# Patient Record
Sex: Male | Born: 1940 | Race: White | Hispanic: No | Smoking: Former smoker
Health system: Southern US, Community
[De-identification: ages and names within clinical notes are randomized; demographics above are authoritative.]

## PROBLEM LIST (undated history)

## (undated) DIAGNOSIS — Q613 Polycystic kidney, unspecified: Secondary | ICD-10-CM

## (undated) DIAGNOSIS — E559 Vitamin D deficiency, unspecified: Secondary | ICD-10-CM

## (undated) DIAGNOSIS — R0602 Shortness of breath: Secondary | ICD-10-CM

## (undated) DIAGNOSIS — J449 Chronic obstructive pulmonary disease, unspecified: Secondary | ICD-10-CM

## (undated) DIAGNOSIS — I2510 Atherosclerotic heart disease of native coronary artery without angina pectoris: Secondary | ICD-10-CM

## (undated) DIAGNOSIS — F32A Depression, unspecified: Secondary | ICD-10-CM

## (undated) DIAGNOSIS — C3491 Malignant neoplasm of unspecified part of right bronchus or lung: Secondary | ICD-10-CM

## (undated) DIAGNOSIS — M1990 Unspecified osteoarthritis, unspecified site: Secondary | ICD-10-CM

## (undated) DIAGNOSIS — G4730 Sleep apnea, unspecified: Secondary | ICD-10-CM

## (undated) DIAGNOSIS — I209 Angina pectoris, unspecified: Secondary | ICD-10-CM

## (undated) DIAGNOSIS — I10 Essential (primary) hypertension: Secondary | ICD-10-CM

## (undated) DIAGNOSIS — F329 Major depressive disorder, single episode, unspecified: Secondary | ICD-10-CM

## (undated) DIAGNOSIS — E785 Hyperlipidemia, unspecified: Secondary | ICD-10-CM

## (undated) HISTORY — PX: ANGIOPLASTY: SHX39

## (undated) HISTORY — DX: Malignant neoplasm of unspecified part of right bronchus or lung: C34.91

## (undated) HISTORY — DX: Chronic obstructive pulmonary disease, unspecified: J44.9

## (undated) HISTORY — PX: VAGOTOMY: SUR1431

## (undated) HISTORY — DX: Depression, unspecified: F32.A

## (undated) HISTORY — PX: HERNIA REPAIR: SHX51

## (undated) HISTORY — PX: SPINAL CORD STIMULATOR IMPLANT: SHX2422

## (undated) HISTORY — DX: Hyperlipidemia, unspecified: E78.5

## (undated) HISTORY — PX: GASTRECTOMY: SHX58

## (undated) HISTORY — PX: CHOLECYSTECTOMY: SHX55

## (undated) HISTORY — DX: Atherosclerotic heart disease of native coronary artery without angina pectoris: I25.10

## (undated) HISTORY — DX: Sleep apnea, unspecified: G47.30

## (undated) HISTORY — DX: Essential (primary) hypertension: I10

## (undated) HISTORY — PX: BACK SURGERY: SHX140

## (undated) HISTORY — DX: Major depressive disorder, single episode, unspecified: F32.9

## (undated) HISTORY — DX: Vitamin D deficiency, unspecified: E55.9

## (undated) HISTORY — PX: NISSEN FUNDOPLICATION: SHX2091

## (undated) HISTORY — PX: CARDIAC CATHETERIZATION: SHX172

## (undated) HISTORY — PX: CARPAL TUNNEL RELEASE: SHX101

## (undated) HISTORY — PX: APPENDECTOMY: SHX54

---

## 2006-08-19 ENCOUNTER — Encounter: Admission: RE | Admit: 2006-08-19 | Discharge: 2006-08-19 | Payer: Self-pay | Admitting: Specialist

## 2006-09-17 ENCOUNTER — Encounter: Admission: RE | Admit: 2006-09-17 | Discharge: 2006-09-17 | Payer: Self-pay | Admitting: Specialist

## 2006-10-07 ENCOUNTER — Encounter: Admission: RE | Admit: 2006-10-07 | Discharge: 2006-10-07 | Payer: Self-pay | Admitting: Specialist

## 2006-11-25 ENCOUNTER — Ambulatory Visit: Admission: RE | Admit: 2006-11-25 | Discharge: 2006-11-25 | Payer: Self-pay | Admitting: Specialist

## 2006-11-26 ENCOUNTER — Ambulatory Visit: Payer: Self-pay | Admitting: Cardiology

## 2006-11-30 ENCOUNTER — Ambulatory Visit: Payer: Self-pay

## 2006-12-02 ENCOUNTER — Ambulatory Visit: Payer: Self-pay | Admitting: Cardiology

## 2006-12-02 LAB — CONVERTED CEMR LAB
BUN: 15 mg/dL (ref 6–23)
Basophils Absolute: 0 10*3/uL (ref 0.0–0.1)
Basophils Relative: 0.4 % (ref 0.0–1.0)
CO2: 28 meq/L (ref 19–32)
Calcium: 9.6 mg/dL (ref 8.4–10.5)
Chloride: 110 meq/L (ref 96–112)
Creatinine, Ser: 0.9 mg/dL (ref 0.4–1.5)
Eosinophils Absolute: 0.5 10*3/uL (ref 0.0–0.6)
Eosinophils Relative: 5.3 % — ABNORMAL HIGH (ref 0.0–5.0)
GFR calc Af Amer: 109 mL/min
GFR calc non Af Amer: 90 mL/min
Glucose, Bld: 103 mg/dL — ABNORMAL HIGH (ref 70–99)
HCT: 36.2 % — ABNORMAL LOW (ref 39.0–52.0)
Hemoglobin: 12.1 g/dL — ABNORMAL LOW (ref 13.0–17.0)
INR: 0.9 (ref 0.8–1.0)
Lymphocytes Relative: 37.2 % (ref 12.0–46.0)
MCHC: 33.3 g/dL (ref 30.0–36.0)
MCV: 101 fL — ABNORMAL HIGH (ref 78.0–100.0)
Monocytes Absolute: 0.7 10*3/uL (ref 0.2–0.7)
Monocytes Relative: 8.6 % (ref 3.0–11.0)
Neutro Abs: 4.1 10*3/uL (ref 1.4–7.7)
Neutrophils Relative %: 48.5 % (ref 43.0–77.0)
Platelets: 364 10*3/uL (ref 150–400)
Potassium: 4.6 meq/L (ref 3.5–5.1)
Prothrombin Time: 11.1 s (ref 10.9–13.3)
RBC: 3.59 M/uL — ABNORMAL LOW (ref 4.22–5.81)
RDW: 13.8 % (ref 11.5–14.6)
Sodium: 143 meq/L (ref 135–145)
WBC: 8.5 10*3/uL (ref 4.5–10.5)
aPTT: 31.1 s — ABNORMAL HIGH (ref 21.7–29.8)

## 2006-12-06 ENCOUNTER — Ambulatory Visit: Payer: Self-pay | Admitting: Cardiovascular Disease

## 2006-12-06 ENCOUNTER — Inpatient Hospital Stay (HOSPITAL_BASED_OUTPATIENT_CLINIC_OR_DEPARTMENT_OTHER): Admission: RE | Admit: 2006-12-06 | Discharge: 2006-12-06 | Payer: Self-pay | Admitting: Cardiovascular Disease

## 2006-12-14 ENCOUNTER — Inpatient Hospital Stay (HOSPITAL_COMMUNITY): Admission: RE | Admit: 2006-12-14 | Discharge: 2006-12-20 | Payer: Self-pay | Admitting: Specialist

## 2010-08-05 NOTE — Assessment & Plan Note (Signed)
[name]                            CARDIOLOGY OFFICE NOTE   NAME:Troy [name]                           MRN:          [id number]  DATE:11/26/2006                            DOB:          [birth year]    CARDIOLOGY CONSULTATION   I was asked by Dr. Vira Browns to consult on Troy [name], a 70-  year-old gentleman with coronary artery disease who needs to be cleared  for spinal stenosis surgery.   Preoperative anesthesia gained a history that Troy [name] had had previous  coronary intervention and referred him here, along with Dr. Otelia Sergeant.   HISTORY OF PRESENT ILLNESS:  His history commenced in 1989.  He  presented with unstable angina, from his description, and subsequently  had an angioplasty in [place], [place].  Details are unknown.  He has  had several catheterizations since then, and his last catheterization  was by Dr. Ladona Ridgel in [facility], September 14, 1995.  His  catheterization then showed a 70% stenosis, but no intervention was  necessary.  He has done well since that time, now 11 years since.   He is having no exertional angina, though he admits to not getting  around very well with his spinal stenosis.  He has only taken  nitroglycerin a couple of times, and that was when his wife died 2 years  ago and he was emotionally upset.   PAST MEDICAL HISTORY:  In addition to the above, he is allergic to  DOXEPIN and ISORDIL.   CURRENT MEDICATIONS:  1. Aspirin 325 a day.  2. Diazepam 10 mg p.o. t.i.d.  3. Remeron 30 mg nightly.  4. Lovastatin 20 mg a day.   He does not smoke.  He quit about 10 years ago.  He does not drink.   He has had multiple surgeries, which are listed on an enclosed sheet,  typed out by him.   His family history is really noncontributory.   His social history, he is retired on disability since 1998.  He is  widowed.  He does not have any children.   REVIEW OF SYSTEMS:  He has a history of stomach and bowel  disorder,  history of an ulcer, history of hiatal hernia, history of arthritis,  history of reflux, history of depression.   EXAM:  His blood pressure is 140/90, pulse 84 and regular.  He is normal  sinus rhythm.  His EKG is normal.  He is 6 feet tall, weighs 194 pounds.  HEENT:  Normocephalic, atraumatic.  PERRLA.  Extraocular muscles are  intact.  Sclerae clear.  SKIN:  Pale.  NECK:  Supple.  Carotids are full without bruits.  There is no JVD.  Thyroid is not enlarged.  Trachea is midline.  LUNGS:  Clear to auscultation.  HEART:  Reveals a regular rate and rhythm without gallop.  ABDOMEN:  Shows multiple incisions.  There is no midline bruit.  There  is no organomegaly or tenderness.  EXTREMITIES:  No cyanosis, clubbing, or edema.  Pulses are present.  NEURO:  Grossly intact.  SKIN:  Unremarkable.  ASSESSMENT:  1. Coronary artery disease with history of intervention in 1989.  Per      the patient, he has a 70% residual lesion by catheterization in      1997.  We have no records.  He is currently having no angina, but      is very limited in his mobility with his spinal stenosis.  He has      not had an objective assessment of his coronary artery disease in      11 years.  2. Hyperlipidemia on lovastatin.   PLAN:  Adenosine rest-stress Myoview to clear for surgery.     Thomas C. Daleen Squibb, MD, [facility]  Electronically Signed    TCW/MedQ  DD: 11/26/2006  DT: 11/26/2006  Job #: [id number]   cc:   Kerrin Champagne, M.D.

## 2010-08-05 NOTE — Op Note (Signed)
NAME:  Troy [name], Troy [name] NO.:  [id number]   MEDICAL RECORD NO.:  [id number]          PATIENT TYPE:  INP   LOCATION:  5008                         FACILITY:  [facility]   PHYSICIAN:  Kerrin Champagne, M.D.   DATE OF BIRTH:  [birth year]   DATE OF PROCEDURE:  12/14/2006  DATE OF DISCHARGE:                               OPERATIVE REPORT   PREOPERATIVE DIAGNOSIS:  Severe lumbar spinal stenosis L3-4, L4-5, mild  L2-3 with degenerative grade 1 spondylolisthesis at L3-4.   POSTOPERATIVE DIAGNOSES:  1. Severe lumbar spinal stenosis L4-5 with bilateral nerve root      entrapment.  2. Bilateral L4 nerve root entrapment within the foramen at L4-5 and      bilateral L3 nerve root entrapment within the foramen of L3-4.  3. Lateral recess stenosis effecting bilateral L4 nerve roots.  4. Degenerative grade 1 spondylolisthesis, L3-4.   PROCEDURES:  1. Central decompressive laminectomy L3-4, L4-5 carried up to the L2-3      level with bilateral lateral recess decompression at L3-4, L2-3 and      L4-5.  2. Bilateral nerve root decompression at L5.  3. Bilateral nerve root decompression at L4.  4. Bilateral nerve root decompression at L3.  5. Posterolateral fusion L3-4 for spondylolisthesis utilizing local      bone graft internal fixation L3-4 using Monarch pedicle screws and      rods, each 7 mm x 45 mm.   SURGEON:  Kerrin Champagne, M.D.   ASSISTANT:  [name], P.A.   ANESTHESIA:  General via oral tracheal intubation, Dr. Arta Bruce,  supplemented with local infiltration of Marcaine 0.5% with 1:200,000  epinephrine 20 mL.   DRAINS:  Foley to straight drain, Hemovac x1.   The patient had intraoperative neural monitoring performed with all soft  tissue resistance through the screws measured at greater than 30.  The  patient had Cell Saver used during the case, however, estimated blood  loss was 300 mL.  No Cell Saver blood was returned to the patient.   HISTORY OF  PRESENT ILLNESS:  A 70 year old male who has a history of  progressive increasing neurogenic claudication, pain into the anterior  thighs to the level of the knees and anterior groin discomfort.  The  patient has undergone extensive evaluation.  He has had ESIs with only  temporization of his discomfort.  His studies have indicated severe  lumbar spinal stenosis at L3-4, L4-5, mild at L2-3, with degenerative  slip at the L3-4 level.  He is brought to the operating room to undergo  the aforementioned procedure.  He has neurogenic claudication to the  point where he is incapable of walking any large distances beyond to the  mail box.   INTRAOPERATIVE FINDINGS:  Severe lumbar spinal stenosis centrally at L4-  5 effecting the L5 nerve roots bilaterally as well as the L4 nerve roots  within the neuroforamen.  Moderately severe lumbar spinal stenosis at L3-  5 effecting the bilateral L4 nerve roots within the later recess.  Mild  lumbar spinal stenosis at L2-3.  DESCRIPTION OF PROCEDURE:  After adequate general anesthesia, the  patient had a Foley catheter placed, standard preoperative antibiotics.  He was rolled to a prone position using Jackson spine table.  All  pressure points well padded.  He had a standard prep with DuraPrep  solution from the mid dorsal spine to the sacral segments, was draped in  the usual manner.  Iodine body drape was used.  The expected level of  the L4 marked at the superior edge of the iliac crest.  The iliac crest  was marked.  A midline incision made through the skin and subcutaneous  layers extended from the mid portion of L2 to the L5 level in the  midline.  Electrocautery used to control bleeders.  The incision carried  sharply down to the lumbodorsal fascia.  Incision carried over both  sides of the spinous process at L5, L4, L3 and L2 . clamps placed on the  spinous process of L5 and at L4.  These were marked for continued  identification.   Cautery was  used to incise the supraspinous ligament for continued  identification of these two segments.  The paralumbar muscles were then  divided of the patient's spinous process of lamina using sharp  dissection, electrocautery as well as Cobb elevators.  Facet capsule was  preserved at the L4-5 level, L2-3 level bilaterally.  The facet capsules  were incised.  Dissection carried laterally at the L3-4 level in order  to identify the transverse process of L4 laterally.  These were exposed  bilaterally and preserving the facet capsules in the L2-3 level,  dissection was carried laterally exposing the transverse process of L3  bilaterally.  Incision was carried superiorly an additional 1 level up  to the L1 segment posteriorly in order to allow for lateral  visualization of the transverse processes.  The posterolateral gutters  were packed with sponges.  The sponges were then removed and the Viper  retractor placed deep in order to expose the area for posterolateral  fusion at L3-4.  These areas again then packed with sponges.  Leksell  rongeur then used to removed the spinous process of L4 and of L3  centrally inferior 50% of the L2 spinous processes as well as 30-40% of  the L5.  These were taken down to the lamina posteriorly.  The lamina  were then thinned centrally using Leksell rongeur.  Soft tissue  attachments over the lamina were carefully debrided and electrocautery  used to control bleeders.  Osteotome was then used to perform medial  facetectomies at the L4-5 and L3-4.  Removing bone down to the superior  aspect from lamina of L5 to the L4-5 and at L4 at the L3-4 level.  The  posterior aspect of the lamina were then carefully thinned using  osteotomes as well.  Central portions of lamina at the L4 level were  then resected using 2 and 3-mm Kerrisons.  Complete central laminectomy  was carried out, removing bone out to about 2-3 mm centrally and then  this was carried then to the L3-4  level, removing the central portions  of the lamina at the L3 level to its superior neural arch and this was  then resected using 2 and 3-mm Kerrisons.  A cottonoid placed beneath  the neural arch during this removal, the very superior edge of L4 and of  L3.  Osteotome was then used to further open the central laminotomy,  removing medial portions of the L4 inferior articular process  bilaterally up to about 30-40% that was resected, both sides, in order  to allow for visualization of the superior articular process of the L5  bilaterally.   Next, the ligamentum flavum insertion in the superior aspect of the  lamina at L5 was then resected using 2 and 3-mm Kerrisons.  This was  then carried out laterally and foraminotomy performed over both L5 nerve  roots.  The medial aspect of the facet over the L5 pedicle was then  identified.  The medial portion of the pedicle identified.  Osteotome  then used to perform osteotomy of the medial portion of the superior  articular process of L5 bilaterally, then this was resected  decompressing the lateral recesses and the attachment of the ligamentum  flavum reflected portion here bilaterally.  This decompressed nicely at  the L5 nerve roots both sides.  The most superior portions of the  superior articular process of L5 were resected bilaterally,  decompressing the neural foramen over the L4 nerve roots, both sides.  Loupe magnification and head lamp were used during this portion of the  procedure.  The pars areas preserved over 8 mm both sides, L4 and L3.  Recess area for the L4 nerve root was decompressed along with portions  of the pars undercut to allow for the nerve root to exit out the L4  neural foramen both side without compression due to ligamentum flavum  reflected here and the superiormost portions of the superior articular  process of L5.  This was then carried up superiorly to the L3-4 level  where again the medial portion of the pedicle  of L4 was identified.  An  osteotomy performed in the superior articular process of L4 both sides.  This was resected, decompressing the lateral recesses at the L3-4 level  and the L4 nerve roots bilaterally.  The neural foramen of the L3 nerve  roots was carefully decompressed, Loupe magnification and headlight,  using a hockey stick nerve probe, passed out the neural foramen  protecting the nerve root and then resecting the overlying reflected  portions of ligamentum flavum at the medial aspect of the L3-4 facet and  the superior articular process at L4 was resected as well.  Hockey stick  neural probe could then easily be passed out the neural foramen at L3  and L4 and L5 nerve roots.  The medial aspect of the pars area was also  osteotomized allowing at least 8 mm of remaining pars at the L3-4 level  just above the L3 neural foramen.  Continuing superiorly, the neural  arch of the L3 lamina was resected superiorly, protecting the thecal sac  with a single cottonoid and then decompressing at the L2-3 level by  excising the ligamentum flavum that hypertrophic over the medial aspect  of the facets at the L2-3 level.  Minimal amount of facetectomy was  performed at this segment less than 5% each side in order to decompress  the lateral recess and to excise the ligamentum flavum here that was  hypertrophic.  The hockey stick neural probe again able to be passed out  bilateral L3, bilateral L4, bilateral L5 neural foramen without evidence  of further compression.  Attention was then turned to performing the  posterior-lateral fusion at the L3-4 level.   Intraoperative C-arm fluoroscopy was used to carefully determine correct  position alignment.  The awl inserted into the lateral portion of the  pedicle at the L3 level intersection of the transverse process of L3  with the lateral aspect of pedicle at L3.  After determining this area,  the awl would be used to make an initial entry point.   A pedicle probe  was then passed down to the pedicle at L3 on the left side bluntly at  least 45 mm.  Ball tip probe used to probe the channel within the  pedicle ensuring its patency and no sign of broaching of the cortex  here.  Tapping performed using a 6.5 tap and a 7.0 screw by 45 mm was  placed on the left side at L3.  Note that decortication was carried out  over the transverse process of L3 and local bone graft that had been  harvested was placed out posterolateral prior to the placement of the  screw.  Similarly, then the awl was used to make an entry point into the  intersection of the transverse process of L4 with the lateral aspect of  the pedicle at L4.  Intraoperative C-arm fluoroscopy used to ascertain  the correct position alignment of this all and then after the initial  opening into the lateral aspect of the pedicle, the pedicle probe was  then used to probe the channel within the central portions of the  pedicle with the correct degree of convergence and alignment.  Tapping  was performed using a 6.5 tap.  Ball tip probe used to probe the channel  to ensure patency of the pedicle opening and no sign of broaching of  cortex here.  A 45 mm by 7.0 screw was chosen.  Decortication carried  out over the transverse process.  Local bone graft applied here.  The  screw was then placed without difficulty.   Attention then turned to the right side, where similarly the awl used to  make an entry point over the lateral aspect of the pedicle of L3 at the  intersection of the transverse process of L3 with the lateral aspect of  the pedicle of L3.  C-arm used to ascertain the correct position  alignment and then hand held pedicle finger used to probe the pedicle  channel.  Depth at 45 mm.  Ball tip probe used to ensure patency of this  channel, no sign of broach.  Tapping performed using a 6.5 tap.  There  was a 7.0 x 45-mm screw placed on the right side at the L3 level  following  decortication with the high speed bur at the transverse  process of L3.  Bone graft was then applied using local bone graft over  the transverse process of L3 and after probing the pedicle channel to  ensure its patency and no sign of broach, the pedicle screw was then  placed down at L3.  Similarly, then the awl was used to make an initial  entry point with the lateral aspect of the pedicle of L4 intersection of  this transverse process with the lateral aspect of the pedicle of L4.  Once the hole was then removed and the pedicle probed, then used a probe  pedicle channel to 45 mm.  A ball tip probe used to ensure patency of  the opening within the pedicle ensuring no sign of broaching cortex.  Tapping performed using a 6.5 tap.  Then a 7.0 x 45 mm screw was placed  on the right side at L4.  Correct degree of convergence and alignment.  With this then, each of the fasteners were then loosened carefully using  the head breaker provided.  Each of  the pedicle screws were then tested  for the soft tissue resistance, each measured greater than 30.  With  this completed, then 40-mm length rods were then placed within the  fasteners to the screws both sides and caps were placed.  Maintaining  the position alignment of the spine as is and the patient positioned in  extension of the back, the pedicle screw fasteners were then tightened  to the rods by tightening the caps to 80 foot pounds.  The antirotation  device was used to prevent rotation of the rod fastener interface with  torquing.  This was done over each of the 4 screws.  Permanent C-arm  images standard AP and lateral planes documenting position alignment of  the pedicle screws.  Irrigation was then carried out.  Carefully the  open laminotomy site was again probed to ensure patency of the neural  foramen for the L3, L4, L5 nerve roots bilaterally.  Each were well  decompressed.  Following irrigation, bone wax was applied to the medial   borders of the facetectomized sites at each level.  Any remaining bone  graft was then placed posterolaterally, both left and right sides, to re-  enforce current bone graft at the areas.  A single medium Hemovac drain  was placed in the depth of the incision after placing Gelfoam centrally  overlying the posterior laminotomy region.  The paralumbar muscles then loosely approximated in the midline with interrupted #1 Vicryl sutures.  The lumbodorsal fascia approximated in the midline with interrupted #1  Vicryl sutures reattaching to the supraspinous ligament and interspinous  ligament were possible.  Deep subcutaneous layers approximated with  interrupted #1 and 0 Vicryl sutures, more superficial layers with  interrupted 0 and 2-0 Vicryl sutures and the skin closed with a running  subcutaneous stitch of 4.0 Vicryl.  Dermabond used to approximate the  skin edges, 4 x 4's, ABD pads fixed to the skin with Hypafix tape.  The  patient was then returned to a supine position, reactivated, extubated,  returned to the recovery room in satisfactory condition.  All instrument  and sponge counts were correct.      Kerrin Champagne, M.D.  Electronically Signed     JEN/MEDQ  D:  12/14/2006  T:  12/14/2006  Job:  [id number]

## 2010-08-05 NOTE — Cardiovascular Report (Signed)
NAME:  Troy [name], Troy [name] NO.:  [id number]   MEDICAL RECORD NO.:  [id number]          PATIENT TYPE:  OIB   LOCATION:  1963                         FACILITY:  [facility]   PHYSICIAN:  Peter C. Eden Emms, MD, FACCDATE OF BIRTH:  May 13, 1940   DATE OF PROCEDURE:  DATE OF DISCHARGE:                            CARDIAC CATHETERIZATION   PROCEDURE:  Coronary arteriography.   INDICATIONS:  A 65-year patient with previous coronary artery disease,  documentation poor as it was done at outside hospitals, and preoperative  clearance for spinal stenosis surgery with Dr. Otelia Sergeant.   Adenosine Myoview suggesting small area of inferior wall ischemia.   Cine catheterization done with 4-French catheters from right femoral  artery.   The patient tolerated the procedure well.  Standard catheters were used  for engagement of the coronaries.   The left main coronary artery was normal.  The left anterior descending  artery was somewhat heavily calcified.  There was 20-30% tubular disease  proximally.  Mid vessel had 20% multiple discrete lesions.  First and  second diagonal branches were normal.  There were no critical stenoses  in the LAD territory.  The circumflex coronary artery was nondominant.  Proximal and mid vessel were normal.  First obtuse marginal branch was  normal.  There was a 30-40% lesion in the AV groove branch after the  takeoff of the first obtuse marginal branch.   The right coronary artery was heavily calcified throughout its course  but there were no discrete significant stenoses.  There are 20-30%  multiple discrete lesions in the proximal mid and distal vessel.  PDA  and PLA were normal.   RAO ventriculography:  Ventriculography was normal.  EF was 60%.  There  is no gradient across the aortic valve and no MR.   LV pressure was 125/9, aortic pressure was 125/65.   IMPRESSION:  The patient is cleared for spinal stenosis surgery with Dr.  Otelia Sergeant.  He has no critical  coronary stenoses.  Continued risk factor  modification is in order.  He will continue his aspirin and statin  therapy.   He tolerated the procedure well.      Noralyn Pick. Eden Emms, MD, [facility]  Electronically Signed     PCN/MEDQ  D:  12/06/2006  T:  12/06/2006  Job:  [id number]

## 2010-08-08 NOTE — Discharge Summary (Signed)
Troy [name], SIZELOVE NO.:  [id number]   MEDICAL RECORD NO.:  [id number]          PATIENT TYPE:  INP   LOCATION:  5008                         FACILITY:  [facility]   PHYSICIAN:  Troy [name], M.D.   DATE OF BIRTH:  04/17/1940   DATE OF ADMISSION:  12/14/2006  DATE OF DISCHARGE:  12/20/2006                               DISCHARGE SUMMARY   ADMISSION DIAGNOSIS:  1. Severe lumbar spinal stenosis L3-4, L4-5, mild L2-3 with      degenerative grade 1 spondylolisthesis at L3-4.  2. Gastroesophageal reflux disease.  3. Chronic obstructive pulmonary disease.  4. Depression  5. Coronary artery disease.  6. Sleep apnea diagnosed and C Pap prescribed however the patient does      not use.  7. Migraine headaches.   DISCHARGE DIAGNOSIS:  1. Severe lumbar spinal stenosis L4-5 with bilateral nerve root      entrapment.  2. Bilateral L4 nerve root entrapment within the foramen at L4-5 and      bilateral L3 nerve root entrapment within the foramen of L3-4.  3. Lateral recess stenosis affecting bilateral L4 nerve roots.  4. 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  5. Gastroesophageal reflux disease.  6. Chronic obstructive pulmonary disease.  7. Depression  8. Coronary artery disease.  9. Sleep apnea diagnosed and C Pap prescribed however the patient does      not use.  10.Migraine headaches.  11.Posthemorrhagic anemia.   PROCEDURE:  On December 14, 2006 the patient underwent:  1. Central decompressive laminectomy L3-4, L4-5 carried up to the L2-3      level with bilateral lateral recess decompression at the L3-4, L2-3      and L4-5 levels.  2. Bilateral nerve root decompression at L5-L4 and L3.  3. Posterolateral fusion L3-4 for spondylolisthesis utilizing local      bone graft and Monarch pedicle screws and rods.  This was performed      by Dr. Otelia Sergeant assisted by Maud Deed [facility] under general      anesthesia.   CONSULTATIONS:  None.   BRIEF HISTORY:  Patient  is a 70 year old male with progressive  increasing neurogenic claudication.  Extensive evaluation has  demonstrated severe lumbar spinal stenosis at L3-4, L4-5 and mild at L2-  3 with degenerative spondylolisthesis at the L3-4 level.  ESI in  September iced his discomfort, but no longer giving relief.  He is  having difficulty with ambulation.  It was felt he would require  surgical intervention and was admitted for the procedure as stated  above.   BRIEF HOSPITAL COURSE:  The patient tolerated procedure under general  anesthesia without complications.  Postoperatively neurovascular motor  function of the lower extremities was noted to be intact.  The patient  was fitted with Aspen LSO by [employer] and was started on physical therapy  for and ambulation and gait training, wearing the brace anytime out of  bed.  He was able to use a rolling walker.  He initially was slow to  progress and it was felt he may require skilled nursing facility for  further inpatient rehabilitation.  However, he did not wish to go this  route and worked very diligently at increasing his activity level.  At  discharge he was able to ambulate as much as 200 feet.  He was able to  also show ability to don and doff the brace independently.  The patient  with known history of constipation was watched very closely for ileus.  Fortunately he was able to have flatus and bowel movement and his diet  was advanced at that time.  The patient was able to void after Foley  catheter discontinued.  He was weaned from IV analgesics to p.o.  analgesics.  The patient complained of right knee pain which was felt to  be related to osteoarthritis.  Fortunately this was relieved with pain  medication.  On December 20, 2006 he was felt stable for discharge.   PERTINENT LABORATORY VALUES:  On admission CBC with hemoglobin and  hematocrit 12.7, 37.2.  At discharge hemoglobin and hematocrit 9.3 and  27.5.  Chemistry studies on admission,  glucose 101.  Repeat on 09/24  with potassium 3.3.  After supplementation, potassium return to 3.7.  Uric acid on 09/25 was noted to be 3.8.   EKG on admission showed normal sinus rhythm, no previous EKGs for  comparison.   PLAN:  The patient was discharged to home and will be assisted by  family.  He will be allowed to shower.  No lifting, bending, twisting.  Increase his activity slowly utilizing a walker and his brace for  ambulation.  The patient will follow up with Dr. Otelia Sergeant 2 weeks from  surgery.   MEDICATIONS AT DISCHARGE:  1. OxyContin 20 mg every 12 hours.  2. Tylox, one to two every 4 to 6 hours as needed for pain,  3. Celebrex 200 mg daily.  4. Robaxin 500 mg one every 8 hours as needed for spasm.   CONDITION ON DISCHARGE:  Stable.      Troy [name], P.A.      Troy [name], M.D.  Electronically Signed    SMV/MEDQ  D:  03/30/2007  T:  03/30/2007  Job:  [id number]

## 2011-01-01 LAB — HEMOGLOBIN AND HEMATOCRIT, BLOOD
HCT: 27.1 — ABNORMAL LOW
HCT: 27.5 — ABNORMAL LOW
Hemoglobin: 11.4 — ABNORMAL LOW
Hemoglobin: 9.2 — ABNORMAL LOW
Hemoglobin: 9.3 — ABNORMAL LOW

## 2011-01-01 LAB — POTASSIUM: Potassium: 3.7

## 2011-01-01 LAB — CBC
Hemoglobin: 12.7 — ABNORMAL LOW
MCHC: 33.8
MCV: 99.3
RBC: 3.78 — ABNORMAL LOW
WBC: 10.2

## 2011-01-01 LAB — BASIC METABOLIC PANEL
BUN: 5 — ABNORMAL LOW
CO2: 25
CO2: 28
Calcium: 9.7
Chloride: 104
GFR calc Af Amer: >60
GFR calc non Af Amer: >60
Glucose, Bld: 116 — ABNORMAL HIGH
Potassium: 3.3 — ABNORMAL LOW
Sodium: 137
Sodium: 139

## 2011-01-01 LAB — CROSSMATCH: Antibody Screen: NEGATIVE

## 2011-01-01 LAB — URIC ACID: Uric Acid, Serum: 3.8

## 2011-01-02 LAB — TYPE AND SCREEN: ABO/RH(D): O POS

## 2011-01-02 LAB — CBC
HCT: 38.4 — ABNORMAL LOW
Platelets: 346
RDW: 14.8 — ABNORMAL HIGH
WBC: 12.1 — ABNORMAL HIGH

## 2011-01-02 LAB — BASIC METABOLIC PANEL
BUN: 14
Calcium: 9.6
Creatinine, Ser: 0.7
GFR calc non Af Amer: >60
Glucose, Bld: 94
Potassium: 4

## 2013-02-24 ENCOUNTER — Encounter: Payer: Self-pay | Admitting: Cardiovascular Disease

## 2013-02-24 ENCOUNTER — Encounter (HOSPITAL_COMMUNITY): Payer: Self-pay | Admitting: Pharmacist

## 2013-02-24 ENCOUNTER — Encounter: Payer: Self-pay | Admitting: *Deleted

## 2013-02-24 ENCOUNTER — Ambulatory Visit (INDEPENDENT_AMBULATORY_CARE_PROVIDER_SITE_OTHER): Payer: Medicare Other | Admitting: Cardiovascular Disease

## 2013-02-24 ENCOUNTER — Ambulatory Visit (INDEPENDENT_AMBULATORY_CARE_PROVIDER_SITE_OTHER)
Admission: RE | Admit: 2013-02-24 | Discharge: 2013-02-24 | Disposition: A | Discharge status: Home / Self Care | Payer: Medicare Other | Source: Ambulatory Visit | Attending: Cardiovascular Disease | Admitting: Cardiovascular Disease

## 2013-02-24 ENCOUNTER — Other Ambulatory Visit: Payer: Self-pay | Admitting: Cardiovascular Disease

## 2013-02-24 VITALS — BP 140/82 | HR 80 | Wt 173.0 lb

## 2013-02-24 DIAGNOSIS — F329 Major depressive disorder, single episode, unspecified: Secondary | ICD-10-CM | POA: Insufficient documentation

## 2013-02-24 DIAGNOSIS — R079 Chest pain, unspecified: Secondary | ICD-10-CM | POA: Insufficient documentation

## 2013-02-24 DIAGNOSIS — E785 Hyperlipidemia, unspecified: Secondary | ICD-10-CM

## 2013-02-24 DIAGNOSIS — I10 Essential (primary) hypertension: Secondary | ICD-10-CM | POA: Insufficient documentation

## 2013-02-24 DIAGNOSIS — G4730 Sleep apnea, unspecified: Secondary | ICD-10-CM | POA: Insufficient documentation

## 2013-02-24 DIAGNOSIS — E559 Vitamin D deficiency, unspecified: Secondary | ICD-10-CM | POA: Insufficient documentation

## 2013-02-24 DIAGNOSIS — Z01810 Encounter for preprocedural cardiovascular examination: Secondary | ICD-10-CM

## 2013-02-24 LAB — BASIC METABOLIC PANEL
BUN: 15 mg/dL (ref 6–23)
Chloride: 105 mEq/L (ref 96–112)
Creatinine, Ser: 0.9 mg/dL (ref 0.4–1.5)
Glucose, Bld: 89 mg/dL (ref 70–99)
Potassium: 4 mEq/L (ref 3.5–5.1)

## 2013-02-24 LAB — CBC WITH DIFFERENTIAL/PLATELET
Basophils Absolute: 0 10*3/uL (ref 0.0–0.1)
Basophils Relative: 0.3 % (ref 0.0–3.0)
Eosinophils Relative: 1.6 % (ref 0.0–5.0)
Hemoglobin: 11.8 g/dL — ABNORMAL LOW (ref 13.0–17.0)
Lymphocytes Relative: 23.3 % (ref 12.0–46.0)
MCHC: 32.5 g/dL (ref 30.0–36.0)
MCV: 85.1 fl (ref 78.0–100.0)
Neutro Abs: 6.1 10*3/uL (ref 1.4–7.7)
Neutrophils Relative %: 65.9 % (ref 43.0–77.0)
RBC: 4.28 Mil/uL (ref 4.22–5.81)
WBC: 9.3 10*3/uL (ref 4.5–10.5)

## 2013-02-24 LAB — PROTIME-INR: Prothrombin Time: 11.2 s (ref 10.2–12.4)

## 2013-02-24 MED ORDER — ISOSORBIDE MONONITRATE ER 30 MG PO TB24: 15.0000 mg | ORAL_TABLET | Freq: Every day | ORAL | Status: DC

## 2013-02-24 MED ORDER — CARVEDILOL 3.125 MG PO TABS: 3.1250 mg | ORAL_TABLET | Freq: Two times a day (BID) | ORAL | Status: DC

## 2013-02-24 NOTE — Patient Instructions (Signed)
Your physician has requested that you have a cardiac catheterization. Cardiac catheterization is used to diagnose and/or treat various heart conditions. Doctors may recommend this procedure for a number of different reasons. The most common reason is to evaluate chest pain. Chest pain can be a symptom of coronary artery disease (CAD), and cardiac catheterization can show whether plaque is narrowing or blocking your heart's arteries. This procedure is also used to evaluate the valves, as well as measure the blood flow and oxygen levels in different parts of your heart. For further information please visit [email or web address] Please follow instruction sheet, as given.  Your physician has recommended you make the following change in your medication:  START   IMDUR  15 MG  1  EVERY DAY   AND  CARVEDILOL  3.125 MG  TWICE  DAILY  Your physician recommends that you return for lab work in: TODAY  BMET   CBC WITH DIFF INR  A chest x-ray takes a picture of the organs and structures inside the chest, including the heart, lungs, and blood vessels. This test can show several things, including, whether the heart is enlarges; whether fluid is building up in the lungs; and whether pacemaker / defibrillator leads are still in place. TODAY

## 2013-02-24 NOTE — Assessment & Plan Note (Signed)
Worrisome for unstable angina Start imdur and coreg  Arrange cath for next week.  ASA  Labs and CXR today Risks discussed patient willing to proceed Last cath from RFA but may do radially this time

## 2013-02-24 NOTE — Progress Notes (Signed)
Patient ID: Troy [name], male   DOB: [birth year], 72 y.o.   MRN: [id number] Referred by Cletis Athens NP in [place] for chest pains His history commenced in 1989. He presented with unstable angina, from his description, and subsequently had an angioplasty in [place], [place]. Details are unknown. He has had several catheterizations since then, and his last catheterization was by Dr. Ladona Ridgel in [facility], September 14, 1995. His catheterization then showed a 70% stenosis, but no intervention was necessary Seen for preiop clearence in 2008  Cath 12/06/2006 with no critical CAD  The left main coronary artery was normal. The left anterior descending  artery was somewhat heavily calcified. There was 20-30% tubular disease  proximally. Mid vessel had 20% multiple discrete lesions. First and  second diagonal branches were normal. There were no critical stenoses  in the LAD territory. The circumflex coronary artery was nondominant.  Proximal and mid vessel were normal. First obtuse marginal branch was  normal. There was a 30-40% lesion in the AV groove branch after the  takeoff of the first obtuse marginal branch.  The right coronary artery was heavily calcified throughout its course  but there were no discrete significant stenoses. There are 20-30%  multiple discrete lesions in the proximal mid and distal vessel. PDA  and PLA were normal.  RAO ventriculography: Ventriculography was normal. EF was 60%. There  is no gradient across the aortic valve and no MR.  Referred by [facility] center for increasing SSCP worrisome for unstable angina. Getting reproducible SSCP with exertion Taken 3-4 nitro past month when pain has not Gone away with relief No rest pain Has had vagotomy for ulcers and multiple stomach wraps for GERD but no GI overtones to his pains.  Frequency of pains have increased the last 6 weeks  ROS: Denies fever, malais, weight loss, blurry vision, decreased visual acuity, cough,  sputum, SOB, hemoptysis, pleuritic pain, palpitaitons, heartburn, abdominal pain, melena, lower extremity edema, claudication, or rash.  All other systems reviewed and negative   General: Affect appropriate Healthy:  appears stated age HEENT: normal Neck supple with no adenopathy JVP normal no bruits no thyromegaly Lungs clear with no wheezing and good diaphragmatic motion Heart:  S1/S2 no murmur,rub, gallop or click PMI normal Abdomen: benighn, BS positve, multiple scars form renal cyst removal, GB and vagotomy with esophageal wraps  no bruit.  No HSM or HJR Distal pulses intact with no bruits No edema Neuro non-focal Skin warm and dry No muscular weakness  Medications Current Outpatient Prescriptions  Medication Sig Dispense Refill  . HYDROcodone-acetaminophen (NORCO) 10-325 MG per tablet prn      . montelukast (SINGULAIR) 10 MG tablet Take 1 tablet by mouth daily.      . traMADol (ULTRAM) 50 MG tablet prn      . zolpidem (AMBIEN) 10 MG tablet Take 1 tablet by mouth daily.       No current facility-administered medications for this visit.    Allergies Augmentin and Flagyl  Family History: Family History  Problem Relation Age of Onset  . Cancer Mother     stomach    Social History: History   Social History  . Marital Status: Widowed    Spouse Name: N/A    Number of Children: N/A  . Years of Education: N/A   Occupational History  . Not on file.   Social History Main Topics  . Smoking status: Never Smoker   . Smokeless tobacco: Not on file  . Alcohol  Use: No  . Drug Use: No  . Sexual Activity: Not on file   Other Topics Concern  . Not on file   Social History Narrative  . No narrative on file    Electrocardiogram:  NSR normal ECG rate 80  Assessment and Plan

## 2013-02-24 NOTE — Assessment & Plan Note (Signed)
Cholesterol is at goal.  Continue current dose of statin and diet Rx.  No myalgias or side effects.  F/U  LFT's in 6 months. No results found for this basename: LDLCALC   On statin labs done in [place]

## 2013-02-24 NOTE — Assessment & Plan Note (Signed)
Well controlled.  Continue current medications and low sodium Dash type diet.    

## 2013-02-27 ENCOUNTER — Other Ambulatory Visit: Payer: Self-pay | Admitting: *Deleted

## 2013-02-27 DIAGNOSIS — R911 Solitary pulmonary nodule: Secondary | ICD-10-CM

## 2013-02-28 ENCOUNTER — Ambulatory Visit (INDEPENDENT_AMBULATORY_CARE_PROVIDER_SITE_OTHER)
Admission: RE | Admit: 2013-02-28 | Discharge: 2013-02-28 | Disposition: A | Discharge status: Home / Self Care | Payer: Medicare Other | Source: Ambulatory Visit | Attending: Cardiovascular Disease | Admitting: Cardiovascular Disease

## 2013-02-28 DIAGNOSIS — R911 Solitary pulmonary nodule: Secondary | ICD-10-CM

## 2013-03-01 ENCOUNTER — Ambulatory Visit (HOSPITAL_COMMUNITY)
Admission: RE | Admit: 2013-03-01 | Discharge: 2013-03-01 | Disposition: A | Discharge status: Home / Self Care | Payer: Medicare Other | Source: Ambulatory Visit | Attending: Cardiovascular Disease | Admitting: Cardiovascular Disease

## 2013-03-01 ENCOUNTER — Encounter (HOSPITAL_COMMUNITY)
Admission: RE | Disposition: A | Discharge status: Home / Self Care | Payer: Self-pay | Source: Ambulatory Visit | Attending: Cardiovascular Disease

## 2013-03-01 DIAGNOSIS — I2584 Coronary atherosclerosis due to calcified coronary lesion: Secondary | ICD-10-CM | POA: Insufficient documentation

## 2013-03-01 DIAGNOSIS — I2510 Atherosclerotic heart disease of native coronary artery without angina pectoris: Secondary | ICD-10-CM

## 2013-03-01 DIAGNOSIS — R079 Chest pain, unspecified: Secondary | ICD-10-CM

## 2013-03-01 DIAGNOSIS — K219 Gastro-esophageal reflux disease without esophagitis: Secondary | ICD-10-CM | POA: Insufficient documentation

## 2013-03-01 DIAGNOSIS — Z79899 Other long term (current) drug therapy: Secondary | ICD-10-CM | POA: Insufficient documentation

## 2013-03-01 HISTORY — PX: LEFT HEART CATHETERIZATION WITH CORONARY ANGIOGRAM: SHX5451

## 2013-03-01 SURGERY — LEFT HEART CATHETERIZATION WITH CORONARY ANGIOGRAM
Anesthesia: LOCAL

## 2013-03-01 MED ORDER — SODIUM CHLORIDE 0.9 % IJ SOLN: 3.0000 mL | Freq: Two times a day (BID) | INTRAMUSCULAR | Status: DC

## 2013-03-01 MED ORDER — OXYCODONE-ACETAMINOPHEN 5-325 MG PO TABS
1.0000 | ORAL_TABLET | ORAL | Status: DC | PRN
  Administered 2013-03-01: 2 via ORAL

## 2013-03-01 MED ORDER — MIDAZOLAM HCL 2 MG/2ML IJ SOLN
INTRAMUSCULAR | Status: AC
  Filled 2013-03-01: qty 2

## 2013-03-01 MED ORDER — SODIUM CHLORIDE 0.45 % IV SOLN: INTRAVENOUS | Status: AC

## 2013-03-01 MED ORDER — FENTANYL CITRATE 0.05 MG/ML IJ SOLN
INTRAMUSCULAR | Status: AC
  Filled 2013-03-01: qty 2

## 2013-03-01 MED ORDER — DIAZEPAM 5 MG PO TABS: 5.0000 mg | ORAL_TABLET | ORAL | Status: DC | PRN

## 2013-03-01 MED ORDER — ASPIRIN 81 MG PO CHEW: 81.0000 mg | CHEWABLE_TABLET | Freq: Every day | ORAL | Status: DC

## 2013-03-01 MED ORDER — HEPARIN (PORCINE) IN NACL 2-0.9 UNIT/ML-% IJ SOLN
INTRAMUSCULAR | Status: AC
  Filled 2013-03-01: qty 1500

## 2013-03-01 MED ORDER — SODIUM CHLORIDE 0.9 % IV SOLN: 250.0000 mL | INTRAVENOUS | Status: DC | PRN

## 2013-03-01 MED ORDER — NITROGLYCERIN 0.2 MG/ML ON CALL CATH LAB
INTRAVENOUS | Status: AC
  Filled 2013-03-01: qty 1

## 2013-03-01 MED ORDER — ACETAMINOPHEN 325 MG PO TABS: 650.0000 mg | ORAL_TABLET | ORAL | Status: DC | PRN

## 2013-03-01 MED ORDER — OXYCODONE-ACETAMINOPHEN 5-325 MG PO TABS
ORAL_TABLET | ORAL | Status: AC
  Filled 2013-03-01: qty 2

## 2013-03-01 MED ORDER — ASPIRIN 81 MG PO CHEW
81.0000 mg | CHEWABLE_TABLET | ORAL | Status: AC
  Administered 2013-03-01: 81 mg via ORAL
  Filled 2013-03-01: qty 1

## 2013-03-01 MED ORDER — VERAPAMIL HCL 2.5 MG/ML IV SOLN
INTRAVENOUS | Status: AC
  Filled 2013-03-01: qty 2

## 2013-03-01 MED ORDER — SODIUM CHLORIDE 0.9 % IJ SOLN: 3.0000 mL | INTRAMUSCULAR | Status: DC | PRN

## 2013-03-01 MED ORDER — LIDOCAINE HCL (PF) 1 % IJ SOLN
INTRAMUSCULAR | Status: AC
  Filled 2013-03-01: qty 30

## 2013-03-01 MED ORDER — SODIUM CHLORIDE 0.9 % IV SOLN
INTRAVENOUS | Status: DC
  Administered 2013-03-01: 08:00:00 via INTRAVENOUS

## 2013-03-01 MED ORDER — ONDANSETRON HCL 4 MG/2ML IJ SOLN: 4.0000 mg | Freq: Four times a day (QID) | INTRAMUSCULAR | Status: DC | PRN

## 2013-03-01 NOTE — H&P (View-Only) (Signed)
Patient ID: Troy [name], male   DOB: 12/09/1940, 71 y.o.   MRN: [id number] Referred by Bonnie C Harrington NP in [place] for chest pains His history commenced in 1989. He presented with unstable angina, from his description, and subsequently had an angioplasty in [place], [place]. Details are unknown. He has had several catheterizations since then, and his last catheterization was by Dr. Gaddy in [facility], September 14, 1995. His catheterization then showed a 70% stenosis, but no intervention was necessary Seen for preiop clearence in 2008  Cath 12/06/2006 with no critical CAD  The left main coronary artery was normal. The left anterior descending  artery was somewhat heavily calcified. There was 20-30% tubular disease  proximally. Mid vessel had 20% multiple discrete lesions. First and  second diagonal branches were normal. There were no critical stenoses  in the LAD territory. The circumflex coronary artery was nondominant.  Proximal and mid vessel were normal. First obtuse marginal branch was  normal. There was a 30-40% lesion in the AV groove branch after the  takeoff of the first obtuse marginal branch.  The right coronary artery was heavily calcified throughout its course  but there were no discrete significant stenoses. There are 20-30%  multiple discrete lesions in the proximal mid and distal vessel. PDA  and PLA were normal.  RAO ventriculography: Ventriculography was normal. EF was 60%. There  is no gradient across the aortic valve and no MR.  Referred by [facility] center for increasing SSCP worrisome for unstable angina. Getting reproducible SSCP with exertion Taken 3-4 nitro past month when pain has not Gone away with relief No rest pain Has had vagotomy for ulcers and multiple stomach wraps for GERD but no GI overtones to his pains.  Frequency of pains have increased the last 6 weeks  ROS: Denies fever, malais, weight loss, blurry vision, decreased visual acuity, cough,  sputum, SOB, hemoptysis, pleuritic pain, palpitaitons, heartburn, abdominal pain, melena, lower extremity edema, claudication, or rash.  All other systems reviewed and negative   General: Affect appropriate Healthy:  appears stated age HEENT: normal Neck supple with no adenopathy JVP normal no bruits no thyromegaly Lungs clear with no wheezing and good diaphragmatic motion Heart:  S1/S2 no murmur,rub, gallop or click PMI normal Abdomen: benighn, BS positve, multiple scars form renal cyst removal, GB and vagotomy with esophageal wraps  no bruit.  No HSM or HJR Distal pulses intact with no bruits No edema Neuro non-focal Skin warm and dry No muscular weakness  Medications Current Outpatient Prescriptions  Medication Sig Dispense Refill  . HYDROcodone-acetaminophen (NORCO) 10-325 MG per tablet prn      . montelukast (SINGULAIR) 10 MG tablet Take 1 tablet by mouth daily.      . traMADol (ULTRAM) 50 MG tablet prn      . zolpidem (AMBIEN) 10 MG tablet Take 1 tablet by mouth daily.       No current facility-administered medications for this visit.    Allergies Augmentin and Flagyl  Family History: Family History  Problem Relation Age of Onset  . Cancer Mother     stomach    Social History: History   Social History  . Marital Status: Widowed    Spouse Name: N/A    Number of Children: N/A  . Years of Education: N/A   Occupational History  . Not on file.   Social History Main Topics  . Smoking status: Never Smoker   . Smokeless tobacco: Not on file  . Alcohol   Use: No  . Drug Use: No  . Sexual Activity: Not on file   Other Topics Concern  . Not on file   Social History Narrative  . No narrative on file    Electrocardiogram:  NSR normal ECG rate 80  Assessment and Plan    

## 2013-03-01 NOTE — CV Procedure (Signed)
.    Cardiac Catheterization Procedure Note  Name: Troy [name] MRN: [id number] DOB: 1940/07/18  Procedure: Left Heart Cath, Selective Coronary Angiography, LV angiography  Indication:  Chest Pain   Procedural details: The right groin was prepped, draped, and anesthetized with 1% lidocaine. Using modified Seldinger technique, a 5 French sheath was introduced into the right femoral artery. Standard JR 4 used to engage RCA  LM was high in left cusp and aorta elongated.  Difficult to engage  Diagnostic images done with MPA 2  Catheter exchanges were performed over a guidewire. There were no immediate procedural complications. The patient was transferred to the post catheterization recovery area for further monitoring.  Procedural Findings: Hemodynamics:  AO  137 69 LV  151 7 post EDP 17   Coronary angiography: Coronary dominance: right  Left mainstem:  Short segment normal  Left anterior descending (LAD):  Heavily calcified.  50% diffuse disease proximally  80% ostial first septal perforator Large vessel wraps apex  D1: normal  D2: small and normal  D3: normal  Left circumflex (LCx):  Normal proximally AV groove ostium 60-70%  OM1: small and normal  OM2: large with 30% ostial stenosis at AV groove take off  Right coronary artery (RCA): Heavily calcified 100% occluded in proximal portion.  Good collaterals from left  Left ventriculography: Left ventricular systolic function is normal, LVEF is estimated at 55-65%, there is no significant mitral regurgitation      inferobasal akinesis   Final Conclusions:   Essentially single vessel disease RCA occluded with good collaterals  Will have Dr [place] review but given question  Of lung cancer feel medical Rx indicated with further w/u by pulmonary for CA  Needs f/u PET/CT  Recommendations: Medical Rx Can consider CTO of RCA if he has lots of chest pain  [name] 03/01/2013, 11:04 AM

## 2013-03-01 NOTE — Interval H&P Note (Signed)
History and Physical Interval Note:  03/01/2013 10:04 AM  Troy [name]  has presented today for surgery, with the diagnosis of Chest pain  The various methods of treatment have been discussed with the patient and family. After consideration of risks, benefits and other options for treatment, the patient has consented to  Procedure(s): LEFT HEART CATHETERIZATION WITH CORONARY ANGIOGRAM (N/A) as a surgical intervention .  The patient's history has been reviewed, patient examined, no change in status, stable for surgery.  I have reviewed the patient's chart and labs.  Questions were answered to the patient's satisfaction.     [name]

## 2013-03-01 NOTE — Interval H&P Note (Signed)
History and Physical Interval Note:  03/01/2013 9:18 AM  Troy [name]  has presented today for surgery, with the diagnosis of Chest pain  The various methods of treatment have been discussed with the patient and family. After consideration of risks, benefits and other options for treatment, the patient has consented to  Procedure(s): LEFT HEART CATHETERIZATION WITH CORONARY ANGIOGRAM (N/A) as a surgical intervention .  The patient's history has been reviewed, patient examined, no change in status, stable for surgery.  I have reviewed the patient's chart and labs.  Questions were answered to the patient's satisfaction.     [name]

## 2013-03-03 ENCOUNTER — Ambulatory Visit (INDEPENDENT_AMBULATORY_CARE_PROVIDER_SITE_OTHER): Payer: Medicare Other | Admitting: Internal Medicine

## 2013-03-03 ENCOUNTER — Encounter: Payer: Self-pay | Admitting: Internal Medicine

## 2013-03-03 VITALS — BP 150/86 | HR 62 | Temp 98.1°F | Ht 72.0 in | Wt 172.0 lb

## 2013-03-03 DIAGNOSIS — J449 Chronic obstructive pulmonary disease, unspecified: Secondary | ICD-10-CM

## 2013-03-03 DIAGNOSIS — I10 Essential (primary) hypertension: Secondary | ICD-10-CM

## 2013-03-03 DIAGNOSIS — J432 Centrilobular emphysema: Secondary | ICD-10-CM | POA: Insufficient documentation

## 2013-03-03 DIAGNOSIS — J438 Other emphysema: Secondary | ICD-10-CM

## 2013-03-03 DIAGNOSIS — R911 Solitary pulmonary nodule: Secondary | ICD-10-CM

## 2013-03-03 DIAGNOSIS — R079 Chest pain, unspecified: Secondary | ICD-10-CM

## 2013-03-03 NOTE — Assessment & Plan Note (Addendum)
-   centrilobular pattern on ct chest 02/28/13  - spirometry 03/03/2013 > wnl   Not symptomatic and certainly not a contraindication to cabg or RULobectomy if needed   His sob is always assoc with cp that is c/w angina, not dynamic hyperinflation or any other pulmonary source.

## 2013-03-03 NOTE — Assessment & Plan Note (Addendum)
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   To do the wedge first before addressing the cardiac issue would be a cardiac risk that would need to be addressed separately by cards.   Discussed in detail all the  indications, usual  risks and alternatives  relative to the benefits with patient who agrees to proceed with PET scan first

## 2013-03-03 NOTE — Patient Instructions (Addendum)
Please see patient coordinator before you leave today  to schedule PET scan   I will let Dr Eden Emms know that if the nodule is your only pulmonary problem it won't interfere with plans for your bypass Late add :  Increase coreg to 2 bid due to hbp and AP

## 2013-03-03 NOTE — Progress Notes (Signed)
   Subjective:    Patient ID: Troy [name], male    DOB: [birth year]  MRN: [id number]  HPI  59 yowm quit smoking in 2004 lives in [place] but referred by Roberto Scales to [facility] for sob and cp x 11/2012 with abn cxr > CT chest > referred to pulmonary 03/03/2013 for spn    03/03/2013 1st [facility] office visit/ Wert cc new onset sob with gen ant chest discomfort x 2 months, camb on fairly acutely but present daily since if walks more than sev hundred feet, esp if gets in a hurry, assoc with  min cough slt discolored in am's but no subjective wheeze.  Has not tried saba.  Cp better with nitro.  No assoc nausea or radiation, pain never worse with breathing or coughing.  Says he needs heart surgery right away "half my heart is blocked"  LHC note reviewed: isolated RCA occlusion, good LV  No obvious day to day or daytime variabilty or assoc   subjective wheeze overt sinus or hb symptoms. No unusual exp hx or h/o childhood pna/ asthma or knowledge of premature birth.  Sleeping ok without nocturnal  or early am exacerbation  of respiratory  c/o's or need for noct saba. Also denies any obvious fluctuation of symptoms with weather or environmental changes or other aggravating or alleviating factors except as outlined above   Current Medications, Allergies, Complete Past Medical History, Past Surgical History, Family History, and Social History were reviewed in [employer] record.        Review of Systems  Constitutional: Positive for appetite change and unexpected weight change. Negative for fever, chills and activity change.  HENT: Positive for congestion. Negative for dental problem, postnasal drip, rhinorrhea, sneezing, sore throat, trouble swallowing and voice change.   Eyes: Negative for visual disturbance.  Respiratory: Positive for cough and shortness of breath. Negative for choking.   Cardiovascular: Positive for chest pain. Negative for leg swelling.   Gastrointestinal: Negative for nausea, vomiting and abdominal pain.  Genitourinary: Negative for difficulty urinating.  Musculoskeletal: Positive for arthralgias.  Skin: Negative for rash.  Psychiatric/Behavioral: Negative for behavioral problems and confusion.       Objective:   Physical Exam   amb wm nad  Wt Readings from Last 3 Encounters:  03/03/13 172 lb (78.019 kg)  03/01/13 173 lb (78.472 kg)  03/01/13 173 lb (78.472 kg)       amb thin wm nad    HEENT: edentulous with dentures in place, nl turbinates, and orophanx. Nl external ear canals without cough reflex   NECK :  without JVD/Nodes/TM/ nl carotid upstrokes bilaterally   LUNGS: no acc muscle use, clear to A and P bilaterally without cough on insp or exp maneuvers   CV:  RRR  no s3 or murmur or increase in P2, no edema   ABD:  soft and nontender with nl excursion in the supine position. No bruits or organomegaly, bowel sounds nl  MS:  warm without deformities, calf tenderness, cyanosis or clubbing  SKIN: warm and dry without lesions    NEURO:  alert, approp, no deficits    CT chest 02/28/13 >  1. Right upper lobe pulmonary nodule is suspicious. Recommend  further evaluation with PET-CT and possible tissue sampling.  2. Emphysema  Spirometry 03/03/2013 wnl       Assessment & Plan:

## 2013-03-03 NOTE — Progress Notes (Signed)
Quick Note:  Pt is scheduled to see Dr. Sherene Sires this evening. Spoke with Dr. Sherene Sires regarding this appt. He is ok with seeing pt if pt is having symptoms. If not, we can see if he would like to change appt to next week to see RB. I spoke with pt. Pt states he does have SOB with little activity. He would like to keep appt today with Dr. Sherene Sires. He is aware to bring all meds with him. He verbalized understanding and voiced no further questions or concerns at this time. ______

## 2013-03-04 NOTE — Assessment & Plan Note (Signed)
rec increase coreg to 2 bid pending repeat cards eval

## 2013-03-04 NOTE — Assessment & Plan Note (Signed)
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

## 2013-03-06 NOTE — Progress Notes (Signed)
APPT MADE SEE SCHEDULE./CY

## 2013-03-14 ENCOUNTER — Encounter: Payer: Self-pay | Admitting: Internal Medicine

## 2013-03-14 ENCOUNTER — Telehealth: Payer: Self-pay | Admitting: *Deleted

## 2013-03-14 ENCOUNTER — Encounter (HOSPITAL_COMMUNITY)
Admission: RE | Admit: 2013-03-14 | Discharge: 2013-03-14 | Disposition: A | Discharge status: Home / Self Care | Payer: Medicare Other | Source: Ambulatory Visit | Attending: Internal Medicine | Admitting: Internal Medicine

## 2013-03-14 ENCOUNTER — Encounter (HOSPITAL_COMMUNITY): Payer: Self-pay

## 2013-03-14 DIAGNOSIS — R911 Solitary pulmonary nodule: Secondary | ICD-10-CM

## 2013-03-14 LAB — GLUCOSE, CAPILLARY: Glucose-Capillary: 108 mg/dL — ABNORMAL HIGH (ref 70–99)

## 2013-03-14 MED ORDER — FLUDEOXYGLUCOSE F - 18 (FDG) INJECTION
18.8000 | Freq: Once | INTRAVENOUS | Status: AC | PRN
  Administered 2013-03-14: 18.8 via INTRAVENOUS

## 2013-03-14 NOTE — Telephone Encounter (Signed)
Pt returned call.  Set appt w/ MW 03/20/13 @ 11:00 AM (double book ok per Verlon Au).  Pt verbalized understanding & states nothing further needed at this time.  Antionette Fairy

## 2013-03-14 NOTE — Telephone Encounter (Signed)
LMTCB

## 2013-03-14 NOTE — Telephone Encounter (Signed)
Troy [name] spoke with pt and appt scheduled. Nothing further needed

## 2013-03-14 NOTE — Telephone Encounter (Signed)
Appt was scheduled.

## 2013-03-14 NOTE — Telephone Encounter (Signed)
Message copied by Christen Butter on Tue Mar 14, 2013  5:15 PM ------      Message from: Nyoka Cowden      Created: Tue Mar 14, 2013  4:33 PM       Be sure has appt with me / nishan 1st of year, same day as he's driving from [place]             ------

## 2013-03-20 ENCOUNTER — Ambulatory Visit (INDEPENDENT_AMBULATORY_CARE_PROVIDER_SITE_OTHER): Payer: Medicare Other | Admitting: Internal Medicine

## 2013-03-20 ENCOUNTER — Encounter: Payer: Self-pay | Admitting: Cardiovascular Disease

## 2013-03-20 ENCOUNTER — Encounter: Payer: Self-pay | Admitting: Internal Medicine

## 2013-03-20 ENCOUNTER — Ambulatory Visit (INDEPENDENT_AMBULATORY_CARE_PROVIDER_SITE_OTHER): Payer: Medicare Other | Admitting: Cardiovascular Disease

## 2013-03-20 VITALS — BP 138/82 | HR 68 | Ht 72.0 in | Wt 167.0 lb

## 2013-03-20 VITALS — BP 142/84 | HR 62 | Temp 97.7°F | Ht 72.0 in | Wt 169.0 lb

## 2013-03-20 DIAGNOSIS — R911 Solitary pulmonary nodule: Secondary | ICD-10-CM

## 2013-03-20 DIAGNOSIS — J438 Other emphysema: Secondary | ICD-10-CM

## 2013-03-20 DIAGNOSIS — R079 Chest pain, unspecified: Secondary | ICD-10-CM

## 2013-03-20 DIAGNOSIS — J432 Centrilobular emphysema: Secondary | ICD-10-CM

## 2013-03-20 MED ORDER — CARVEDILOL 6.25 MG PO TABS: 6.2500 mg | ORAL_TABLET | Freq: Two times a day (BID) | ORAL | Status: DC

## 2013-03-20 MED ORDER — RANOLAZINE ER 500 MG PO TB12: 500.0000 mg | ORAL_TABLET | Freq: Two times a day (BID) | ORAL | Status: DC

## 2013-03-20 NOTE — Progress Notes (Signed)
Patient ID: Troy [name], male   DOB: September 09, 1940, 72 y.o.   MRN: [id number] 72 yo with CAD Recent cath with 100% RCA with good collaterals.  Moderate LAD and AV circ disease Reviewed films again today and patients report of chest pain with slightest activity does not make sense given anatomy.  EF is normal Says he gets pain with showering , making the bed or stress.  Dr Sherene Sires increased coreg to 6.35 bid.  Needs thoracic surgery For CA.  Compliant with meds.  Has been staying with brother in [place] since cath.        ROS: Denies fever, malais, weight loss, blurry vision, decreased visual acuity, cough, sputum, SOB, hemoptysis, pleuritic pain, palpitaitons, heartburn, abdominal pain, melena, lower extremity edema, claudication, or rash.  All other systems reviewed and negative  General: Affect appropriate Healthy:  appears stated age HEENT: normal Neck supple with no adenopathy JVP normal no bruits no thyromegaly Lungs clear with no wheezing and good diaphragmatic motion Heart:  S1/S2 no murmur, no rub, gallop or click PMI normal Abdomen: benighn, BS positve, no tenderness, no AAA no bruit.  No HSM or HJR Distal pulses intact with no bruits No edema Neuro non-focal Skin warm and dry No muscular weakness   Current Outpatient Prescriptions  Medication Sig Dispense Refill  . aspirin 325 MG tablet Take 325 mg by mouth daily as needed (for chest pain).      . carvedilol (COREG) 3.125 MG tablet 2 tab am and 2 tab pm      . diazepam (VALIUM) 10 MG tablet Take 10 mg by mouth 2 (two) times daily as needed for anxiety (for back spasms).      Marland Kitchen HYDROcodone-acetaminophen (NORCO) 10-325 MG per tablet Take 1 tablet by mouth every 6 (six) hours as needed (for pain). prn      . isosorbide mononitrate (IMDUR) 30 MG 24 hr tablet Take 15 mg by mouth daily.      . montelukast (SINGULAIR) 10 MG tablet Take 1 tablet by mouth daily.      . traMADol (ULTRAM) 50 MG tablet Take 50-100 mg by mouth every 6 (six)  hours as needed (for pain).       . vitamin B-12 (CYANOCOBALAMIN) 1000 MCG tablet Take 1,000 mcg by mouth daily.      . vitamin E 400 UNIT capsule Take 400 Units by mouth daily.      Marland Kitchen zolpidem (AMBIEN) 10 MG tablet Take 1 tablet by mouth at bedtime as needed for sleep.        No current facility-administered medications for this visit.    Allergies  Augmentin; Flagyl; and Tape  Electrocardiogram:  SR rate 80 normal   Assessment and Plan

## 2013-03-20 NOTE — Assessment & Plan Note (Signed)
Well controlled.  Continue current medications and low sodium Dash type diet.    

## 2013-03-20 NOTE — Assessment & Plan Note (Signed)
Seeing Dr Sherene Sires today Will need to pospone thoracotomy until CAD stabilized.  Would not want to wait more than 6 months

## 2013-03-20 NOTE — Assessment & Plan Note (Signed)
Cholesterol is at goal.  Continue current dose of statin and diet Rx.  No myalgias or side effects.  F/U  LFT's in 6 months. No results found for this basename: LDLCALC             

## 2013-03-20 NOTE — Assessment & Plan Note (Signed)
Reviewed cath and there are no flow limiting lesions on Left with collateralized right  Will have Dr Excell Seltzer review films as well.  Add ranexa and continue higher dose beta blocker Lexiscan myovue to document ischemia extent.  Should only be positive in inferior wall with collaterals.

## 2013-03-20 NOTE — Patient Instructions (Addendum)
Your physician recommends that you schedule a follow-up appointment in: NEXT AVAILABLE WITH  DR [facility]  Your physician has recommended you make the following change in your medication: START RANEXA  500 MG  1 TAB  TWICE DAILY  Your physician has requested that you have a lexiscan myoview. For further information please visit [email or web address] Please follow instruction sheet, as given.

## 2013-03-20 NOTE — Patient Instructions (Addendum)
Try prilosec 20mg   Take 30-60 min before first meal of the day and Pepcid 20 mg one bedtime until return for your cardiology test   GERD (REFLUX)  is an extremely common cause of respiratory symptoms, many times with no significant heartburn at all.    It can be treated with medication, but also with lifestyle changes including avoidance of late meals, excessive alcohol, smoking cessation, and avoid fatty foods, chocolate, peppermint, colas, red wine, and acidic juices such as orange juice.  NO MINT OR MENTHOL PRODUCTS SO NO COUGH DROPS  USE SUGARLESS CANDY INSTEAD (jolley ranchers or Stover's)  NO OIL BASED VITAMINS - use powdered substitutes.   Once Dr Eden Emms clears you for surgery he or I can refer you to a Thoracic surgeon

## 2013-03-20 NOTE — Progress Notes (Signed)
Subjective:    Patient ID: Troy [name], male    DOB: 03/22/1941  MRN: [id number]    Brief patient profile:  63 yowm quit smoking in 2004 lives in [place] but referred by Roberto Scales to [place] for sob and cp x 11/2012 with abn cxr > CT chest > referred to pulmonary 03/03/2013 for spn with nl pft's.   History of Present Illness  03/03/2013 1st  Pulmonary office visit/ Wert cc new onset sob with gen ant chest discomfort x 2 months, came on fairly acutely but present daily since if walks more than sev hundred feet, esp if gets in a hurry, assoc with  min cough slt discolored in am's but no subjective wheeze.  Has not tried saba.  Cp better with nitro.  No assoc nausea or radiation, pain never worse with breathing or coughing.  Says he needs heart surgery right away "half my heart is blocked"  LHC note reviewed: isolated RCA occlusion, good LV   03/20/2013 f/u ov/Wert re:  Chief Complaint  Patient presents with  . Follow-up    Pt states CP is unchanged since the last visit. Breathing is doing welll. No new co's today.  cp  making a bed, shower and shampoo, vacuuming , knees hold  him back more than breathing, ntg not helping, only resting helps relieve the cp which is upper sub sternal to slt left, not pleuritic at all.  No obvious day to day or daytime variabilty or assoc chronic cough or cp or chest tightness, subjective wheeze overt sinus or hb symptoms. No unusual exp hx or h/o childhood pna/ asthma or knowledge of premature birth.  Sleeping ok without nocturnal  or early am exacerbation  of respiratory  c/o's or need for noct saba. Also denies any obvious fluctuation of symptoms with weather or environmental changes or other aggravating or alleviating factors except as outlined above   Current Medications, Allergies, Complete Past Medical History, Past Surgical History, Family History, and Social History were reviewed in [employer] record.  ROS  The  following are not active complaints unless bolded sore throat, dysphagia, dental problems, itching, sneezing,  nasal congestion or excess/ purulent secretions, ear ache,   fever, chills, sweats, unintended wt loss, pleuritic or exertional cp, hemoptysis,  orthopnea pnd or leg swelling, presyncope, palpitations, heartburn, abdominal pain, anorexia, nausea, vomiting, diarrhea  or change in bowel or urinary habits, change in stools or urine, dysuria,hematuria,  rash, arthralgias, visual complaints, headache, numbness weakness or ataxia or problems with walking or coordination,  change in mood/affect or memory.                Objective:   Physical Exam   amb wm nad  03/20/2013     169  Wt Readings from Last 3 Encounters:  03/03/13 172 lb (78.019 kg)  03/01/13 173 lb (78.472 kg)  03/01/13 173 lb (78.472 kg)          HEENT: edentulous with dentures in place, nl turbinates, and orophanx. Nl external ear canals without cough reflex   NECK :  without JVD/Nodes/TM/ nl carotid upstrokes bilaterally   LUNGS: no acc muscle use, clear to A and P bilaterally without cough on insp or exp maneuvers   CV:  RRR  no s3 or murmur or increase in P2, no edema   ABD:  soft and nontender with nl excursion in the supine position. No bruits or organomegaly, bowel sounds nl  MS:  warm without deformities, calf  tenderness, cyanosis or clubbing  SKIN: warm and dry without lesions            Spirometry 03/03/2013 wnl       Assessment & Plan:

## 2013-03-22 NOTE — Assessment & Plan Note (Signed)
-   centrilobular pattern on ct chest 02/28/13  - spirometry 03/03/2013 > wnl   Not a contraindication to T surgery

## 2013-03-22 NOTE — Assessment & Plan Note (Signed)
-   PET  03/13/13 > c/w Stage I Lung ca > rec T surgery eval (ok'd by [facility])  Discussed in detail all the  indications, usual  risks and alternatives  relative to the benefits with patient who agrees to proceed with referral to T surgery .

## 2013-03-22 NOTE — Assessment & Plan Note (Signed)
Dr Eden Emms not convinced this is AP but clearly not pulmonary so only other likely sources = [facility] and GERD > try max gerd rx and complete cards eval as outlined by Eden Emms

## 2013-03-23 DIAGNOSIS — C3491 Malignant neoplasm of unspecified part of right bronchus or lung: Secondary | ICD-10-CM

## 2013-03-23 HISTORY — DX: Malignant neoplasm of unspecified part of right bronchus or lung: C34.91

## 2013-03-28 ENCOUNTER — Ambulatory Visit (HOSPITAL_COMMUNITY): Payer: Medicare Other | Attending: Cardiology | Admitting: Radiology

## 2013-03-28 ENCOUNTER — Encounter: Payer: Self-pay | Admitting: Cardiology

## 2013-03-28 ENCOUNTER — Ambulatory Visit: Payer: Medicare Other | Admitting: Thoracic Surgery (Cardiothoracic Vascular Surgery)

## 2013-03-28 VITALS — BP 150/89 | Ht 72.0 in | Wt 166.0 lb

## 2013-03-28 DIAGNOSIS — R002 Palpitations: Secondary | ICD-10-CM | POA: Insufficient documentation

## 2013-03-28 DIAGNOSIS — R0609 Other forms of dyspnea: Secondary | ICD-10-CM | POA: Insufficient documentation

## 2013-03-28 DIAGNOSIS — R079 Chest pain, unspecified: Secondary | ICD-10-CM

## 2013-03-28 DIAGNOSIS — R0989 Other specified symptoms and signs involving the circulatory and respiratory systems: Secondary | ICD-10-CM | POA: Insufficient documentation

## 2013-03-28 DIAGNOSIS — R0602 Shortness of breath: Secondary | ICD-10-CM

## 2013-03-28 DIAGNOSIS — I2510 Atherosclerotic heart disease of native coronary artery without angina pectoris: Secondary | ICD-10-CM | POA: Insufficient documentation

## 2013-03-28 DIAGNOSIS — F17200 Nicotine dependence, unspecified, uncomplicated: Secondary | ICD-10-CM | POA: Insufficient documentation

## 2013-03-28 DIAGNOSIS — I10 Essential (primary) hypertension: Secondary | ICD-10-CM | POA: Insufficient documentation

## 2013-03-28 DIAGNOSIS — J449 Chronic obstructive pulmonary disease, unspecified: Secondary | ICD-10-CM | POA: Insufficient documentation

## 2013-03-28 DIAGNOSIS — J4489 Other specified chronic obstructive pulmonary disease: Secondary | ICD-10-CM | POA: Insufficient documentation

## 2013-03-28 MED ORDER — TECHNETIUM TC 99M SESTAMIBI GENERIC - CARDIOLITE
10.0000 | Freq: Once | INTRAVENOUS | Status: AC | PRN
  Administered 2013-03-28: 10 via INTRAVENOUS

## 2013-03-28 MED ORDER — TECHNETIUM TC 99M SESTAMIBI GENERIC - CARDIOLITE
30.0000 | Freq: Once | INTRAVENOUS | Status: AC | PRN
  Administered 2013-03-28: 30 via INTRAVENOUS

## 2013-03-28 MED ORDER — REGADENOSON 0.4 MG/5ML IV SOLN
0.4000 mg | Freq: Once | INTRAVENOUS | Status: AC
  Administered 2013-03-28: 0.4 mg via INTRAVENOUS

## 2013-03-28 MED ORDER — AMINOPHYLLINE 25 MG/ML IV SOLN
75.0000 mg | Freq: Once | INTRAVENOUS | Status: AC
  Administered 2013-03-28: 75 mg via INTRAVENOUS

## 2013-03-28 NOTE — Progress Notes (Signed)
[facility] [address] [place], [place] [address] [phone]    Cardiology Nuclear Med Study  Troy [name] is a 73 y.o. male     MRN : [id number]     DOB: [birth year]  Procedure Date: 03/28/2013  Nuclear Med Background Indication for Stress Test:  Evaluation for Ischemia and 03-01-13 Cath assess LAD,and CFX moderated disease,and Pending Surgical Clearance for Thoracic surgery (has not seen surgeon yet) History:  Asthma, COPD and 2014 100% RCA mod LAD and AV Circ collaterals Cardiac Risk Factors: Family History - CAD, History of Smoking and Hypertension  Symptoms:  Chest Pain, DOE, Palpitations and SOB   Nuclear Pre-Procedure Caffeine/Decaff Intake:  None > 12 hrs NPO After: 6:30pm   Lungs:  clear O2 Sat: 97% on room air. IV 0.9% NS with Angio Cath:  24g  IV Site: L Hand, tolerated well IV Started by:  Irven Baltimore, RN  Chest Size (in):  40 Cup Size: n/a  Height: 6' (1.829 m)  Weight:  166 lb (75.297 kg)  BMI:  Body mass index is 22.51 kg/(m^2). Tech Comments:  Took Coreg this am. This patient was given Aminophylline 75 mg for symptoms. All were resolved.    Nuclear Med Study 1 or 2 day study: 1 day  Stress Test Type:  Carlton Adam  Reading MD: N/A  Order Authorizing Provider:  Jenkins Rouge, MD  Resting Radionuclide: Technetium 49m Sestamibi  Resting Radionuclide Dose: 11.0 mCi   Stress Radionuclide:  Technetium 33m Sestamibi  Stress Radionuclide Dose: 33.0 mCi           Stress Protocol Rest HR: 64 Stress HR: 87  Rest BP: 150/89 Stress BP: 138/73  Exercise Time (min): n/a METS: n/a   Predicted Max HR: 149 bpm % Max HR: 58.39 bpm Rate Pressure Product: 13050   Dose of Adenosine (mg):  n/a Dose of Lexiscan: 0.4 mg  Dose of Atropine (mg): n/a Dose of Dobutamine: n/a mcg/kg/min (at max HR)  Stress Test Technologist: Perrin Maltese, EMT-P  Nuclear Technologist:  Charlton Amor, CNMT     Rest Procedure:  Myocardial perfusion imaging was  performed at rest 45 minutes following the intravenous administration of Technetium 68m Sestamibi. Rest ECG: Normal EKG  Stress Procedure:  The patient received IV Lexiscan 0.4 mg over 15-seconds.  Technetium 50m Sestamibi injected at 30-seconds. This patient had sob and chest pressure with the Lexiscan injection. Quantitative spect images were obtained after a 45 minute delay. Stress ECG: No significant change from baseline ECG  QPS Raw Data Images:  Normal; no motion artifact; normal heart/lung ratio. Stress Images:  There is a medium-sized area of moderate decreased uptake affecting the base/mid/apical inferior wall, base inferoseptal wall, and mid inferolateral wall. Rest Images:  Small area with mild decreased activity at the base of the inferior wall. Subtraction (SDS):  There is ischemia in the inferior wall. Transient Ischemic Dilatation (Normal <1.22):  0.93 Lung/Heart Ratio (Normal <0.45):  0.20  Quantitative Gated Spect Images QGS EDV:  93 ml QGS ESV:  44 ml  Impression Exercise Capacity:  Lexiscan with no exercise. BP Response:  Normal blood pressure response. Clinical Symptoms:  Shortness of breath. The patient also had some chest pressure ECG Impression:  No significant ST segment change suggestive of ischemia. Comparison with Prior Nuclear Study: I have compared the images with the report of the study from September, 2008  Overall Impression:  This is a medium risk scan. Wall motion remains good. There is  mild/moderate ischemia in the inferior wall. The report from 2008 noted an area of ischemia in the distal inferior wall.  LV Ejection Fraction: 53%.  LV Wall Motion:  Mild hypokinesis of the inferior wall.  Dola Argyle, MD

## 2013-04-05 ENCOUNTER — Encounter: Payer: Self-pay | Admitting: Thoracic Surgery (Cardiothoracic Vascular Surgery)

## 2013-04-05 ENCOUNTER — Other Ambulatory Visit: Payer: Self-pay | Admitting: *Deleted

## 2013-04-05 ENCOUNTER — Ambulatory Visit (INDEPENDENT_AMBULATORY_CARE_PROVIDER_SITE_OTHER): Payer: Medicare Other | Admitting: Thoracic Surgery (Cardiothoracic Vascular Surgery)

## 2013-04-05 VITALS — BP 151/94 | HR 79 | Resp 16 | Ht 72.0 in | Wt 167.0 lb

## 2013-04-05 DIAGNOSIS — I2510 Atherosclerotic heart disease of native coronary artery without angina pectoris: Secondary | ICD-10-CM

## 2013-04-05 DIAGNOSIS — I209 Angina pectoris, unspecified: Secondary | ICD-10-CM

## 2013-04-05 DIAGNOSIS — J438 Other emphysema: Secondary | ICD-10-CM

## 2013-04-05 DIAGNOSIS — I208 Other forms of angina pectoris: Secondary | ICD-10-CM

## 2013-04-05 DIAGNOSIS — R918 Other nonspecific abnormal finding of lung field: Secondary | ICD-10-CM

## 2013-04-05 DIAGNOSIS — J439 Emphysema, unspecified: Secondary | ICD-10-CM

## 2013-04-05 DIAGNOSIS — R222 Localized swelling, mass and lump, trunk: Secondary | ICD-10-CM

## 2013-04-05 NOTE — Progress Notes (Signed)
PCP is Pcp Not In System Referring Provider is Tanda Rockers, MD  Chief Complaint  Patient presents with  . Lung Lesion    eval and treat..CT CHEST/PET...SPIROMETY ONLY    HPI: 73 yo WM with a history of tobacco abuse and CAD presents with a cc/o chest pain with exertion  Troy [name] is a 73 year old gentleman with a long history of coronary disease including angioplasty in the 1990s. He also has a history of tobacco abuse with one pack a day from age [age] (45 pack years), he quit in 2004. He was in his usual state of health until last fall when he began having chest pain with exertion. He first noted this when he was trying to clean his gutters and rake leaves. This has now progressed to the point where he gets chest pain and associated shortness of breath with even minimal activity such as showering or walking around his house. He saw Dr. Johnsie Cancel and as part of his workup had a chest x-ray which revealed a right upper lobe mass.  A CT of the chest was done which showed a 2.1 cm spiculated mass in the right upper lobe. There was some central lobular emphysema primarily in the upper lobes. There was no mediastinal or hilar adenopathy. There also were extensive coronary calcifications.  He ultimately had a PET/CT which showed the lesion to be hypermetabolic. There was no evidence of regional or distant metastases.  As part of his workup for chest pain he had cardiac catheterization which revealed a totally occluded right coronary. The distal vessel was well collateralized and there was no significant disease in the LAD or circumflex. However he failed medical therapy with continued angina which has now progressed to the point of occurring with even minimal activity. He has not had any rest or nocturnal angina.  Even before his chest pain became the limiting factor his activities were somewhat limited by arthritis in his knees and chronic back pain. He is followed in a pain clinic for back pain. He  takes hydrocodone and Ultram for that. He has lost about 20 pounds over the past 3 months. He also has had decreased energy for several months. He sleeps on 2 pillows. 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 He has a productive cough with clear phlegm. He has not had any hemoptysis. He does not have a wheezing.  ECOG/ZUBROD= 1   Past Medical History  Diagnosis Date  . CAD (coronary artery disease)   . HTN (hypertension)   . COPD (chronic obstructive pulmonary disease)   . Hyperlipidemia   . Depression   . Vitamin D deficiency   . Sleep apnea     Past Surgical History  Procedure Laterality Date  . Cholecystectomy    . Hernia repair      x 11  . Angioplasty    . Cardiac catheterization      multiple  . Carpal tunnel release Right   . Spinal cord stimulator implant    . Back surgery    . Vagotomy    . Appendectomy    . Gastrectomy    . Nissen fundoplication      Family History  Problem Relation Age of Onset  . Lung cancer Mother     worked at a [employer]  . Heart disease Mother   . Stomach cancer Maternal Grandmother   . Brain cancer Father     Social History History  Substance Use Topics  .  Smoking status: Former Smoker -- 1.00 packs/day for 25 years    Types: Cigarettes    Quit date: 03/23/2002  . Smokeless tobacco: Never Used  . Alcohol Use: No    Current Outpatient Prescriptions  Medication Sig Dispense Refill  . aspirin 325 MG tablet Take 325 mg by mouth daily as needed (for chest pain).      . carvedilol (COREG) 6.25 MG tablet 2 (two) times daily with a meal.       . diazepam (VALIUM) 10 MG tablet Take 10 mg by mouth 2 (two) times daily as needed for anxiety (for back spasms).      Marland Kitchen HYDROcodone-acetaminophen (NORCO) 10-325 MG per tablet Take 1 tablet by mouth every 6 (six) hours as needed (for pain). prn      . isosorbide mononitrate (IMDUR) 30 MG 24 hr tablet Take 15 mg by mouth daily.      . montelukast (SINGULAIR) 10 MG  tablet Take 1 tablet by mouth daily.      . ranolazine (RANEXA) 500 MG 12 hr tablet Take 1 tablet (500 mg total) by mouth 2 (two) times daily.  60 tablet  6  . traMADol (ULTRAM) 50 MG tablet Take 50-100 mg by mouth every 6 (six) hours as needed (for pain).       . vitamin B-12 (CYANOCOBALAMIN) 1000 MCG tablet Take 1,000 mcg by mouth daily.      . vitamin E 400 UNIT capsule Take 400 Units by mouth daily.      Marland Kitchen zolpidem (AMBIEN) 10 MG tablet Take 1 tablet by mouth at bedtime as needed for sleep.        No current facility-administered medications for this visit.    Allergies  Allergen Reactions  . Augmentin [Amoxicillin-Pot Clavulanate] Other (See Comments)    "passed out"  . Flagyl [Metronidazole] Other (See Comments)    "passed out"  . Tape Other (See Comments)    blisters    Review of Systems  Constitutional: Positive for unexpected weight change (20 pounds in 3 months).       Chronic pain due to back - followed in pain clinic  HENT: Positive for dental problem (dentures).   Respiratory: Positive for cough (no hemoptysis) and shortness of breath (with chest pain). Negative for wheezing.   Cardiovascular: Positive for chest pain (with minimal exertion).       2 pillow orthopnea  Gastrointestinal:       Subtotal gastrectomy and 3 fundoplications, last via left chest  Genitourinary:       Polycystic kidney disease  Musculoskeletal: Positive for arthralgias (knees) and back pain.       Leg cramps  Hematological: Bruises/bleeds easily.  All other systems reviewed and are negative.    BP 151/94  Pulse 79  Resp 16  Ht 6' (1.829 m)  Wt 167 lb (75.751 kg)  BMI 22.64 kg/m2  SpO2 96% Physical Exam  Vitals reviewed. Constitutional: He is oriented to person, place, and time. No distress.  thin  HENT:  Head: Normocephalic and atraumatic.  Eyes: EOM are normal. Pupils are equal, round, and reactive to light.  Neck: Neck supple. No thyromegaly present.  No carotid bruits   Cardiovascular: Normal rate, regular rhythm, normal heart sounds and intact distal pulses.   Varicose veins bilaterally  Pulmonary/Chest: Effort normal and breath sounds normal. He has no wheezes. He has no rales.  Abdominal: Soft. There is no tenderness.  Mesh palpable under skin upper midline  Musculoskeletal: He exhibits  edema (trace). He exhibits no tenderness.  Lymphadenopathy:    He has no cervical adenopathy.  Neurological: He is alert and oriented to person, place, and time. No cranial nerve deficit.  Skin: Skin is warm and dry.     Diagnostic Tests: CARDIAC CATHETERIZATION Hemodynamics:  AO 137 69  LV 151 7 post EDP 17  Coronary angiography:  Coronary dominance: right  Left mainstem: Short segment normal  Left anterior descending (LAD): Heavily calcified. 50% diffuse disease proximally 80% ostial first septal perforator Large vessel wraps apex  D1: normal  D2: small and normal  D3: normal  Left circumflex (LCx): Normal proximally AV groove ostium 60-70%  OM1: small and normal  OM2: large with 30% ostial stenosis at AV groove take off  Right coronary artery (RCA): Heavily calcified 100% occluded in proximal portion. Good collaterals from left  Left ventriculography: Left ventricular systolic function is normal, LVEF is estimated at 55-65%, there is no significant mitral regurgitation  inferobasal akinesis  Final Conclusions: Essentially single vessel disease RCA occluded with good collaterals Will have Dr Martinique review but given question  Of lung cancer feel medical Rx indicated with further w/u by pulmonary for CA Needs f/u PET/CT  Recommendations: Medical Rx Can consider CTO of RCA if he has lots of chest pain  CT CHEST CT CHEST WITHOUT CONTRAST  TECHNIQUE:  Multidetector CT imaging of the chest was performed following the  standard protocol without IV contrast.  COMPARISON: Chest radiograph from 02/24/2013  FINDINGS:  No pleural effusion identified. Moderate to  advanced changes of  centrilobular and paraseptal emphysema identified. Pulmonary nodule  within the right upper lobe measures 2.1 cm, image 28/ series 3.  This corresponds with the chest radiograph abnormality.  There is a normal heart size. Calcifications involving the thoracic  aorta noted. There also calcifications involving the RCA, LAD and  left circumflex coronary arteries.  No mediastinal or hilar adenopathy identified. There is no axillary  or supraclavicular adenopathy.  Incidental imaging through the upper abdomen shows changes from  prior coli cystectomy. There is increase caliber of the common bile  duct which measures up to 1.2 cm. There are multiple cysts within  both kidneys which are of varying complexity. Cyst arising from the  upper pole of the right kidney contains an internal area of  septation.  IMPRESSION:  1. Right upper lobe pulmonary nodule is suspicious. Recommend  further evaluation with PET-CT and possible tissue sampling.  2. Emphysema  3. Calcified atherosclerotic disease including multi vessel coronary  artery calcifications.  4. Increase caliber of the common bile duct status post  cholecystectomy.  5. Bilateral renal cysts of varying complexity. Consider further  evaluation with pre and post-contrast cross-sectional imaging  through the kidneys. And MRI would be the study of choice.  Electronically Signed  By: Kerby Moors M.D.  On: 02/28/2013 17:17   PET/ CT NUCLEAR MEDICINE PET SKULL BASE TO THIGH  FASTING BLOOD GLUCOSE: Value: 108mg /dl  TECHNIQUE:  18.8 mCi F-18 FDG was injected intravenously. CT data was obtained  and used for attenuation correction and anatomic localization only.  (This was not acquired as a diagnostic CT examination.) Additional  exam technical data entered on technologist worksheet.  COMPARISON: 02/28/2013  FINDINGS:  NECK  No hypermetabolic lymph nodes in the neck.  CHEST  There are moderate changes of paraseptal  and centrilobular emphysema  identified. In the right upper lobe there is a pulmonary nodule  which measures 2.1 cm, image 98/series 2. The  SUV max associated  with this nodule is equal to 6.0. Adjacent subpleural nodule  measures 6 mm, image 102/series 2. This is too small to reliably  characterize.  The heart size appears normal. There is no pericardial effusion.  Calcified atherosclerotic disease affects the thoracic aorta as well  as the RCA, LAD and left circumflex coronary arteries. There is no  mediastinal or hilar adenopathy identified. No hypermetabolic  axillary or supraclavicular lymph nodes noted.  ABDOMEN/PELVIS  No abnormal hypermetabolic activity within the liver, pancreas,  adrenal glands, or spleen. No hypermetabolic lymph nodes in the  abdomen or pelvis. The patient is status post cholecystectomy. There  is mild increase caliber of the common bile duct.  There are multiple bilateral renal cysts of varying densities. These  are incompletely characterized without IV contrast material.  Calcified atherosclerotic disease affects the abdominal aorta. There  is no aneurysm. No enlarged or hypermetabolic lymph nodes identified  within the upper abdomen or the pelvis.  SKELETON  The bones appear osteopenic. There is multi level spondylosis  involving the thoracic and lumbar spine. Previous lumbar laminectomy  and posterior fusion surgery noted.  IMPRESSION:  1. 2.1 cm hypermetabolic nodule within the right upper lobe is  worrisome for primary lung neoplasm. Correlation with tissue  sampling advised.  2. No evidence for hypermetabolic adenopathy or distant metastatic  disease.  3. Emphysema.  4. Atherosclerotic disease including multi vessel coronary artery  calcifications.  5. Multiple, bilateral renal cysts. These are of varying densities  and incompletely characterized without IV contrast material.  Electronically Signed  By: Kerby Moors M.D.  On: 03/14/2013  12:32    PFTs FVC= 3.82(79%) FEV1 = 2.97(81%) FEV1/FVC= 76% MVV 136  Impression: 73 year old gentleman with severe single-vessel coronary disease and a newly discovered 2.1 cm right upper lobe mass. The right upper lobe mass is highly suspicious for, and almost certainly represents, a new primary bronchogenic carcinoma. The question is how to best deal with both of these problems and wants sequence to do so.  He only has single-vessel disease and has good collaterals to the distal branches of the right coronary. However he has failed maximal medical therapy for that and needs revascularization. Percutaneous intervention is not possible, therefore coronary bypass grafting is the best option. His heart needs to be addressed either prior to or at the same time as his lung mass. He would be a high risk for perioperative MI we were to try to do the lung resection first.  The lung mass is resectable. The only complicating factor with the lung nodule is that it sits right on the minor fissure. It peripheral enough lesion that I think we can resect it with a upper lobectomy although we may have to take a portion of the middle lobe. There's an outside chance he would require a bilobectomy (upper and middle). His lung function is more than adequate to tolerate the loss of parenchyma with either of those resections.  I discussed the options with Troy [name] and his brother. We discussed proceeding with coronary bypass grafting first and then coming back at a later time to do the lung resection versus a combined procedure. Both of those approaches have advantages and disadvantages. He very strongly wants to have both done at the same time if possible. I informed him that we could plan to do so, but if there are any issues encountered at surgery, we may have to defer the lung resection to a later date.  I discussed  the proposed operation which is coronary artery bypass grafting using a right mammary artery to the  right coronary artery and right upper lobectomy (possible bilobectomy) with Troy [name] and his brother. We discussed the possibility of off-pump grafting if technically possible. I think it's a 50-50 chance that OPCAB with the RIMA would be feasible in his case. We will plan to do the lung resection as well as technical issues determined that we should return at a later date. We discussed the general nature of the procedure, the need for general anesthesia, the expected hospital stay, and overall recovery.  We discussed the indications, risks, benefits, and alternatives. He understands that the risks of surgery include but are not limited to death, stroke, MI, DVT, PE, bleeding, possible need for transfusion, cardiac arrhythmias, infections, air leaks, other organ system dysfunction including respiratory or renal failure or gastrointestinal complications. He accepts these risks and wishes to proceed.  He wishes to wait at least 2 weeks to have the surgery done. I advised him not to put this off too long, but for now we'll plan to proceed on Thursday, January 29.  Plan: Coronary artery bypass grafting x1, possible OPCAB, possible right upper lobectomy on Thursday, January 29.

## 2013-04-06 ENCOUNTER — Ambulatory Visit: Payer: Medicare Other | Admitting: Cardiovascular Disease

## 2013-04-11 ENCOUNTER — Encounter: Payer: Medicare Other | Admitting: Thoracic Surgery (Cardiothoracic Vascular Surgery)

## 2013-04-13 ENCOUNTER — Encounter (HOSPITAL_COMMUNITY): Payer: Self-pay | Admitting: Pharmacy Technician

## 2013-04-18 ENCOUNTER — Encounter (HOSPITAL_COMMUNITY)
Admission: RE | Admit: 2013-04-18 | Discharge: 2013-04-18 | Disposition: A | Discharge status: Home / Self Care | Payer: Medicare Other | Source: Ambulatory Visit | Attending: Thoracic Surgery (Cardiothoracic Vascular Surgery) | Admitting: Thoracic Surgery (Cardiothoracic Vascular Surgery)

## 2013-04-18 ENCOUNTER — Encounter (HOSPITAL_COMMUNITY): Payer: Self-pay

## 2013-04-18 ENCOUNTER — Ambulatory Visit (HOSPITAL_COMMUNITY)
Admission: RE | Admit: 2013-04-18 | Discharge: 2013-04-18 | Disposition: A | Discharge status: Home / Self Care | Payer: Medicare Other | Source: Ambulatory Visit | Attending: Thoracic Surgery (Cardiothoracic Vascular Surgery) | Admitting: Thoracic Surgery (Cardiothoracic Vascular Surgery)

## 2013-04-18 VITALS — Wt 169.0 lb

## 2013-04-18 VITALS — BP 132/82 | HR 66 | Temp 98.3°F | Resp 18 | Ht 72.0 in | Wt 169.5 lb

## 2013-04-18 DIAGNOSIS — Z01810 Encounter for preprocedural cardiovascular examination: Secondary | ICD-10-CM | POA: Insufficient documentation

## 2013-04-18 DIAGNOSIS — Z01811 Encounter for preprocedural respiratory examination: Secondary | ICD-10-CM | POA: Insufficient documentation

## 2013-04-18 DIAGNOSIS — I2510 Atherosclerotic heart disease of native coronary artery without angina pectoris: Secondary | ICD-10-CM

## 2013-04-18 DIAGNOSIS — Z01812 Encounter for preprocedural laboratory examination: Secondary | ICD-10-CM | POA: Insufficient documentation

## 2013-04-18 DIAGNOSIS — Z01818 Encounter for other preprocedural examination: Secondary | ICD-10-CM | POA: Insufficient documentation

## 2013-04-18 HISTORY — DX: Shortness of breath: R06.02

## 2013-04-18 HISTORY — DX: Unspecified osteoarthritis, unspecified site: M19.90

## 2013-04-18 HISTORY — DX: Polycystic kidney, unspecified: Q61.3

## 2013-04-18 HISTORY — DX: Angina pectoris, unspecified: I20.9

## 2013-04-18 LAB — APTT: aPTT: 33 seconds (ref 24–37)

## 2013-04-18 LAB — HEMOGLOBIN A1C
Hgb A1c MFr Bld: 6.2 % — ABNORMAL HIGH (ref <5.7)
MEAN PLASMA GLUCOSE: 131 mg/dL — AB (ref <117)

## 2013-04-18 LAB — CBC
HCT: 36.3 % — ABNORMAL LOW (ref 39.0–52.0)
Hemoglobin: 12.1 g/dL — ABNORMAL LOW (ref 13.0–17.0)
MCH: 28.2 pg (ref 26.0–34.0)
MCHC: 33.3 g/dL (ref 30.0–36.0)
MCV: 84.6 fL (ref 78.0–100.0)
PLATELETS: 289 10*3/uL (ref 150–400)
RBC: 4.29 MIL/uL (ref 4.22–5.81)
RDW: 16 % — ABNORMAL HIGH (ref 11.5–15.5)
WBC: 9 10*3/uL (ref 4.0–10.5)

## 2013-04-18 LAB — URINALYSIS, ROUTINE W REFLEX MICROSCOPIC
BILIRUBIN URINE: NEGATIVE
GLUCOSE, UA: NEGATIVE mg/dL
HGB URINE DIPSTICK: NEGATIVE
Ketones, ur: NEGATIVE mg/dL
Leukocytes, UA: NEGATIVE
Nitrite: NEGATIVE
PH: 6 (ref 5.0–8.0)
Protein, ur: NEGATIVE mg/dL
SPECIFIC GRAVITY, URINE: 1.016 (ref 1.005–1.030)
Urobilinogen, UA: 1 mg/dL (ref 0.0–1.0)

## 2013-04-18 LAB — TYPE AND SCREEN
ABO/RH(D): O POS
Antibody Screen: NEGATIVE

## 2013-04-18 LAB — BLOOD GAS, ARTERIAL
ACID-BASE EXCESS: 1 mmol/L (ref 0.0–2.0)
Bicarbonate: 24.9 mEq/L — ABNORMAL HIGH (ref 20.0–24.0)
Drawn by: 206361
FIO2: 0.21 %
O2 SAT: 98.4 %
PATIENT TEMPERATURE: 98.6
TCO2: 26 mmol/L (ref 0–100)
pCO2 arterial: 38.3 mmHg (ref 35.0–45.0)
pH, Arterial: 7.428 (ref 7.350–7.450)
pO2, Arterial: 104 mmHg — ABNORMAL HIGH (ref 80.0–100.0)

## 2013-04-18 LAB — COMPREHENSIVE METABOLIC PANEL
ALT: 12 U/L (ref 0–53)
AST: 17 U/L (ref 0–37)
Albumin: 3.7 g/dL (ref 3.5–5.2)
Alkaline Phosphatase: 90 U/L (ref 39–117)
BUN: 17 mg/dL (ref 6–23)
CALCIUM: 9.2 mg/dL (ref 8.4–10.5)
CO2: 21 mEq/L (ref 19–32)
Chloride: 100 mEq/L (ref 96–112)
Creatinine, Ser: 0.81 mg/dL (ref 0.50–1.35)
GFR, EST NON AFRICAN AMERICAN: 87 mL/min — AB (ref >90)
GLUCOSE: 109 mg/dL — AB (ref 70–99)
Potassium: 4.5 mEq/L (ref 3.7–5.3)
SODIUM: 137 meq/L (ref 137–147)
TOTAL PROTEIN: 7.4 g/dL (ref 6.0–8.3)
Total Bilirubin: 0.3 mg/dL (ref 0.3–1.2)

## 2013-04-18 LAB — PROTIME-INR
INR: 0.93 (ref 0.00–1.49)
PROTHROMBIN TIME: 12.3 s (ref 11.6–15.2)

## 2013-04-18 LAB — SURGICAL PCR SCREEN
MRSA, PCR: NEGATIVE
STAPHYLOCOCCUS AUREUS: NEGATIVE

## 2013-04-18 NOTE — Progress Notes (Signed)
PCP: Trevor Mace, [place] [phone]  Cardiologist: Dr.Nishan Pulmologist: Dr. Melvyn Novas  Sleep study done more than 20 yrs. Ago.  Called Ryan at Dr. Gillermina Phy office, stated pt. Had PFT done 03/03/13 .

## 2013-04-18 NOTE — Progress Notes (Signed)
VASCULAR LAB PRELIMINARY  PRELIMINARY  PRELIMINARY  PRELIMINARY  Pre-op Cardiac Surgery  Carotid Findings:  Bilateral:  1-39% ICA stenosis.  Vertebral artery flow is antegrade.      Upper Extremity Right Left  Brachial Pressures 126  Triphasic  129  Triphasic   Radial Waveforms Triphasic  Triphasic   Ulnar Waveforms Triphasic  Triphasic   Palmar Arch (Allen's Test) Within normal limits  Doppler normal with radial compression, obliterates with ulnar compression.      Troy [name], RVT 04/18/2013, 11:11 AM

## 2013-04-18 NOTE — Pre-Procedure Instructions (Signed)
Troy [name]  04/18/2013   Your procedure is scheduled on:  Thursday, Jan. 29  Report to [facility] Main Entrance "A" at 5:30 AM.  Call this number if you have problems the morning of surgery: [phone]   Remember:   Do not eat food or drink liquids after midnight.   Take these medicines the morning of surgery with A SIP OF WATER: bring inhaler, aspirin, carvedilol, hydrocodone, isosorbide, singular if needed, tramadol   Do not wear jewelry, make-up or nail polish.  Do not wear lotions, powders, or perfumes. You may wear deodorant.  Do not shave 48 hours prior to surgery. Men may shave face and neck.  Do not bring valuables to the hospital.  [facility] is not responsible      for any belongings or valuables.               Contacts, dentures or bridgework may not be worn into surgery.  Leave suitcase in the car. After surgery it may be brought to your room.  For patients admitted to the hospital, discharge time is determined by your                treatment team.               Patients discharged the day of surgery will not be allowed to drive  home.  Name and phone number of your driver:  Special Instructions: Shower using CHG 2 nights before surgery and the night before surgery.  If you shower the day of surgery use CHG.  Use special wash - you have one bottle of CHG for all showers.  You should use approximately 1/3 of the bottle for each shower.   Please read over the following fact sheets that you were given: Open Heart Packet, MRSA Information and Surgical Site Infection Prevention

## 2013-04-19 ENCOUNTER — Encounter (HOSPITAL_COMMUNITY): Payer: Self-pay | Admitting: Certified Registered Nurse Anesthetist

## 2013-04-19 MED ORDER — PAPAVERINE HCL 30 MG/ML IJ SOLN
INTRAMUSCULAR | Status: AC
  Filled 2013-04-19: qty 2.5

## 2013-04-19 MED ORDER — DEXMEDETOMIDINE HCL IN NACL 400 MCG/100ML IV SOLN
0.1000 ug/kg/h | INTRAVENOUS | Status: AC
  Filled 2013-04-19: qty 100

## 2013-04-19 MED ORDER — METOPROLOL TARTRATE 12.5 MG HALF TABLET: 12.5000 mg | ORAL_TABLET | Freq: Once | ORAL | Status: DC

## 2013-04-19 MED ORDER — POTASSIUM CHLORIDE 2 MEQ/ML IV SOLN
80.0000 meq | INTRAVENOUS | Status: AC
  Filled 2013-04-19: qty 40

## 2013-04-19 MED ORDER — EPINEPHRINE HCL 1 MG/ML IJ SOLN
0.5000 ug/min | INTRAVENOUS | Status: AC
  Filled 2013-04-19: qty 4

## 2013-04-19 MED ORDER — CHLORHEXIDINE GLUCONATE 4 % EX LIQD: 30.0000 mL | CUTANEOUS | Status: AC

## 2013-04-19 MED ORDER — SODIUM CHLORIDE 0.9 % IV SOLN
INTRAVENOUS | Status: AC
  Filled 2013-04-19: qty 30

## 2013-04-19 MED ORDER — CEFUROXIME SODIUM 750 MG IJ SOLR
750.0000 mg | INTRAMUSCULAR | Status: AC
  Filled 2013-04-19: qty 750

## 2013-04-19 MED ORDER — DOPAMINE-DEXTROSE 3.2-5 MG/ML-% IV SOLN
2.0000 ug/kg/min | INTRAVENOUS | Status: AC
  Filled 2013-04-19: qty 250

## 2013-04-19 MED ORDER — CEFUROXIME SODIUM 1.5 G IJ SOLR
1.5000 g | INTRAMUSCULAR | Status: AC
  Administered 2013-04-20: 1.5 g via INTRAVENOUS
  Administered 2013-04-20: .75 g via INTRAVENOUS
  Filled 2013-04-19 (×2): qty 1.5

## 2013-04-19 MED ORDER — NITROGLYCERIN IN D5W 200-5 MCG/ML-% IV SOLN
2.0000 ug/min | INTRAVENOUS | Status: AC
  Filled 2013-04-19: qty 250

## 2013-04-19 MED ORDER — PHENYLEPHRINE HCL 10 MG/ML IJ SOLN
30.0000 ug/min | INTRAMUSCULAR | Status: AC
  Filled 2013-04-19: qty 2

## 2013-04-19 MED ORDER — SODIUM CHLORIDE 0.9 % IV SOLN
INTRAVENOUS | Status: AC
  Filled 2013-04-19: qty 1

## 2013-04-19 MED ORDER — MAGNESIUM SULFATE 50 % IJ SOLN
40.0000 meq | INTRAMUSCULAR | Status: AC
  Filled 2013-04-19: qty 10

## 2013-04-19 MED ORDER — SODIUM CHLORIDE 0.9 % IV SOLN
INTRAVENOUS | Status: AC
  Filled 2013-04-19: qty 40

## 2013-04-19 MED ORDER — VANCOMYCIN HCL 10 G IV SOLR
1250.0000 mg | INTRAVENOUS | Status: AC
  Administered 2013-04-20: 1250 mg via INTRAVENOUS
  Filled 2013-04-19 (×3): qty 1250

## 2013-04-20 ENCOUNTER — Encounter (HOSPITAL_COMMUNITY): Payer: Self-pay | Admitting: *Deleted

## 2013-04-20 ENCOUNTER — Inpatient Hospital Stay (HOSPITAL_COMMUNITY): Payer: Medicare Other

## 2013-04-20 ENCOUNTER — Inpatient Hospital Stay (HOSPITAL_COMMUNITY): Payer: Medicare Other | Admitting: Certified Registered Nurse Anesthetist

## 2013-04-20 ENCOUNTER — Encounter (HOSPITAL_COMMUNITY): Payer: Medicare Other | Admitting: Certified Registered Nurse Anesthetist

## 2013-04-20 ENCOUNTER — Inpatient Hospital Stay (HOSPITAL_COMMUNITY)
Admission: RE | Admit: 2013-04-20 | Discharge: 2013-05-05 | DRG: 235 | Disposition: A | Discharge status: Home Health | Payer: Medicare Other | Source: Ambulatory Visit | Attending: Thoracic Surgery (Cardiothoracic Vascular Surgery) | Admitting: Thoracic Surgery (Cardiothoracic Vascular Surgery)

## 2013-04-20 ENCOUNTER — Encounter: Payer: Self-pay | Admitting: Internal Medicine

## 2013-04-20 ENCOUNTER — Encounter (HOSPITAL_COMMUNITY)
Admission: RE | Disposition: A | Discharge status: Home Health | Payer: Medicare Other | Source: Ambulatory Visit | Attending: Thoracic Surgery (Cardiothoracic Vascular Surgery)

## 2013-04-20 DIAGNOSIS — J9382 Other air leak: Secondary | ICD-10-CM | POA: Diagnosis not present

## 2013-04-20 DIAGNOSIS — R230 Cyanosis: Secondary | ICD-10-CM

## 2013-04-20 DIAGNOSIS — E43 Unspecified severe protein-calorie malnutrition: Secondary | ICD-10-CM | POA: Diagnosis present

## 2013-04-20 DIAGNOSIS — Z9861 Coronary angioplasty status: Secondary | ICD-10-CM

## 2013-04-20 DIAGNOSIS — I10 Essential (primary) hypertension: Secondary | ICD-10-CM

## 2013-04-20 DIAGNOSIS — I2510 Atherosclerotic heart disease of native coronary artery without angina pectoris: Secondary | ICD-10-CM

## 2013-04-20 DIAGNOSIS — E559 Vitamin D deficiency, unspecified: Secondary | ICD-10-CM | POA: Diagnosis present

## 2013-04-20 DIAGNOSIS — G8918 Other acute postprocedural pain: Secondary | ICD-10-CM | POA: Diagnosis not present

## 2013-04-20 DIAGNOSIS — R079 Chest pain, unspecified: Secondary | ICD-10-CM

## 2013-04-20 DIAGNOSIS — Z8249 Family history of ischemic heart disease and other diseases of the circulatory system: Secondary | ICD-10-CM

## 2013-04-20 DIAGNOSIS — F3289 Other specified depressive episodes: Secondary | ICD-10-CM | POA: Diagnosis present

## 2013-04-20 DIAGNOSIS — I2582 Chronic total occlusion of coronary artery: Secondary | ICD-10-CM | POA: Diagnosis present

## 2013-04-20 DIAGNOSIS — G4733 Obstructive sleep apnea (adult) (pediatric): Secondary | ICD-10-CM | POA: Diagnosis present

## 2013-04-20 DIAGNOSIS — E8779 Other fluid overload: Secondary | ICD-10-CM | POA: Diagnosis not present

## 2013-04-20 DIAGNOSIS — F329 Major depressive disorder, single episode, unspecified: Secondary | ICD-10-CM | POA: Diagnosis present

## 2013-04-20 DIAGNOSIS — Z888 Allergy status to other drugs, medicaments and biological substances status: Secondary | ICD-10-CM

## 2013-04-20 DIAGNOSIS — R000 Tachycardia, unspecified: Secondary | ICD-10-CM

## 2013-04-20 DIAGNOSIS — D381 Neoplasm of uncertain behavior of trachea, bronchus and lung: Secondary | ICD-10-CM

## 2013-04-20 DIAGNOSIS — Z800 Family history of malignant neoplasm of digestive organs: Secondary | ICD-10-CM

## 2013-04-20 DIAGNOSIS — Z801 Family history of malignant neoplasm of trachea, bronchus and lung: Secondary | ICD-10-CM

## 2013-04-20 DIAGNOSIS — IMO0002 Reserved for concepts with insufficient information to code with codable children: Secondary | ICD-10-CM

## 2013-04-20 DIAGNOSIS — Z951 Presence of aortocoronary bypass graft: Secondary | ICD-10-CM

## 2013-04-20 DIAGNOSIS — I959 Hypotension, unspecified: Secondary | ICD-10-CM | POA: Diagnosis not present

## 2013-04-20 DIAGNOSIS — Z881 Allergy status to other antibiotic agents status: Secondary | ICD-10-CM

## 2013-04-20 DIAGNOSIS — G8929 Other chronic pain: Secondary | ICD-10-CM | POA: Diagnosis present

## 2013-04-20 DIAGNOSIS — K5900 Constipation, unspecified: Secondary | ICD-10-CM | POA: Diagnosis not present

## 2013-04-20 DIAGNOSIS — R110 Nausea: Secondary | ICD-10-CM | POA: Diagnosis not present

## 2013-04-20 DIAGNOSIS — C3410 Malignant neoplasm of upper lobe, unspecified bronchus or lung: Secondary | ICD-10-CM | POA: Diagnosis present

## 2013-04-20 DIAGNOSIS — D62 Acute posthemorrhagic anemia: Secondary | ICD-10-CM | POA: Diagnosis not present

## 2013-04-20 DIAGNOSIS — R9431 Abnormal electrocardiogram [ECG] [EKG]: Secondary | ICD-10-CM | POA: Diagnosis not present

## 2013-04-20 DIAGNOSIS — I498 Other specified cardiac arrhythmias: Secondary | ICD-10-CM | POA: Diagnosis present

## 2013-04-20 DIAGNOSIS — Z87891 Personal history of nicotine dependence: Secondary | ICD-10-CM

## 2013-04-20 DIAGNOSIS — J939 Pneumothorax, unspecified: Secondary | ICD-10-CM

## 2013-04-20 DIAGNOSIS — R911 Solitary pulmonary nodule: Secondary | ICD-10-CM

## 2013-04-20 DIAGNOSIS — M1710 Unilateral primary osteoarthritis, unspecified knee: Secondary | ICD-10-CM | POA: Diagnosis present

## 2013-04-20 DIAGNOSIS — E785 Hyperlipidemia, unspecified: Secondary | ICD-10-CM

## 2013-04-20 DIAGNOSIS — N390 Urinary tract infection, site not specified: Secondary | ICD-10-CM | POA: Diagnosis not present

## 2013-04-20 DIAGNOSIS — Z7982 Long term (current) use of aspirin: Secondary | ICD-10-CM

## 2013-04-20 DIAGNOSIS — J438 Other emphysema: Secondary | ICD-10-CM | POA: Diagnosis present

## 2013-04-20 DIAGNOSIS — K560 Paralytic ileus: Secondary | ICD-10-CM | POA: Diagnosis not present

## 2013-04-20 DIAGNOSIS — M549 Dorsalgia, unspecified: Secondary | ICD-10-CM | POA: Diagnosis present

## 2013-04-20 DIAGNOSIS — J432 Centrilobular emphysema: Secondary | ICD-10-CM | POA: Diagnosis present

## 2013-04-20 DIAGNOSIS — Q613 Polycystic kidney, unspecified: Secondary | ICD-10-CM

## 2013-04-20 DIAGNOSIS — I4891 Unspecified atrial fibrillation: Secondary | ICD-10-CM | POA: Diagnosis not present

## 2013-04-20 HISTORY — PX: CORONARY ARTERY BYPASS GRAFT: SHX141

## 2013-04-20 HISTORY — PX: INTRAOPERATIVE TRANSESOPHAGEAL ECHOCARDIOGRAM: SHX5062

## 2013-04-20 HISTORY — PX: LOBECTOMY: SHX5089

## 2013-04-20 LAB — POCT I-STAT 3, ART BLOOD GAS (G3+)
ACID-BASE DEFICIT: 1 mmol/L (ref 0.0–2.0)
ACID-BASE EXCESS: 1 mmol/L (ref 0.0–2.0)
Acid-base deficit: 1 mmol/L (ref 0.0–2.0)
BICARBONATE: 26.7 meq/L — AB (ref 20.0–24.0)
Bicarbonate: 25.5 mEq/L — ABNORMAL HIGH (ref 20.0–24.0)
Bicarbonate: 21.1 mEq/L (ref 20.0–24.0)
Bicarbonate: 23.6 mEq/L (ref 20.0–24.0)
O2 SAT: 100 %
O2 SAT: 100 %
O2 SAT: 99 %
O2 Saturation: 100 %
PCO2 ART: 39.5 mmHg (ref 35.0–45.0)
PH ART: 7.329 — AB (ref 7.350–7.450)
PO2 ART: 202 mmHg — AB (ref 80.0–100.0)
Patient temperature: 37
Patient temperature: 37.1
TCO2: 28 mmol/L (ref 0–100)
TCO2: 27 mmol/L (ref 0–100)
TCO2: 22 mmol/L (ref 0–100)
TCO2: 25 mmol/L (ref 0–100)
pCO2 arterial: 50.7 mmHg — ABNORMAL HIGH (ref 35.0–45.0)
pCO2 arterial: 32 mmHg — ABNORMAL LOW (ref 35.0–45.0)
pCO2 arterial: 26.9 mmHg — ABNORMAL LOW (ref 35.0–45.0)
pH, Arterial: 7.495 — ABNORMAL HIGH (ref 7.350–7.450)
pH, Arterial: 7.503 — ABNORMAL HIGH (ref 7.350–7.450)
pH, Arterial: 7.384 (ref 7.350–7.450)
pO2, Arterial: 502 mmHg — ABNORMAL HIGH (ref 80.0–100.0)
pO2, Arterial: 187 mmHg — ABNORMAL HIGH (ref 80.0–100.0)
pO2, Arterial: 148 mmHg — ABNORMAL HIGH (ref 80.0–100.0)

## 2013-04-20 LAB — POCT I-STAT, CHEM 8
BUN: 12 mg/dL (ref 6–23)
Calcium, Ion: 1.21 mmol/L (ref 1.13–1.30)
Chloride: 105 mEq/L (ref 96–112)
Creatinine, Ser: 0.6 mg/dL (ref 0.50–1.35)
Glucose, Bld: 133 mg/dL — ABNORMAL HIGH (ref 70–99)
HCT: 33 % — ABNORMAL LOW (ref 39.0–52.0)
HEMOGLOBIN: 11.2 g/dL — AB (ref 13.0–17.0)
Potassium: 4.1 mEq/L (ref 3.7–5.3)
SODIUM: 136 meq/L — AB (ref 137–147)
TCO2: 22 mmol/L (ref 0–100)

## 2013-04-20 LAB — CREATININE, SERUM
CREATININE: 0.62 mg/dL (ref 0.50–1.35)
GFR calc Af Amer: >90 mL/min (ref >90)

## 2013-04-20 LAB — GLUCOSE, CAPILLARY
GLUCOSE-CAPILLARY: 92 mg/dL (ref 70–99)
GLUCOSE-CAPILLARY: 93 mg/dL (ref 70–99)
Glucose-Capillary: 113 mg/dL — ABNORMAL HIGH (ref 70–99)
Glucose-Capillary: 120 mg/dL — ABNORMAL HIGH (ref 70–99)

## 2013-04-20 LAB — CBC
HCT: 30.9 % — ABNORMAL LOW (ref 39.0–52.0)
HCT: 29.1 % — ABNORMAL LOW (ref 39.0–52.0)
HEMOGLOBIN: 9.9 g/dL — AB (ref 13.0–17.0)
Hemoglobin: 10.4 g/dL — ABNORMAL LOW (ref 13.0–17.0)
MCH: 28.3 pg (ref 26.0–34.0)
MCH: 28.4 pg (ref 26.0–34.0)
MCHC: 33.7 g/dL (ref 30.0–36.0)
MCHC: 34 g/dL (ref 30.0–36.0)
MCV: 84 fL (ref 78.0–100.0)
MCV: 83.6 fL (ref 78.0–100.0)
PLATELETS: 218 10*3/uL (ref 150–400)
Platelets: 179 10*3/uL (ref 150–400)
RBC: 3.68 MIL/uL — ABNORMAL LOW (ref 4.22–5.81)
RBC: 3.48 MIL/uL — ABNORMAL LOW (ref 4.22–5.81)
RDW: 15.8 % — AB (ref 11.5–15.5)
RDW: 15.8 % — AB (ref 11.5–15.5)
WBC: 12.2 10*3/uL — AB (ref 4.0–10.5)
WBC: 11.9 10*3/uL — ABNORMAL HIGH (ref 4.0–10.5)

## 2013-04-20 LAB — POCT I-STAT 4, (NA,K, GLUC, HGB,HCT)
GLUCOSE: 120 mg/dL — AB (ref 70–99)
GLUCOSE: 94 mg/dL (ref 70–99)
Glucose, Bld: 113 mg/dL — ABNORMAL HIGH (ref 70–99)
Glucose, Bld: 138 mg/dL — ABNORMAL HIGH (ref 70–99)
HCT: 30 % — ABNORMAL LOW (ref 39.0–52.0)
HCT: 33 % — ABNORMAL LOW (ref 39.0–52.0)
HEMATOCRIT: 34 % — AB (ref 39.0–52.0)
HEMATOCRIT: 31 % — AB (ref 39.0–52.0)
HEMOGLOBIN: 11.6 g/dL — AB (ref 13.0–17.0)
HEMOGLOBIN: 11.2 g/dL — AB (ref 13.0–17.0)
Hemoglobin: 10.2 g/dL — ABNORMAL LOW (ref 13.0–17.0)
Hemoglobin: 10.5 g/dL — ABNORMAL LOW (ref 13.0–17.0)
POTASSIUM: 4.2 meq/L (ref 3.7–5.3)
Potassium: 4.1 mEq/L (ref 3.7–5.3)
Potassium: 3.6 mEq/L — ABNORMAL LOW (ref 3.7–5.3)
Potassium: 3.5 mEq/L — ABNORMAL LOW (ref 3.7–5.3)
SODIUM: 140 meq/L (ref 137–147)
Sodium: 142 mEq/L (ref 137–147)
Sodium: 141 mEq/L (ref 137–147)
Sodium: 142 mEq/L (ref 137–147)

## 2013-04-20 LAB — PROTIME-INR
INR: 1.16 (ref 0.00–1.49)
Prothrombin Time: 14.6 seconds (ref 11.6–15.2)

## 2013-04-20 LAB — MAGNESIUM: Magnesium: 2.3 mg/dL (ref 1.5–2.5)

## 2013-04-20 LAB — APTT: APTT: 34 s (ref 24–37)

## 2013-04-20 SURGERY — OFF PUMP CORONARY ARTERY BYPASS GRAFTING (CABG)
Anesthesia: General | Site: Chest | Laterality: Right

## 2013-04-20 MED ORDER — FENTANYL CITRATE 0.05 MG/ML IJ SOLN
INTRAMUSCULAR | Status: DC | PRN
  Administered 2013-04-20: 150 ug via INTRAVENOUS
  Administered 2013-04-20: 50 ug via INTRAVENOUS
  Administered 2013-04-20: 400 ug via INTRAVENOUS
  Administered 2013-04-20: 50 ug via INTRAVENOUS
  Administered 2013-04-20: 150 ug via INTRAVENOUS
  Administered 2013-04-20: 50 ug via INTRAVENOUS
  Administered 2013-04-20: 100 ug via INTRAVENOUS
  Administered 2013-04-20 (×4): 50 ug via INTRAVENOUS
  Administered 2013-04-20: 150 ug via INTRAVENOUS
  Administered 2013-04-20 (×2): 50 ug via INTRAVENOUS
  Administered 2013-04-20: 150 ug via INTRAVENOUS
  Administered 2013-04-20 (×2): 100 ug via INTRAVENOUS

## 2013-04-20 MED ORDER — INSULIN ASPART 100 UNIT/ML ~~LOC~~ SOLN
0.0000 [IU] | SUBCUTANEOUS | Status: DC
  Administered 2013-04-20: 2 [IU] via SUBCUTANEOUS

## 2013-04-20 MED ORDER — METOPROLOL TARTRATE 12.5 MG HALF TABLET
12.5000 mg | ORAL_TABLET | Freq: Two times a day (BID) | ORAL | Status: DC
  Filled 2013-04-20 (×3): qty 1

## 2013-04-20 MED ORDER — MAGNESIUM SULFATE 40 MG/ML IJ SOLN
4.0000 g | Freq: Once | INTRAMUSCULAR | Status: AC
  Administered 2013-04-20: 4 g via INTRAVENOUS
  Filled 2013-04-20: qty 100

## 2013-04-20 MED ORDER — SODIUM CHLORIDE 0.9 % IR SOLN
Status: DC | PRN
  Administered 2013-04-20: 7000 mL

## 2013-04-20 MED ORDER — PROTAMINE SULFATE 10 MG/ML IV SOLN
INTRAVENOUS | Status: DC | PRN
  Administered 2013-04-20: 150 mg via INTRAVENOUS

## 2013-04-20 MED ORDER — HEPARIN SODIUM (PORCINE) 1000 UNIT/ML IJ SOLN
INTRAMUSCULAR | Status: DC | PRN
  Administered 2013-04-20: 13 mL via INTRAVENOUS
  Administered 2013-04-20: 2 mL via INTRAVENOUS

## 2013-04-20 MED ORDER — METOPROLOL TARTRATE 1 MG/ML IV SOLN: 2.5000 mg | INTRAVENOUS | Status: DC | PRN

## 2013-04-20 MED ORDER — PROPOFOL 10 MG/ML IV BOLUS
INTRAVENOUS | Status: AC
  Filled 2013-04-20: qty 20

## 2013-04-20 MED ORDER — FENTANYL CITRATE 0.05 MG/ML IJ SOLN
INTRAMUSCULAR | Status: AC
  Filled 2013-04-20: qty 5

## 2013-04-20 MED ORDER — ASPIRIN 81 MG PO CHEW
324.0000 mg | CHEWABLE_TABLET | Freq: Every day | ORAL | Status: DC
  Administered 2013-04-29: 324 mg
  Filled 2013-04-20: qty 4

## 2013-04-20 MED ORDER — INSULIN REGULAR BOLUS VIA INFUSION
0.0000 [IU] | Freq: Three times a day (TID) | INTRAVENOUS | Status: DC
  Filled 2013-04-20: qty 10

## 2013-04-20 MED ORDER — METOCLOPRAMIDE HCL 5 MG/ML IJ SOLN
10.0000 mg | Freq: Four times a day (QID) | INTRAMUSCULAR | Status: AC
  Administered 2013-04-20 – 2013-04-22 (×8): 10 mg via INTRAVENOUS
  Filled 2013-04-20 (×10): qty 2

## 2013-04-20 MED ORDER — LEVOFLOXACIN IN D5W 750 MG/150ML IV SOLN
750.0000 mg | INTRAVENOUS | Status: AC
  Administered 2013-04-21: 750 mg via INTRAVENOUS
  Filled 2013-04-20: qty 150

## 2013-04-20 MED ORDER — HYDROMORPHONE HCL PF 1 MG/ML IJ SOLN: 0.2500 mg | INTRAMUSCULAR | Status: DC | PRN

## 2013-04-20 MED ORDER — SODIUM CHLORIDE 0.9 % IV SOLN
INTRAVENOUS | Status: DC
  Filled 2013-04-20: qty 1

## 2013-04-20 MED ORDER — VECURONIUM BROMIDE 10 MG IV SOLR
INTRAVENOUS | Status: AC
  Filled 2013-04-20: qty 10

## 2013-04-20 MED ORDER — SODIUM CHLORIDE 0.9 % IJ SOLN
OROMUCOSAL | Status: DC | PRN
  Administered 2013-04-20 (×3): via TOPICAL

## 2013-04-20 MED ORDER — SODIUM CHLORIDE 0.9 % IV SOLN: 250.0000 mL | INTRAVENOUS | Status: DC

## 2013-04-20 MED ORDER — METOPROLOL TARTRATE 25 MG/10 ML ORAL SUSPENSION
12.5000 mg | Freq: Two times a day (BID) | ORAL | Status: DC
  Filled 2013-04-20 (×3): qty 5

## 2013-04-20 MED ORDER — LIDOCAINE HCL (CARDIAC) 20 MG/ML IV SOLN
INTRAVENOUS | Status: AC
  Filled 2013-04-20: qty 5

## 2013-04-20 MED ORDER — VANCOMYCIN HCL IN DEXTROSE 1-5 GM/200ML-% IV SOLN
1000.0000 mg | Freq: Once | INTRAVENOUS | Status: AC
  Administered 2013-04-20: 1000 mg via INTRAVENOUS
  Filled 2013-04-20: qty 200

## 2013-04-20 MED ORDER — ARTIFICIAL TEARS OP OINT
TOPICAL_OINTMENT | OPHTHALMIC | Status: AC
  Filled 2013-04-20: qty 3.5

## 2013-04-20 MED ORDER — SODIUM CHLORIDE 0.9 % IJ SOLN: 3.0000 mL | INTRAMUSCULAR | Status: DC | PRN

## 2013-04-20 MED ORDER — ACETAMINOPHEN 650 MG RE SUPP
650.0000 mg | Freq: Once | RECTAL | Status: AC
  Administered 2013-04-20: 650 mg via RECTAL

## 2013-04-20 MED ORDER — GLYCOPYRROLATE 0.2 MG/ML IJ SOLN
INTRAMUSCULAR | Status: AC
  Filled 2013-04-20: qty 2

## 2013-04-20 MED ORDER — PROMETHAZINE HCL 25 MG/ML IJ SOLN
12.5000 mg | INTRAMUSCULAR | Status: DC | PRN
  Administered 2013-04-21 – 2013-04-25 (×4): 12.5 mg via INTRAVENOUS
  Filled 2013-04-20 (×6): qty 1

## 2013-04-20 MED ORDER — SODIUM CHLORIDE 0.9 % IV SOLN
100.0000 [IU] | INTRAVENOUS | Status: DC | PRN
  Administered 2013-04-20: 1.8 [IU]/h via INTRAVENOUS

## 2013-04-20 MED ORDER — PANTOPRAZOLE SODIUM 40 MG PO TBEC
40.0000 mg | DELAYED_RELEASE_TABLET | Freq: Every day | ORAL | Status: DC
  Administered 2013-04-22 – 2013-05-05 (×14): 40 mg via ORAL
  Filled 2013-04-20 (×13): qty 1

## 2013-04-20 MED ORDER — HEPARIN SODIUM (PORCINE) 1000 UNIT/ML IJ SOLN
INTRAMUSCULAR | Status: AC
  Filled 2013-04-20: qty 1

## 2013-04-20 MED ORDER — ASPIRIN EC 325 MG PO TBEC
325.0000 mg | DELAYED_RELEASE_TABLET | Freq: Every day | ORAL | Status: DC
  Administered 2013-04-21 – 2013-05-05 (×14): 325 mg via ORAL
  Filled 2013-04-20 (×15): qty 1

## 2013-04-20 MED ORDER — ONDANSETRON HCL 4 MG/2ML IJ SOLN
4.0000 mg | Freq: Four times a day (QID) | INTRAMUSCULAR | Status: DC | PRN
  Administered 2013-04-20 – 2013-04-21 (×2): 4 mg via INTRAVENOUS
  Filled 2013-04-20: qty 2

## 2013-04-20 MED ORDER — DEXMEDETOMIDINE HCL 200 MCG/2ML IV SOLN
200.0000 ug | INTRAVENOUS | Status: DC | PRN
  Administered 2013-04-20: .3 ug/kg/h via INTRAVENOUS

## 2013-04-20 MED ORDER — SODIUM CHLORIDE 0.9 % IV SOLN
INTRAVENOUS | Status: DC
  Administered 2013-04-21: 20 mL/h via INTRAVENOUS

## 2013-04-20 MED ORDER — DEXMEDETOMIDINE HCL IN NACL 200 MCG/50ML IV SOLN
0.1000 ug/kg/h | INTRAVENOUS | Status: DC
  Administered 2013-04-20: 0.7 ug/kg/h via INTRAVENOUS
  Filled 2013-04-20: qty 50

## 2013-04-20 MED ORDER — BISACODYL 10 MG RE SUPP
10.0000 mg | Freq: Every day | RECTAL | Status: DC
  Filled 2013-04-20: qty 1

## 2013-04-20 MED ORDER — ROCURONIUM BROMIDE 100 MG/10ML IV SOLN
INTRAVENOUS | Status: DC | PRN
  Administered 2013-04-20: 100 mg via INTRAVENOUS

## 2013-04-20 MED ORDER — EPHEDRINE SULFATE 50 MG/ML IJ SOLN
INTRAMUSCULAR | Status: AC
  Filled 2013-04-20: qty 1

## 2013-04-20 MED ORDER — ROCURONIUM BROMIDE 50 MG/5ML IV SOLN
INTRAVENOUS | Status: AC
  Filled 2013-04-20: qty 2

## 2013-04-20 MED ORDER — MORPHINE SULFATE 2 MG/ML IJ SOLN
1.0000 mg | INTRAMUSCULAR | Status: AC | PRN
  Administered 2013-04-20: 4 mg via INTRAVENOUS
  Filled 2013-04-20: qty 2

## 2013-04-20 MED ORDER — OXYCODONE HCL 5 MG PO TABS
5.0000 mg | ORAL_TABLET | ORAL | Status: DC | PRN
  Administered 2013-04-21 – 2013-04-25 (×24): 10 mg via ORAL
  Administered 2013-04-25 – 2013-04-26 (×5): 5 mg via ORAL
  Administered 2013-04-26 – 2013-04-29 (×15): 10 mg via ORAL
  Administered 2013-04-29: 5 mg via ORAL
  Administered 2013-04-30 – 2013-05-02 (×17): 10 mg via ORAL
  Administered 2013-05-02: 5 mg via ORAL
  Administered 2013-05-02 – 2013-05-04 (×11): 10 mg via ORAL
  Filled 2013-04-20 (×13): qty 2
  Filled 2013-04-20: qty 1
  Filled 2013-04-20 (×10): qty 2
  Filled 2013-04-20: qty 1
  Filled 2013-04-20 (×13): qty 2
  Filled 2013-04-20: qty 1
  Filled 2013-04-20 (×4): qty 2
  Filled 2013-04-20: qty 1
  Filled 2013-04-20 (×15): qty 2
  Filled 2013-04-20: qty 1
  Filled 2013-04-20 (×11): qty 2
  Filled 2013-04-20: qty 1
  Filled 2013-04-20 (×2): qty 2

## 2013-04-20 MED ORDER — MORPHINE SULFATE 2 MG/ML IJ SOLN
2.0000 mg | INTRAMUSCULAR | Status: DC | PRN
  Administered 2013-04-20 (×2): 2 mg via INTRAVENOUS
  Administered 2013-04-20 – 2013-04-21 (×3): 4 mg via INTRAVENOUS
  Administered 2013-04-21: 2 mg via INTRAVENOUS
  Administered 2013-04-21 (×4): 4 mg via INTRAVENOUS
  Administered 2013-04-25: 2 mg via INTRAVENOUS
  Administered 2013-04-28: 4 mg via INTRAVENOUS
  Filled 2013-04-20: qty 2
  Filled 2013-04-20: qty 1
  Filled 2013-04-20: qty 2
  Filled 2013-04-20 (×3): qty 1
  Filled 2013-04-20 (×2): qty 2
  Filled 2013-04-20: qty 1
  Filled 2013-04-20 (×3): qty 2
  Filled 2013-04-20: qty 1
  Filled 2013-04-20: qty 2

## 2013-04-20 MED ORDER — SODIUM CHLORIDE 0.9 % IV SOLN
10.0000 g | INTRAVENOUS | Status: DC | PRN
  Administered 2013-04-20: 5 g/h via INTRAVENOUS

## 2013-04-20 MED ORDER — PROPOFOL 10 MG/ML IV BOLUS
INTRAVENOUS | Status: DC | PRN
  Administered 2013-04-20: 80 mg via INTRAVENOUS

## 2013-04-20 MED ORDER — LIDOCAINE HCL (CARDIAC) 20 MG/ML IV SOLN
INTRAVENOUS | Status: DC | PRN
  Administered 2013-04-20: 80 mg via INTRAVENOUS

## 2013-04-20 MED ORDER — SODIUM CHLORIDE 0.9 % IJ SOLN
3.0000 mL | Freq: Two times a day (BID) | INTRAMUSCULAR | Status: DC
  Administered 2013-04-21 – 2013-04-24 (×4): 3 mL via INTRAVENOUS

## 2013-04-20 MED ORDER — LACTATED RINGERS IV SOLN
INTRAVENOUS | Status: DC | PRN
  Administered 2013-04-20: 07:00:00 via INTRAVENOUS

## 2013-04-20 MED ORDER — FAMOTIDINE IN NACL 20-0.9 MG/50ML-% IV SOLN
20.0000 mg | Freq: Two times a day (BID) | INTRAVENOUS | Status: AC
  Administered 2013-04-20: 20 mg via INTRAVENOUS

## 2013-04-20 MED ORDER — ACETAMINOPHEN 500 MG PO TABS
1000.0000 mg | ORAL_TABLET | Freq: Four times a day (QID) | ORAL | Status: AC
  Administered 2013-04-21 – 2013-04-25 (×17): 1000 mg via ORAL
  Filled 2013-04-20 (×21): qty 2

## 2013-04-20 MED ORDER — MIDAZOLAM HCL 2 MG/2ML IJ SOLN
2.0000 mg | INTRAMUSCULAR | Status: DC | PRN
  Administered 2013-04-20: 2 mg via INTRAVENOUS
  Filled 2013-04-20: qty 2

## 2013-04-20 MED ORDER — ONDANSETRON HCL 4 MG/2ML IJ SOLN
4.0000 mg | Freq: Once | INTRAMUSCULAR | Status: DC | PRN
Start: 2013-04-20 — End: 2013-04-20
  Filled 2013-04-20: qty 2

## 2013-04-20 MED ORDER — DOCUSATE SODIUM 100 MG PO CAPS
200.0000 mg | ORAL_CAPSULE | Freq: Every day | ORAL | Status: DC
  Administered 2013-04-22 – 2013-05-05 (×13): 200 mg via ORAL
  Filled 2013-04-20 (×15): qty 2

## 2013-04-20 MED ORDER — MIDAZOLAM HCL 10 MG/2ML IJ SOLN
INTRAMUSCULAR | Status: AC
  Filled 2013-04-20: qty 2

## 2013-04-20 MED ORDER — VECURONIUM BROMIDE 10 MG IV SOLR
INTRAVENOUS | Status: DC | PRN
  Administered 2013-04-20 (×4): 5 mg via INTRAVENOUS

## 2013-04-20 MED ORDER — ACETAMINOPHEN 160 MG/5ML PO SOLN
1000.0000 mg | Freq: Four times a day (QID) | ORAL | Status: AC
  Filled 2013-04-20: qty 40

## 2013-04-20 MED ORDER — GLYCOPYRROLATE 0.2 MG/ML IJ SOLN
INTRAMUSCULAR | Status: DC | PRN
  Administered 2013-04-20 (×2): 0.4 mg via INTRAVENOUS

## 2013-04-20 MED ORDER — LACTATED RINGERS IV SOLN
INTRAVENOUS | Status: DC | PRN
  Administered 2013-04-20 (×2): via INTRAVENOUS

## 2013-04-20 MED ORDER — ALBUMIN HUMAN 5 % IV SOLN
250.0000 mL | INTRAVENOUS | Status: AC | PRN
  Administered 2013-04-20 (×2): 250 mL via INTRAVENOUS
  Filled 2013-04-20: qty 250

## 2013-04-20 MED ORDER — LACTATED RINGERS IV SOLN
INTRAVENOUS | Status: DC | PRN
  Administered 2013-04-20 (×2): via INTRAVENOUS

## 2013-04-20 MED ORDER — BISACODYL 5 MG PO TBEC
10.0000 mg | DELAYED_RELEASE_TABLET | Freq: Every day | ORAL | Status: DC
  Administered 2013-04-22 – 2013-05-05 (×11): 10 mg via ORAL
  Filled 2013-04-20 (×11): qty 2

## 2013-04-20 MED ORDER — POTASSIUM CHLORIDE 10 MEQ/50ML IV SOLN
10.0000 meq | INTRAVENOUS | Status: AC
  Administered 2013-04-20 (×3): 10 meq via INTRAVENOUS

## 2013-04-20 MED ORDER — PHENYLEPHRINE HCL 10 MG/ML IJ SOLN
10.0000 mg | INTRAVENOUS | Status: DC | PRN
  Administered 2013-04-20: 25 ug/min via INTRAVENOUS

## 2013-04-20 MED ORDER — SODIUM CHLORIDE 0.45 % IV SOLN
INTRAVENOUS | Status: DC
  Administered 2013-04-20: 20 mL/h via INTRAVENOUS

## 2013-04-20 MED ORDER — MIDAZOLAM HCL 5 MG/5ML IJ SOLN
INTRAMUSCULAR | Status: DC | PRN
  Administered 2013-04-20 (×2): 2 mg via INTRAVENOUS
  Administered 2013-04-20: 3 mg via INTRAVENOUS

## 2013-04-20 MED ORDER — PHENYLEPHRINE HCL 10 MG/ML IJ SOLN
0.0000 ug/min | INTRAVENOUS | Status: DC
  Filled 2013-04-20: qty 2

## 2013-04-20 MED ORDER — ACETAMINOPHEN 160 MG/5ML PO SOLN: 650.0000 mg | Freq: Once | ORAL | Status: AC

## 2013-04-20 MED ORDER — LACTATED RINGERS IV SOLN: INTRAVENOUS | Status: DC

## 2013-04-20 MED ORDER — ALBUMIN HUMAN 5 % IV SOLN
INTRAVENOUS | Status: DC | PRN
  Administered 2013-04-20: 13:00:00 via INTRAVENOUS

## 2013-04-20 MED ORDER — HEMOSTATIC AGENTS (NO CHARGE) OPTIME
TOPICAL | Status: DC | PRN
  Administered 2013-04-20: 1 via TOPICAL

## 2013-04-20 MED ORDER — NITROGLYCERIN IN D5W 200-5 MCG/ML-% IV SOLN: 0.0000 ug/min | INTRAVENOUS | Status: DC

## 2013-04-20 MED ORDER — PLASMA-LYTE 148 IV SOLN
INTRAVENOUS | Status: DC | PRN
  Administered 2013-04-20: 09:00:00 via INTRAVASCULAR

## 2013-04-20 MED ORDER — LACTATED RINGERS IV SOLN: 500.0000 mL | Freq: Once | INTRAVENOUS | Status: AC | PRN

## 2013-04-20 SURGICAL SUPPLY — 118 items
ADAPTER CARDIO PERF ANTE/RETRO (ADAPTER) IMPLANT
APPLIER CLIP 5 13 M/L LIGAMAX5 (MISCELLANEOUS) ×5
APPLIER CLIP 9.375 MED OPEN (MISCELLANEOUS)
APPLIER CLIP 9.375 SM OPEN (CLIP) ×10
ATTRACTOMAT 16X20 MAGNETIC DRP (DRAPES) ×5 IMPLANT
BAG DECANTER FOR FLEXI CONT (MISCELLANEOUS) ×5 IMPLANT
BANDAGE ELASTIC 4 VELCRO ST LF (GAUZE/BANDAGES/DRESSINGS) IMPLANT
BANDAGE ELASTIC 6 VELCRO ST LF (GAUZE/BANDAGES/DRESSINGS) IMPLANT
BANDAGE GAUZE ELAST BULKY 4 IN (GAUZE/BANDAGES/DRESSINGS) IMPLANT
BASKET HEART  (ORDER IN 25'S) (MISCELLANEOUS)
BASKET HEART (ORDER IN 25'S) (MISCELLANEOUS)
BASKET HEART (ORDER IN 25S) (MISCELLANEOUS) IMPLANT
BLADE STERNUM SYSTEM 6 (BLADE) ×5 IMPLANT
BLADE SURG 11 STRL SS (BLADE) IMPLANT
BLADE SURG ROTATE 9660 (MISCELLANEOUS) IMPLANT
CANISTER SUCTION 2500CC (MISCELLANEOUS) ×5 IMPLANT
CANNULA GUNDRY RCSP 15FR (MISCELLANEOUS) IMPLANT
CARDIAC SUCTION (MISCELLANEOUS) ×5 IMPLANT
CATH ROBINSON RED A/P 18FR (CATHETERS) ×5 IMPLANT
CATH THORACIC 36FR RT ANG (CATHETERS) IMPLANT
CLEANER TIP ELECTROSURG 2X2 (MISCELLANEOUS) ×5 IMPLANT
CLIP APPLIE 5 13 M/L LIGAMAX5 (MISCELLANEOUS) ×3 IMPLANT
CLIP APPLIE 9.375 MED OPEN (MISCELLANEOUS) IMPLANT
CLIP APPLIE 9.375 SM OPEN (CLIP) ×6 IMPLANT
CLIP FOGARTY SPRING 6M (CLIP) IMPLANT
CLIP RETRACTION 3.0MM CORONARY (MISCELLANEOUS) IMPLANT
CLIP TI MEDIUM 24 (CLIP) IMPLANT
CLIP TI WIDE RED SMALL 24 (CLIP) IMPLANT
CONN Y 3/8X3/8X3/8  BEN (MISCELLANEOUS)
CONN Y 3/8X3/8X3/8 BEN (MISCELLANEOUS) IMPLANT
CONT SPEC 4OZ CLIKSEAL STRL BL (MISCELLANEOUS) ×35 IMPLANT
CORONARY SUCKER SOFT TIP 10052 (MISCELLANEOUS) ×5 IMPLANT
COVER SURGICAL LIGHT HANDLE (MISCELLANEOUS) ×10 IMPLANT
CRADLE DONUT ADULT HEAD (MISCELLANEOUS) ×5 IMPLANT
DRAPE CARDIOVASCULAR INCISE (DRAPES) ×2
DRAPE SLUSH/WARMER DISC (DRAPES) ×5 IMPLANT
DRAPE SRG 135X102X78XABS (DRAPES) ×3 IMPLANT
DRSG AQUACEL AG ADV 3.5X14 (GAUZE/BANDAGES/DRESSINGS) ×5 IMPLANT
DRSG COVADERM 4X14 (GAUZE/BANDAGES/DRESSINGS) IMPLANT
ELECT CAUTERY BLADE 6.4 (BLADE) IMPLANT
ELECT REM PT RETURN 9FT ADLT (ELECTROSURGICAL) ×10
ELECTRODE REM PT RTRN 9FT ADLT (ELECTROSURGICAL) ×6 IMPLANT
GLOVE BIO SURGEON STRL SZ 6.5 (GLOVE) ×32 IMPLANT
GLOVE BIO SURGEON STRL SZ7.5 (GLOVE) ×10 IMPLANT
GLOVE BIO SURGEONS STRL SZ 6.5 (GLOVE) ×8
GLOVE EUDERMIC 7 POWDERFREE (GLOVE) IMPLANT
GOWN STRL NON-REIN LRG LVL3 (GOWN DISPOSABLE) ×50 IMPLANT
HANDLE STAPLE ENDO GIA SHORT (STAPLE) ×2
HANDLE UNIV ENDO GIA (ENDOMECHANICALS) ×5 IMPLANT
HEMOSTAT POWDER SURGIFOAM 1G (HEMOSTASIS) ×15 IMPLANT
HEMOSTAT SURGICEL 2X14 (HEMOSTASIS) ×5 IMPLANT
INSERT FOGARTY 61MM (MISCELLANEOUS) ×5 IMPLANT
INSERT FOGARTY XLG (MISCELLANEOUS) ×5 IMPLANT
KIT BASIN OR (CUSTOM PROCEDURE TRAY) ×5 IMPLANT
KIT ROOM TURNOVER OR (KITS) ×5 IMPLANT
KIT SUCTION CATH 14FR (SUCTIONS) ×5 IMPLANT
KIT VASOVIEW W/TROCAR VH 2000 (KITS) ×5 IMPLANT
LINE VENT (MISCELLANEOUS) IMPLANT
MARKER GRAFT CORONARY BYPASS (MISCELLANEOUS) ×5 IMPLANT
MARKER SKIN DUAL TIP RULER LAB (MISCELLANEOUS) IMPLANT
NS IRRIG 1000ML POUR BTL (IV SOLUTION) ×35 IMPLANT
PACK OPEN HEART (CUSTOM PROCEDURE TRAY) ×5 IMPLANT
PAD ARMBOARD 7.5X6 YLW CONV (MISCELLANEOUS) ×10 IMPLANT
PEDIATRIC SUCKERS (MISCELLANEOUS) IMPLANT
PENCIL BUTTON HOLSTER BLD 10FT (ELECTRODE) IMPLANT
PUNCH AORTIC ROTATE 4.0MM (MISCELLANEOUS) IMPLANT
PUNCH AORTIC ROTATE 4.5MM 8IN (MISCELLANEOUS) IMPLANT
PUNCH AORTIC ROTATE 5MM 8IN (MISCELLANEOUS) IMPLANT
RELOAD EGIA 45 MED/THCK PURPLE (STAPLE) ×20 IMPLANT
RELOAD EGIA 60 MED/THCK PURPLE (STAPLE) ×15 IMPLANT
RELOAD EGIA TRIS TAN 45 CVD (STAPLE) ×25 IMPLANT
SEALANT PROGEL (MISCELLANEOUS) ×5 IMPLANT
SET CARDIOPLEGIA MPS 5001102 (MISCELLANEOUS) IMPLANT
SOLUTION ANTI FOG 6CC (MISCELLANEOUS) IMPLANT
SPONGE GAUZE 4X4 12PLY (GAUZE/BANDAGES/DRESSINGS) ×5 IMPLANT
STABILIZER COHN CARD 89 9340 (MISCELLANEOUS) IMPLANT
STAPLER ENDO GIA 12MM SHORT (STAPLE) ×3 IMPLANT
STOPCOCK 4 WAY LG BORE MALE ST (IV SETS) IMPLANT
SUT BONE WAX W31G (SUTURE) ×5 IMPLANT
SUT MNCRL AB 4-0 PS2 18 (SUTURE) IMPLANT
SUT PROLENE 3 0 SH DA (SUTURE) IMPLANT
SUT PROLENE 4 0 RB 1 (SUTURE)
SUT PROLENE 4 0 SH DA (SUTURE) IMPLANT
SUT PROLENE 4-0 RB1 .5 CRCL 36 (SUTURE) IMPLANT
SUT PROLENE 6 0 C 1 30 (SUTURE) ×10 IMPLANT
SUT PROLENE 7 0 BV 1 (SUTURE) ×10 IMPLANT
SUT PROLENE 8 0 BV175 6 (SUTURE) ×10 IMPLANT
SUT SILK  1 MH (SUTURE) ×6
SUT SILK 1 MH (SUTURE) ×9 IMPLANT
SUT SILK 1 SH (SUTURE) IMPLANT
SUT SILK 1 TIES 10X30 (SUTURE) ×5 IMPLANT
SUT STEEL 6MS V (SUTURE) IMPLANT
SUT STEEL STERNAL CCS#1 18IN (SUTURE) IMPLANT
SUT STEEL SZ 6 DBL 3X14 BALL (SUTURE) ×15 IMPLANT
SUT VIC AB 1 CTX 36 (SUTURE) ×4
SUT VIC AB 1 CTX36XBRD ANBCTR (SUTURE) ×6 IMPLANT
SUT VIC AB 2-0 CT1 27 (SUTURE)
SUT VIC AB 2-0 CT1 TAPERPNT 27 (SUTURE) IMPLANT
SUT VIC AB 2-0 CTX 27 (SUTURE) IMPLANT
SUT VIC AB 3-0 SH 27 (SUTURE) ×12
SUT VIC AB 3-0 SH 27X BRD (SUTURE) ×18 IMPLANT
SUT VIC AB 3-0 X1 27 (SUTURE) IMPLANT
SUT VICRYL 4-0 PS2 18IN ABS (SUTURE) IMPLANT
SUTURE E-PAK OPEN HEART (SUTURE) ×5 IMPLANT
SYS GUIDANT ACHIEVE OFF PUMP (MISCELLANEOUS) ×5 IMPLANT
SYSTEM HEARTSTRING SEAL 3.8 (VASCULAR PRODUCTS) ×6 IMPLANT
SYSTEM HEARTSTRING SEAL 3.8MM (VASCULAR PRODUCTS) ×4
SYSTEM SAHARA CHEST DRAIN ATS (WOUND CARE) ×5 IMPLANT
TAPE PAPER 3X10 WHT MICROPORE (GAUZE/BANDAGES/DRESSINGS) ×5 IMPLANT
TAPES RETRACTO (MISCELLANEOUS) ×5 IMPLANT
TOWEL OR 17X24 6PK STRL BLUE (TOWEL DISPOSABLE) ×5 IMPLANT
TOWEL OR 17X26 10 PK STRL BLUE (TOWEL DISPOSABLE) ×5 IMPLANT
TRAY FOLEY IC TEMP SENS 14FR (CATHETERS) ×5 IMPLANT
TUBING INSUFFLATION 10FT LAP (TUBING) ×5 IMPLANT
TUNNELER SHEATH ON-Q 11GX8 DSP (PAIN MANAGEMENT) ×5 IMPLANT
UNDERPAD 30X30 INCONTINENT (UNDERPADS AND DIAPERS) ×5 IMPLANT
WAND VISUFLOW SURG W/TUBING (MISCELLANEOUS) IMPLANT
WATER STERILE IRR 1000ML POUR (IV SOLUTION) ×10 IMPLANT

## 2013-04-20 NOTE — Progress Notes (Signed)
Dr Roxan Hockey updated cpap ABG, MD ok to proceed with extubation. Reather Laurence

## 2013-04-20 NOTE — Brief Op Note (Addendum)
      [address]       [place] [address]             [phone]     04/20/2013  12:53 PM  PATIENT:  Troy [name]  73 y.o. male  PRE-OPERATIVE DIAGNOSIS:  CAD RUL LUNG  MASS  POST-OPERATIVE DIAGNOSIS:  CAD RUL LUNG  MASS  PROCEDURE:  Procedure(s): OFF PUMP CORONARY ARTERY BYPASS GRAFTING (CABG)X1 FREE RIMA-RCA INTRAOPERATIVE TRANSESOPHAGEAL ECHOCARDIOGRAM RIGHT UPPER LOBECTOMY (RUL)   SURGEON:  Surgeon(s): Melrose Nakayama, MD  PHYSICIAN ASSISTANT: WAYNE GOLD PA-C  ANESTHESIA:   general  PATIENT CONDITION:  ICU - intubated and hemodynamically stable.  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

## 2013-04-20 NOTE — H&P (View-Only) (Signed)
PCP is Pcp Not In System Referring Provider is Tanda Rockers, MD  Chief Complaint  Patient presents with  . Lung Lesion    eval and treat..CT CHEST/PET...SPIROMETY ONLY    HPI: 73 yo WM with a history of tobacco abuse and CAD presents with a cc/o chest pain with exertion  Mr. Grasmick is a 73 year old gentleman with a long history of coronary disease including angioplasty in the 1990s. He also has a history of tobacco abuse with one pack a day from age [age] (45 pack years), he quit in 2004. He was in his usual state of health until last fall when he began having chest pain with exertion. He first noted this when he was trying to clean his gutters and rake leaves. This has now progressed to the point where he gets chest pain and associated shortness of breath with even minimal activity such as showering or walking around his house. He saw Dr. Johnsie Cancel and as part of his workup had a chest x-ray which revealed a right upper lobe mass.  A CT of the chest was done which showed a 2.1 cm spiculated mass in the right upper lobe. There was some central lobular emphysema primarily in the upper lobes. There was no mediastinal or hilar adenopathy. There also were extensive coronary calcifications.  He ultimately had a PET/CT which showed the lesion to be hypermetabolic. There was no evidence of regional or distant metastases.  As part of his workup for chest pain he had cardiac catheterization which revealed a totally occluded right coronary. The distal vessel was well collateralized and there was no significant disease in the LAD or circumflex. However he failed medical therapy with continued angina which has now progressed to the point of occurring with even minimal activity. He has not had any rest or nocturnal angina.  Even before his chest pain became the limiting factor his activities were somewhat limited by arthritis in his knees and chronic back pain. He is followed in a pain clinic for back pain. He  takes hydrocodone and Ultram for that. He has lost about 20 pounds over the past 3 months. He also has had decreased energy for several months. He sleeps on 2 pillows. 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 He has a productive cough with clear phlegm. He has not had any hemoptysis. He does not have a wheezing.  ECOG/ZUBROD= 1   Past Medical History  Diagnosis Date  . CAD (coronary artery disease)   . HTN (hypertension)   . COPD (chronic obstructive pulmonary disease)   . Hyperlipidemia   . Depression   . Vitamin D deficiency   . Sleep apnea     Past Surgical History  Procedure Laterality Date  . Cholecystectomy    . Hernia repair      x 11  . Angioplasty    . Cardiac catheterization      multiple  . Carpal tunnel release Right   . Spinal cord stimulator implant    . Back surgery    . Vagotomy    . Appendectomy    . Gastrectomy    . Nissen fundoplication      Family History  Problem Relation Age of Onset  . Lung cancer Mother     worked at a [employer]  . Heart disease Mother   . Stomach cancer Maternal Grandmother   . Brain cancer Father     Social History History  Substance Use Topics  .  Smoking status: Former Smoker -- 1.00 packs/day for 25 years    Types: Cigarettes    Quit date: 03/23/2002  . Smokeless tobacco: Never Used  . Alcohol Use: No    Current Outpatient Prescriptions  Medication Sig Dispense Refill  . aspirin 325 MG tablet Take 325 mg by mouth daily as needed (for chest pain).      . carvedilol (COREG) 6.25 MG tablet 2 (two) times daily with a meal.       . diazepam (VALIUM) 10 MG tablet Take 10 mg by mouth 2 (two) times daily as needed for anxiety (for back spasms).      Marland Kitchen HYDROcodone-acetaminophen (NORCO) 10-325 MG per tablet Take 1 tablet by mouth every 6 (six) hours as needed (for pain). prn      . isosorbide mononitrate (IMDUR) 30 MG 24 hr tablet Take 15 mg by mouth daily.      . montelukast (SINGULAIR) 10 MG  tablet Take 1 tablet by mouth daily.      . ranolazine (RANEXA) 500 MG 12 hr tablet Take 1 tablet (500 mg total) by mouth 2 (two) times daily.  60 tablet  6  . traMADol (ULTRAM) 50 MG tablet Take 50-100 mg by mouth every 6 (six) hours as needed (for pain).       . vitamin B-12 (CYANOCOBALAMIN) 1000 MCG tablet Take 1,000 mcg by mouth daily.      . vitamin E 400 UNIT capsule Take 400 Units by mouth daily.      Marland Kitchen zolpidem (AMBIEN) 10 MG tablet Take 1 tablet by mouth at bedtime as needed for sleep.        No current facility-administered medications for this visit.    Allergies  Allergen Reactions  . Augmentin [Amoxicillin-Pot Clavulanate] Other (See Comments)    "passed out"  . Flagyl [Metronidazole] Other (See Comments)    "passed out"  . Tape Other (See Comments)    blisters    Review of Systems  Constitutional: Positive for unexpected weight change (20 pounds in 3 months).       Chronic pain due to back - followed in pain clinic  HENT: Positive for dental problem (dentures).   Respiratory: Positive for cough (no hemoptysis) and shortness of breath (with chest pain). Negative for wheezing.   Cardiovascular: Positive for chest pain (with minimal exertion).       2 pillow orthopnea  Gastrointestinal:       Subtotal gastrectomy and 3 fundoplications, last via left chest  Genitourinary:       Polycystic kidney disease  Musculoskeletal: Positive for arthralgias (knees) and back pain.       Leg cramps  Hematological: Bruises/bleeds easily.  All other systems reviewed and are negative.    BP 151/94  Pulse 79  Resp 16  Ht 6' (1.829 m)  Wt 167 lb (75.751 kg)  BMI 22.64 kg/m2  SpO2 96% Physical Exam  Vitals reviewed. Constitutional: He is oriented to person, place, and time. No distress.  thin  HENT:  Head: Normocephalic and atraumatic.  Eyes: EOM are normal. Pupils are equal, round, and reactive to light.  Neck: Neck supple. No thyromegaly present.  No carotid bruits   Cardiovascular: Normal rate, regular rhythm, normal heart sounds and intact distal pulses.   Varicose veins bilaterally  Pulmonary/Chest: Effort normal and breath sounds normal. He has no wheezes. He has no rales.  Abdominal: Soft. There is no tenderness.  Mesh palpable under skin upper midline  Musculoskeletal: He exhibits  edema (trace). He exhibits no tenderness.  Lymphadenopathy:    He has no cervical adenopathy.  Neurological: He is alert and oriented to person, place, and time. No cranial nerve deficit.  Skin: Skin is warm and dry.     Diagnostic Tests: CARDIAC CATHETERIZATION Hemodynamics:  AO 137 69  LV 151 7 post EDP 17  Coronary angiography:  Coronary dominance: right  Left mainstem: Short segment normal  Left anterior descending (LAD): Heavily calcified. 50% diffuse disease proximally 80% ostial first septal perforator Large vessel wraps apex  D1: normal  D2: small and normal  D3: normal  Left circumflex (LCx): Normal proximally AV groove ostium 60-70%  OM1: small and normal  OM2: large with 30% ostial stenosis at AV groove take off  Right coronary artery (RCA): Heavily calcified 100% occluded in proximal portion. Good collaterals from left  Left ventriculography: Left ventricular systolic function is normal, LVEF is estimated at 55-65%, there is no significant mitral regurgitation  inferobasal akinesis  Final Conclusions: Essentially single vessel disease RCA occluded with good collaterals Will have Dr Martinique review but given question  Of lung cancer feel medical Rx indicated with further w/u by pulmonary for CA Needs f/u PET/CT  Recommendations: Medical Rx Can consider CTO of RCA if he has lots of chest pain  CT CHEST CT CHEST WITHOUT CONTRAST  TECHNIQUE:  Multidetector CT imaging of the chest was performed following the  standard protocol without IV contrast.  COMPARISON: Chest radiograph from 02/24/2013  FINDINGS:  No pleural effusion identified. Moderate to  advanced changes of  centrilobular and paraseptal emphysema identified. Pulmonary nodule  within the right upper lobe measures 2.1 cm, image 28/ series 3.  This corresponds with the chest radiograph abnormality.  There is a normal heart size. Calcifications involving the thoracic  aorta noted. There also calcifications involving the RCA, LAD and  left circumflex coronary arteries.  No mediastinal or hilar adenopathy identified. There is no axillary  or supraclavicular adenopathy.  Incidental imaging through the upper abdomen shows changes from  prior coli cystectomy. There is increase caliber of the common bile  duct which measures up to 1.2 cm. There are multiple cysts within  both kidneys which are of varying complexity. Cyst arising from the  upper pole of the right kidney contains an internal area of  septation.  IMPRESSION:  1. Right upper lobe pulmonary nodule is suspicious. Recommend  further evaluation with PET-CT and possible tissue sampling.  2. Emphysema  3. Calcified atherosclerotic disease including multi vessel coronary  artery calcifications.  4. Increase caliber of the common bile duct status post  cholecystectomy.  5. Bilateral renal cysts of varying complexity. Consider further  evaluation with pre and post-contrast cross-sectional imaging  through the kidneys. And MRI would be the study of choice.  Electronically Signed  By: Kerby Moors M.D.  On: 02/28/2013 17:17   PET/ CT NUCLEAR MEDICINE PET SKULL BASE TO THIGH  FASTING BLOOD GLUCOSE: Value: 108mg /dl  TECHNIQUE:  18.8 mCi F-18 FDG was injected intravenously. CT data was obtained  and used for attenuation correction and anatomic localization only.  (This was not acquired as a diagnostic CT examination.) Additional  exam technical data entered on technologist worksheet.  COMPARISON: 02/28/2013  FINDINGS:  NECK  No hypermetabolic lymph nodes in the neck.  CHEST  There are moderate changes of paraseptal  and centrilobular emphysema  identified. In the right upper lobe there is a pulmonary nodule  which measures 2.1 cm, image 98/series 2. The  SUV max associated  with this nodule is equal to 6.0. Adjacent subpleural nodule  measures 6 mm, image 102/series 2. This is too small to reliably  characterize.  The heart size appears normal. There is no pericardial effusion.  Calcified atherosclerotic disease affects the thoracic aorta as well  as the RCA, LAD and left circumflex coronary arteries. There is no  mediastinal or hilar adenopathy identified. No hypermetabolic  axillary or supraclavicular lymph nodes noted.  ABDOMEN/PELVIS  No abnormal hypermetabolic activity within the liver, pancreas,  adrenal glands, or spleen. No hypermetabolic lymph nodes in the  abdomen or pelvis. The patient is status post cholecystectomy. There  is mild increase caliber of the common bile duct.  There are multiple bilateral renal cysts of varying densities. These  are incompletely characterized without IV contrast material.  Calcified atherosclerotic disease affects the abdominal aorta. There  is no aneurysm. No enlarged or hypermetabolic lymph nodes identified  within the upper abdomen or the pelvis.  SKELETON  The bones appear osteopenic. There is multi level spondylosis  involving the thoracic and lumbar spine. Previous lumbar laminectomy  and posterior fusion surgery noted.  IMPRESSION:  1. 2.1 cm hypermetabolic nodule within the right upper lobe is  worrisome for primary lung neoplasm. Correlation with tissue  sampling advised.  2. No evidence for hypermetabolic adenopathy or distant metastatic  disease.  3. Emphysema.  4. Atherosclerotic disease including multi vessel coronary artery  calcifications.  5. Multiple, bilateral renal cysts. These are of varying densities  and incompletely characterized without IV contrast material.  Electronically Signed  By: Kerby Moors M.D.  On: 03/14/2013  12:32    PFTs FVC= 3.82(79%) FEV1 = 2.97(81%) FEV1/FVC= 76% MVV 136  Impression: 73 year old gentleman with severe single-vessel coronary disease and a newly discovered 2.1 cm right upper lobe mass. The right upper lobe mass is highly suspicious for, and almost certainly represents, a new primary bronchogenic carcinoma. The question is how to best deal with both of these problems and wants sequence to do so.  He only has single-vessel disease and has good collaterals to the distal branches of the right coronary. However he has failed maximal medical therapy for that and needs revascularization. Percutaneous intervention is not possible, therefore coronary bypass grafting is the best option. His heart needs to be addressed either prior to or at the same time as his lung mass. He would be a high risk for perioperative MI we were to try to do the lung resection first.  The lung mass is resectable. The only complicating factor with the lung nodule is that it sits right on the minor fissure. It peripheral enough lesion that I think we can resect it with a upper lobectomy although we may have to take a portion of the middle lobe. There's an outside chance he would require a bilobectomy (upper and middle). His lung function is more than adequate to tolerate the loss of parenchyma with either of those resections.  I discussed the options with Mr. Tory and his brother. We discussed proceeding with coronary bypass grafting first and then coming back at a later time to do the lung resection versus a combined procedure. Both of those approaches have advantages and disadvantages. He very strongly wants to have both done at the same time if possible. I informed him that we could plan to do so, but if there are any issues encountered at surgery, we may have to defer the lung resection to a later date.  I discussed  the proposed operation which is coronary artery bypass grafting using a right mammary artery to the  right coronary artery and right upper lobectomy (possible bilobectomy) with Mr. Bethard and his brother. We discussed the possibility of off-pump grafting if technically possible. I think it's a 50-50 chance that OPCAB with the RIMA would be feasible in his case. We will plan to do the lung resection as well as technical issues determined that we should return at a later date. We discussed the general nature of the procedure, the need for general anesthesia, the expected hospital stay, and overall recovery.  We discussed the indications, risks, benefits, and alternatives. He understands that the risks of surgery include but are not limited to death, stroke, MI, DVT, PE, bleeding, possible need for transfusion, cardiac arrhythmias, infections, air leaks, other organ system dysfunction including respiratory or renal failure or gastrointestinal complications. He accepts these risks and wishes to proceed.  He wishes to wait at least 2 weeks to have the surgery done. I advised him not to put this off too long, but for now we'll plan to proceed on Thursday, January 29.  Plan: Coronary artery bypass grafting x1, possible OPCAB, possible right upper lobectomy on Thursday, January 29.

## 2013-04-20 NOTE — Procedures (Signed)
Extubation Procedure Note  Patient Details:   Name: Troy [name] DOB: [birth year] MRN: [id number]  Pt extubated to [facility] after successful SICU rapid wean.  Pt has good cough and able to vocalize.   Evaluation  O2 sats: stable throughout Complications: No apparent complications Patient did tolerate procedure well. Bilateral Breath Sounds: Clear;Diminished   Yes  Raman Featherston Apple 04/20/2013, 7:00 PM

## 2013-04-20 NOTE — Anesthesia Preprocedure Evaluation (Addendum)
Anesthesia Evaluation  Patient identified by MRN, date of birth, ID band Patient awake    Reviewed: Allergy & Precautions, H&P , NPO status , Patient's Chart, lab work & pertinent test results, reviewed documented beta blocker date and time   Airway Mallampati: I TM Distance: >3 FB Neck ROM: Full    Dental  (+) Edentulous Upper and Edentulous Lower   Pulmonary shortness of breath and with exertion, sleep apnea , former smoker,          Cardiovascular hypertension, + CAD Rhythm:Regular Rate:Normal  EF 55% to 65%   Neuro/Psych Depression    GI/Hepatic   Endo/Other    Renal/GU      Musculoskeletal   Abdominal   Peds  Hematology   Anesthesia Other Findings   Reproductive/Obstetrics                          Anesthesia Physical Anesthesia Plan  ASA: III  Anesthesia Plan: General   Post-op Pain Management:    Induction: Intravenous  Airway Management Planned: Oral ETT and Double Lumen EBT  Additional Equipment: Arterial line, CVP, PA Cath, TEE, 3D TEE and Ultrasound Guidance Line Placement  Intra-op Plan:   Post-operative Plan: Post-operative intubation/ventilation  Informed Consent: I have reviewed the patients History and Physical, chart, labs and discussed the procedure including the risks, benefits and alternatives for the proposed anesthesia with the patient or authorized representative who has indicated his/her understanding and acceptance.     Plan Discussed with: CRNA, Anesthesiologist and Surgeon  Anesthesia Plan Comments:         Anesthesia Quick Evaluation

## 2013-04-20 NOTE — Anesthesia Postprocedure Evaluation (Signed)
  Anesthesia Post-op Note  Patient: Troy [name]  Procedure(s) Performed: Procedure(s) with comments: OFF PUMP CORONARY ARTERY BYPASS GRAFTING (CABG) (N/A) - CABG X 1  POSSIBLE OFF PUMP INTRAOPERATIVE TRANSESOPHAGEAL ECHOCARDIOGRAM (N/A) LOBECTOMY - Right Upper Lobectomy  Patient Location: SICU  Anesthesia Type:General  Level of Consciousness: Patient remains intubated per anesthesia plan  Airway and Oxygen Therapy: Patient remains intubated per anesthesia plan and Patient placed on Ventilator (see vital sign flow sheet for setting)  Post-op Pain: none  Post-op Assessment: Post-op Vital signs reviewed, Patient's Cardiovascular Status Stable, Respiratory Function Stable, Patent Airway, No signs of Nausea or vomiting and Pain level controlled  Post-op Vital Signs: stable  Complications: No apparent anesthesia complications

## 2013-04-20 NOTE — Progress Notes (Signed)
S/p CABG x 1, RUL  C/o dry heaves- unable to throw up due to Nissen  BP 97/63  Pulse 56  Temp(Src) 98.8 F (37.1 C) (Core (Comment))  Resp 14  Wt 169 lb 1.5 oz (76.7 kg)  SpO2 98%  CI= 2.2   Intake/Output Summary (Last 24 hours) at 04/20/13 1908 Last data filed at 04/20/13 1815  Gross per 24 hour  Intake 4920.67 ml  Output   2315 ml  Net 2605.67 ml    Attempted to place NG at his request. Passed easily into esophagus but was unable to get it to pass into the stomach  Will add reglan and phenergan  Has a small air leak

## 2013-04-20 NOTE — Progress Notes (Signed)
Dr Roxan Hockey updated CXR report on arrival to unit and swan positioning per xray report. Updated fluids infusing thru swan. Will continue to monitor. Reather Laurence

## 2013-04-20 NOTE — Transfer of Care (Signed)
Immediate Anesthesia Transfer of Care Note  Patient: Troy [name]  Procedure(s) Performed: Procedure(s) with comments: OFF PUMP CORONARY ARTERY BYPASS GRAFTING (CABG) (N/A) - CABG X 1  POSSIBLE OFF PUMP INTRAOPERATIVE TRANSESOPHAGEAL ECHOCARDIOGRAM (N/A) LOBECTOMY - Right Upper Lobectomy  Patient Location: SICU  Anesthesia Type:General  Level of Consciousness: Patient remains intubated per anesthesia plan  Airway & Oxygen Therapy: Patient remains intubated per anesthesia plan  Post-op Assessment: Report given to PACU RN  Post vital signs: Reviewed and stable  Complications: No apparent anesthesia complications

## 2013-04-20 NOTE — Preoperative (Signed)
Beta Blockers   Reason not to administer Beta Blockers:Not Applicable 

## 2013-04-20 NOTE — Progress Notes (Signed)
  Echocardiogram Echocardiogram Transesophageal has been performed.  Troy [name] 04/20/2013, 12:47 PM

## 2013-04-20 NOTE — Interval H&P Note (Signed)
History and Physical Interval Note:  04/20/2013 7:54 AM  Troy [name]  has presented today for surgery, with the diagnosis of CAD RUL LUNG  MASS  The various methods of treatment have been discussed with the patient and family. After consideration of risks, benefits and other options for treatment, the patient has consented to  Procedure(s) with comments: OFF PUMP CORONARY ARTERY BYPASS GRAFTING (CABG) (N/A) - CABG X 1  POSSIBLE OFF PUMP INTRAOPERATIVE TRANSESOPHAGEAL ECHOCARDIOGRAM (N/A) LOBECTOMY (Right) - POSSIBLE RIGHT UPPER LOBECTOMY as a surgical intervention .  The patient's history has been reviewed, patient examined, no change in status, stable for surgery.  I have reviewed the patient's chart and labs.  Questions were answered to the patient's satisfaction.     Elizabelle Fite C

## 2013-04-21 ENCOUNTER — Inpatient Hospital Stay (HOSPITAL_COMMUNITY): Payer: Medicare Other

## 2013-04-21 ENCOUNTER — Encounter (HOSPITAL_COMMUNITY): Payer: Self-pay | Admitting: Thoracic Surgery (Cardiothoracic Vascular Surgery)

## 2013-04-21 LAB — GLUCOSE, CAPILLARY
GLUCOSE-CAPILLARY: 108 mg/dL — AB (ref 70–99)
GLUCOSE-CAPILLARY: 124 mg/dL — AB (ref 70–99)
GLUCOSE-CAPILLARY: 100 mg/dL — AB (ref 70–99)
Glucose-Capillary: 103 mg/dL — ABNORMAL HIGH (ref 70–99)
Glucose-Capillary: 99 mg/dL (ref 70–99)
Glucose-Capillary: 132 mg/dL — ABNORMAL HIGH (ref 70–99)
Glucose-Capillary: 94 mg/dL (ref 70–99)

## 2013-04-21 LAB — POCT I-STAT, CHEM 8
BUN: 11 mg/dL (ref 6–23)
CHLORIDE: 99 meq/L (ref 96–112)
Calcium, Ion: 1.27 mmol/L (ref 1.13–1.30)
Creatinine, Ser: 0.9 mg/dL (ref 0.50–1.35)
GLUCOSE: 141 mg/dL — AB (ref 70–99)
HEMATOCRIT: 33 % — AB (ref 39.0–52.0)
Hemoglobin: 11.2 g/dL — ABNORMAL LOW (ref 13.0–17.0)
POTASSIUM: 4 meq/L (ref 3.7–5.3)
SODIUM: 137 meq/L (ref 137–147)
TCO2: 27 mmol/L (ref 0–100)

## 2013-04-21 LAB — CBC
HCT: 30.6 % — ABNORMAL LOW (ref 39.0–52.0)
HEMATOCRIT: 29.5 % — AB (ref 39.0–52.0)
HEMOGLOBIN: 9.8 g/dL — AB (ref 13.0–17.0)
Hemoglobin: 10.4 g/dL — ABNORMAL LOW (ref 13.0–17.0)
MCH: 28.1 pg (ref 26.0–34.0)
MCH: 28.9 pg (ref 26.0–34.0)
MCHC: 33.2 g/dL (ref 30.0–36.0)
MCHC: 34 g/dL (ref 30.0–36.0)
MCV: 84.5 fL (ref 78.0–100.0)
MCV: 85 fL (ref 78.0–100.0)
PLATELETS: 223 10*3/uL (ref 150–400)
Platelets: 202 10*3/uL (ref 150–400)
RBC: 3.49 MIL/uL — AB (ref 4.22–5.81)
RBC: 3.6 MIL/uL — ABNORMAL LOW (ref 4.22–5.81)
RDW: 16.1 % — ABNORMAL HIGH (ref 11.5–15.5)
RDW: 16.2 % — AB (ref 11.5–15.5)
WBC: 15.9 10*3/uL — AB (ref 4.0–10.5)
WBC: 15.8 10*3/uL — ABNORMAL HIGH (ref 4.0–10.5)

## 2013-04-21 LAB — BASIC METABOLIC PANEL
BUN: 11 mg/dL (ref 6–23)
CHLORIDE: 103 meq/L (ref 96–112)
CO2: 23 meq/L (ref 19–32)
CREATININE: 0.72 mg/dL (ref 0.50–1.35)
Calcium: 8.3 mg/dL — ABNORMAL LOW (ref 8.4–10.5)
GFR calc Af Amer: >90 mL/min (ref >90)
GFR calc non Af Amer: >90 mL/min (ref >90)
GLUCOSE: 123 mg/dL — AB (ref 70–99)
Potassium: 4.3 mEq/L (ref 3.7–5.3)
Sodium: 138 mEq/L (ref 137–147)

## 2013-04-21 LAB — CREATININE, SERUM
Creatinine, Ser: 0.81 mg/dL (ref 0.50–1.35)
GFR calc Af Amer: >90 mL/min (ref >90)
GFR calc non Af Amer: 87 mL/min — ABNORMAL LOW (ref >90)

## 2013-04-21 LAB — MAGNESIUM
Magnesium: 1.8 mg/dL (ref 1.5–2.5)
Magnesium: 1.9 mg/dL (ref 1.5–2.5)

## 2013-04-21 MED ORDER — TRAMADOL HCL 50 MG PO TABS
50.0000 mg | ORAL_TABLET | Freq: Four times a day (QID) | ORAL | Status: DC | PRN
  Administered 2013-04-21: 100 mg via ORAL
  Administered 2013-04-25: 50 mg via ORAL
  Administered 2013-04-25 – 2013-04-29 (×3): 100 mg via ORAL
  Administered 2013-05-04 (×3): 50 mg via ORAL
  Administered 2013-05-05: 100 mg via ORAL
  Filled 2013-04-21: qty 2
  Filled 2013-04-21: qty 1
  Filled 2013-04-21 (×3): qty 2
  Filled 2013-04-21 (×2): qty 1
  Filled 2013-04-21: qty 2
  Filled 2013-04-21: qty 1
  Filled 2013-04-21: qty 2

## 2013-04-21 MED ORDER — ZOLPIDEM TARTRATE 5 MG PO TABS
5.0000 mg | ORAL_TABLET | Freq: Every evening | ORAL | Status: DC | PRN
  Administered 2013-04-21 – 2013-04-23 (×3): 5 mg via ORAL
  Filled 2013-04-21 (×4): qty 1

## 2013-04-21 MED ORDER — MONTELUKAST SODIUM 10 MG PO TABS
10.0000 mg | ORAL_TABLET | Freq: Every day | ORAL | Status: DC
  Administered 2013-04-21 – 2013-05-04 (×14): 10 mg via ORAL
  Filled 2013-04-21 (×18): qty 1

## 2013-04-21 MED ORDER — SODIUM CHLORIDE 0.9 % IV SOLN
250.0000 mL | INTRAVENOUS | Status: DC
  Administered 2013-04-21: 250 mL via INTRAVENOUS
  Administered 2013-04-28: 1000 mL via INTRAVENOUS

## 2013-04-21 MED ORDER — CARVEDILOL 6.25 MG PO TABS
6.2500 mg | ORAL_TABLET | Freq: Two times a day (BID) | ORAL | Status: DC
  Administered 2013-04-21 – 2013-05-02 (×19): 6.25 mg via ORAL
  Filled 2013-04-21 (×26): qty 1

## 2013-04-21 MED ORDER — ENOXAPARIN SODIUM 40 MG/0.4ML ~~LOC~~ SOLN
40.0000 mg | Freq: Every day | SUBCUTANEOUS | Status: DC
  Administered 2013-04-21 – 2013-05-04 (×14): 40 mg via SUBCUTANEOUS
  Filled 2013-04-21 (×18): qty 0.4

## 2013-04-21 MED ORDER — INSULIN ASPART 100 UNIT/ML ~~LOC~~ SOLN
0.0000 [IU] | SUBCUTANEOUS | Status: DC
  Administered 2013-04-21 – 2013-04-23 (×3): 2 [IU] via SUBCUTANEOUS

## 2013-04-21 MED ORDER — DIAZEPAM 5 MG PO TABS
10.0000 mg | ORAL_TABLET | Freq: Two times a day (BID) | ORAL | Status: DC | PRN
  Administered 2013-04-21 – 2013-04-29 (×4): 10 mg via ORAL
  Filled 2013-04-21 (×4): qty 2

## 2013-04-21 MED FILL — Magnesium Sulfate Inj 50%: INTRAMUSCULAR | Qty: 10 | Status: AC

## 2013-04-21 MED FILL — Heparin Sodium (Porcine) Inj 1000 Unit/ML: INTRAMUSCULAR | Qty: 30 | Status: AC

## 2013-04-21 MED FILL — Sodium Chloride IV Soln 0.9%: INTRAVENOUS | Qty: 3000 | Status: AC

## 2013-04-21 MED FILL — Dexmedetomidine HCl IV Soln 200 MCG/2ML: INTRAVENOUS | Qty: 2 | Status: AC

## 2013-04-21 MED FILL — Potassium Chloride Inj 2 mEq/ML: INTRAVENOUS | Qty: 40 | Status: AC

## 2013-04-21 NOTE — Progress Notes (Signed)
Patient ID: Troy [name], male   DOB: March 04, 1941, 73 y.o.   MRN: [id number] EVENING ROUNDS NOTE :     [address]       [place] [address]             [phone]                 1 Day Post-Op Procedure(s) (LRB): OFF PUMP CORONARY ARTERY BYPASS GRAFTING (CABG) (N/A) INTRAOPERATIVE TRANSESOPHAGEAL ECHOCARDIOGRAM (N/A) LOBECTOMY  Total Length of Stay:  LOS: 1 day  BP 145/72  Pulse 115  Temp(Src) 99.8 F (37.7 C) (Oral)  Resp 23  Wt 171 lb 1.2 oz (77.6 kg)  SpO2 96%  .Intake/Output     01/29 0701 - 01/30 0700 01/30 0701 - 01/31 0700   P.O.  60   I.V. (mL/kg) 4766.7 (61.4) 190 (2.4)   Blood 159    NG/GT 30    IV Piggyback 1150 150   Total Intake(mL/kg) 6105.7 (78.7) 400 (5.2)   Urine (mL/kg/hr) 2595 (1.4) 290 (0.3)   Emesis/NG output 50 (0)    Blood 450 (0.2)    Chest Tube 420 (0.2) 405 (0.5)   Total Output 3515 695   Net +2590.7 -295          . sodium chloride Stopped (04/21/13 0800)  . sodium chloride    . sodium chloride    . dexmedetomidine Stopped (04/20/13 1815)  . insulin (NOVOLIN-R) infusion Stopped (04/20/13 1900)  . lactated ringers 20 mL/hr at 04/21/13 1500  . nitroGLYCERIN Stopped (04/20/13 2000)  . phenylephrine (NEO-SYNEPHRINE) Adult infusion Stopped (04/20/13 1430)     Lab Results  Component Value Date   WBC 15.8* 04/21/2013   HGB 10.4* 04/21/2013   HGB 11.2* 04/21/2013   HCT 30.6* 04/21/2013   HCT 33.0* 04/21/2013   PLT 223 04/21/2013   GLUCOSE 141* 04/21/2013   ALT 12 04/18/2013   AST 17 04/18/2013   NA 137 04/21/2013   K 4.0 04/21/2013   CL 99 04/21/2013   CREATININE 0.81 04/21/2013   CREATININE 0.90 04/21/2013   BUN 11 04/21/2013   CO2 23 04/21/2013   INR 1.16 04/20/2013   HGBA1C 6.2* 04/18/2013  up in chair Walked 200 feet small air leak from ct Foley still in   Grace Isaac MD  [place] Office [phone] 04/21/2013 6:24 PM

## 2013-04-21 NOTE — Op Note (Signed)
NAMECHOZEN, LATULIPPE NO.:  [id number]  MEDICAL RECORD NO.:  [id number]  LOCATION:  2S10C                        FACILITY:  [facility]  PHYSICIAN:  Revonda Standard. Roxan Hockey, M.D.DATE OF BIRTH:  [birth year]  DATE OF PROCEDURE:  04/20/2013 DATE OF DISCHARGE:                              OPERATIVE REPORT   PREOPERATIVE DIAGNOSES:  Severe single-vessel coronary disease, and right upper lobe mass.  POSTOPERATIVE DIAGNOSES:  Severe single-vessel coronary disease, squamous cell carcinoma right upper lobe.  PROCEDURE:  Median sternotomy, off pump coronary bypass grafting x1 (free right internal mammary artery to distal right coronary artery), right upper lobectomy.  SURGEON:  Revonda Standard. Roxan Hockey, M.D..  ASSISTANT:  John Giovanni, P.A.-C.  ANESTHESIA:  General.  FINDINGS:  2 cm mass in posterior aspect of right upper lobe, squamous cell carcinoma on frozen.  Margins clear.  Severe emphysematous changes in the right upper lobe.  Distal right coronary good quality target.  Right mammary artery good quality conduit, but unable to reach the target as a pedicle graft, therefore used as a free graft.  CLINICAL NOTE:  Troy [name] is a 73 year old gentleman who has a history of coronary disease and tobacco abuse.  He has been having exertional Angina. Workup revealed total occlusion of his right coronary that was not amenable to percutaneous intervention.  He also was found to have a 2.1 cm spiculated mass in the right upper lobe along with severe emphysematous changes in the right upper lobe.  It was felt that he would need coronary revascularization of his single-vessel coronary disease to have a lobectomy.  The patient strongly desired to have both procedures performed simultaneously.  The indications, risks, benefits, and alternatives, particularly the alternative of staged surgery were discussed in detail with the patient.  He understood and accepted the risks and agreed to  proceed.  DESCRIPTION OF PROCEDURE:  Mr. Anger was brought to the operating room on April 20, 2013.  A Swan-Ganz catheter and arterial blood pressure monitoring line had been placed in the preoperative holding area. Intravenous antibiotics were administered.  He was anesthetized and intubated with a double-lumen endotracheal tube.  A Foley catheter was placed.  The chest, abdomen, and legs were prepped and draped in usual sterile fashion.  A median sternotomy was performed.  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  The right internal mammary artery was harvested using standard technique.  2000 units of heparin was administered during the vessel harvest.  After completing the dissection, the mammary was divided at its distal end.  There was excellent flow.  A bulldog clamp was placed across the distal end.  The mammary was placed in a papaverine soaked sponge.  It should be noted that 2 small lymph nodes were encountered during the takedown of the mammary artery.  These were sent for permanent pathology.  They both appeared grossly normal.  Next, the right upper lobe was inspected.  It was diffusely emphysematous.  There was a palpable 2 cm mass posteriorly.  This was highly suspicious for cancer.  The pleural reflection was divided at the hilum anteriorly.  The superior pulmonary vein was dissected out. The middle lobe vein branch was  identified and preserved.  The upper lobe branches were encircled and divided with an endoscopic vascular stapler.  This was done with 2 separate firings of the stapler. Three level 10 lymph nodes were encountered during the dissection of the pulmonary vein and pulmonary artery.  These were all sent as separate specimens.  Next, the anterior branch of the pulmonary artery to the upper lobe was dissected out.  It was difficult to access the apical branch.  Therefore, the anterior branch was divided separately with a vascular stapler.  The apical branch was  divided subsequently with the stapler.  The posterior ascending branch was identified and likewise stapled and divided.  The minor fissure was completed with sequential firings of an endoscopic GIA stapler.  Finally, the bronchus was inspected.  Nodes along the bronchus were taken with the specimen.  A stapler was applied across the upper lobe bronchus at its origin and closed.  A test inflation showed good aeration of the lower and middle lobes.  The stapler was fired, dividing the bronchus and the specimen was removed and sent for frozen section.  Frozen section returned squamous cell carcinoma.  The bronchial margins were free of disease.  There was good hemostasis with all staple lines.  It should be noted the patient tolerated single lung ventilation of the left lung well throughout this portion of the procedure.  The lower and middle lobes, then were reinflated.  The middle lobe was tacked to the lower lobe with interrupted 3-0 Vicryl figure-of-eight sutures.  ProGel was applied to these sites.  There was some air leakage around the suture lines.  The remainder of the heparin dose calculated for off pump grafting was given.  The pericardium was opened.  It was clear at this point that the right mammary would not reach to the distal right coronary  as a pedicle graft.  Therefore, it was divided proximally and the proximal stump was suture ligated with a 2-0 silk suture. The distal end was beveled in preparation for the anastomosis.  The off pump retractor was placed.  A suction device was placed on the right ventricle near the acute margin and retracted superiorly exposing the distal right coronary and the distal right coronary was grafted beyond the palpable calcific plaque.  The stabilizer was placed at this site.  Tapes were placed proximally and distally.  An arteriotomy was made and lengthened proximally and distally.  The right coronary was a 2 mm good quality target at  the site.  Traction was applied to the distal silastic tape.  No traction was necessary on the proximal.  The distal end of the right mammary then was anastomosed end-to-side to the right coronary with a running 8-0 Prolene suture.  At completion of the anastomosis, tension was released off the distal silastic tape.  There was good back bleeding.  The suture was tied.  There was good backbleeding from the right mammary proximally.  The bulldog clamp was placed across the graft.  The mammary pedicle was tacked to the epicardial surface of the heart with 6-0 Prolene sutures.  Next, the proximal anastomosis was performed.  The proximal end of the right mammary graft was cut to length and beveled.  An arteriotomy was made and a 3.8 mm heartstring 3 device was deployed within the aorta. The right mammary to the aortic proximal anastomosis was performed with a running 7-0 Prolene suture.  At the completion of anastomosis, the heartstring device was removed and the suture was tied.  There was a small leak from the toe of the graft which was repaired with a 7-0 Prolene suture.  There was then good hemostasis.  There was a good Doppler signal in the graft.  The patient tolerated the off pump grafting without any significant hemodynamic compromise or ventricular arrhythmias.  He did have atrial fibrillation after the graft was completed.  He was cardioverted with 5 joules back into sinus rhythm, but was in sinus bradycardia.  Epicardial pacing wires were placed on the right ventricle and right atrium, and atrial pacing was initiated at 80 beats per minute.  A test dose of protamine was administered and was well tolerated.  The remainder of the protamine was administered slowly without any hemodynamic compromise.  Transesophageal echocardiography showed no change in the well-preserved left ventricular function after the graft was performed.  The chest was copiously irrigated with warm saline.   Hemostasis was achieved.  A 28-French chest tube was placed through a stab incision in the lateral right chest.  A #36-French right- angle chest tube was placed through a traditional port incision below the xiphoid and directed into the right pleural space and a 36-French straight chest tube was placed in the anterior mediastinum through a third stab type incision.  All chest tubes were secured with #1 silk sutures.  The pericardium was reapproximated with interrupted 3-0 silk sutures.  It came together easily without tension.  The chest was copiously irrigated with a warm saline.  Final inspection was made for hemostasis.  The sternum was closed with interrupted heavy gauge stainless steel wires.  The pectoralis fascia, subcutaneous tissue, and skin were closed in standard fashion.  All sponge, needle, and instrument counts were correct at the end of the procedure.  The patient was taken from the operating room to the Surgical Intensive Care Unit intubated and in good condition.     Revonda Standard Roxan Hockey, M.D.     SCH/MEDQ  D:  04/20/2013  T:  04/21/2013  Job:  [id number]

## 2013-04-21 NOTE — Progress Notes (Signed)
1 Day Post-Op Procedure(s) (LRB): OFF PUMP CORONARY ARTERY BYPASS GRAFTING (CABG) (N/A) INTRAOPERATIVE TRANSESOPHAGEAL ECHOCARDIOGRAM (N/A) LOBECTOMY Subjective: Some incisional pain Nausea/ dry heaves better  Objective: Vital signs in last 24 hours: Temp:  [91.8 F (33.2 C)-100.6 F (38.1 C)] 100.6 F (38.1 C) (01/30 0700) Pulse Rate:  [38-155] 94 (01/30 0700) Cardiac Rhythm:  [-] Atrial paced (01/30 0400) Resp:  [7-23] 17 (01/30 0700) BP: (81-154)/(57-108) 154/76 mmHg (01/30 0700) SpO2:  [81 %-100 %] 99 % (01/30 0700) Arterial Line BP: (89-148)/(58-107) 89/82 mmHg (01/30 0400) FiO2 (%):  [40 %-50 %] 40 % (01/29 1815) Weight:  [169 lb 1.5 oz (76.7 kg)-171 lb 1.2 oz (77.6 kg)] 171 lb 1.2 oz (77.6 kg) (01/30 0500)  Hemodynamic parameters for last 24 hours: PAP: (16-37)/(9-23) 37/21 mmHg CO:  [3.5 L/min-7.6 L/min] 7.6 L/min CI:  [1.8 L/min/m2-3.8 L/min/m2] 3.8 L/min/m2  Intake/Output from previous day: 01/29 0701 - 01/30 0700 In: 6105.7 [I.V.:4766.7; Blood:159; NG/GT:30; IV Piggyback:1150] Out: 1308 [Urine:2595; Emesis/NG output:50; Blood:450; Chest Tube:420] Intake/Output this shift:    General appearance: alert and no distress Neurologic: intact Heart: regular rate and rhythm Lungs: clear to auscultation bilaterally Abdomen: normal findings: soft, non-tender small air leak  Lab Results:  Recent Labs  04/20/13 2030 04/20/13 2044 04/21/13 0429  WBC 11.9*  --  15.9*  HGB 9.9* 11.2* 9.8*  HCT 29.1* 33.0* 29.5*  PLT 179  --  202   BMET:  Recent Labs  04/18/13 1247  04/20/13 2044 04/21/13 0429  NA 137  < > 136* 138  K 4.5  < > 4.1 4.3  CL 100  --  105 103  CO2 21  --   --  23  GLUCOSE 109*  < > 133* 123*  BUN 17  --  12 11  CREATININE 0.81  < > 0.60 0.72  CALCIUM 9.2  --   --  8.3*  < > = values in this interval not displayed.  PT/INR:  Recent Labs  04/20/13 1400  LABPROT 14.6  INR 1.16   ABG    Component Value Date/Time   PHART 7.384 04/20/2013  2014   HCO3 23.6 04/20/2013 2014   TCO2 22 04/20/2013 2044   ACIDBASEDEF 1.0 04/20/2013 2014   O2SAT 99.0 04/20/2013 2014   CBG (last 3)   Recent Labs  04/20/13 1714 04/20/13 2332 04/21/13 0338  GLUCAP 92 93 94    Assessment/Plan: S/P Procedure(s) (LRB): OFF PUMP CORONARY ARTERY BYPASS GRAFTING (CABG) (N/A) INTRAOPERATIVE TRANSESOPHAGEAL ECHOCARDIOGRAM (N/A) LOBECTOMY POD # 1 CABG/ RUL CV- good hemodynamics  Dc swan  RESP- s/p lobectomy, good gas exchange  Small air leak- will keep right pleural tube on water seal  RENAL- mildly volume overloaded  ENDO- CBG well controlled  Anemia- secondary to ABL- follow  SCD + enoxaparin for DVT prophylaxis  GI- reglan, advance diet slowly  OOB, ambulate   LOS: 1 day    Troy [name] C 04/21/2013

## 2013-04-22 ENCOUNTER — Inpatient Hospital Stay (HOSPITAL_COMMUNITY): Payer: Medicare Other

## 2013-04-22 LAB — BASIC METABOLIC PANEL
BUN: 13 mg/dL (ref 6–23)
CHLORIDE: 105 meq/L (ref 96–112)
CO2: 27 mEq/L (ref 19–32)
CREATININE: 0.85 mg/dL (ref 0.50–1.35)
Calcium: 9 mg/dL (ref 8.4–10.5)
GFR, EST NON AFRICAN AMERICAN: 86 mL/min — AB (ref >90)
Glucose, Bld: 111 mg/dL — ABNORMAL HIGH (ref 70–99)
Potassium: 4 mEq/L (ref 3.7–5.3)
Sodium: 143 mEq/L (ref 137–147)

## 2013-04-22 LAB — GLUCOSE, CAPILLARY
GLUCOSE-CAPILLARY: 78 mg/dL (ref 70–99)
GLUCOSE-CAPILLARY: 107 mg/dL — AB (ref 70–99)
GLUCOSE-CAPILLARY: 83 mg/dL (ref 70–99)
GLUCOSE-CAPILLARY: 98 mg/dL (ref 70–99)
Glucose-Capillary: 94 mg/dL (ref 70–99)

## 2013-04-22 LAB — CBC
HEMATOCRIT: 28.8 % — AB (ref 39.0–52.0)
Hemoglobin: 9.6 g/dL — ABNORMAL LOW (ref 13.0–17.0)
MCH: 28.5 pg (ref 26.0–34.0)
MCHC: 33.3 g/dL (ref 30.0–36.0)
MCV: 85.5 fL (ref 78.0–100.0)
Platelets: 202 10*3/uL (ref 150–400)
RBC: 3.37 MIL/uL — ABNORMAL LOW (ref 4.22–5.81)
RDW: 16.6 % — AB (ref 11.5–15.5)
WBC: 13.6 10*3/uL — AB (ref 4.0–10.5)

## 2013-04-22 MED ORDER — FUROSEMIDE 10 MG/ML IJ SOLN
20.0000 mg | Freq: Once | INTRAMUSCULAR | Status: AC
  Administered 2013-04-22: 20 mg via INTRAVENOUS
  Filled 2013-04-22: qty 2

## 2013-04-22 NOTE — Progress Notes (Signed)
Patient ID: [place] [name], male   DOB: [birth year], 73 y.o.   MRN: [id number] EVENING ROUNDS NOTE :     [address]       [name] [address]             [phone]                 2 Days Post-Op Procedure(s) (LRB): OFF PUMP CORONARY ARTERY BYPASS GRAFTING (CABG) (N/A) INTRAOPERATIVE TRANSESOPHAGEAL ECHOCARDIOGRAM (N/A) LOBECTOMY  Total Length of Stay:  LOS: 2 days  BP 142/82  Pulse 100  Temp(Src) 99.2 F (37.3 C) (Oral)  Resp 17  Ht 6' (1.829 m)  Wt 164 lb 10.9 oz (74.7 kg)  BMI 22.33 kg/m2  SpO2 97%  .Intake/Output     01/30 0701 - 01/31 0700 01/31 0701 - 02/01 0700   P.O. 60 250   I.V. (mL/kg) 472.7 (6.3) 160 (2.1)   Blood     NG/GT     IV Piggyback 150    Total Intake(mL/kg) 682.7 (9.1) 410 (5.5)   Urine (mL/kg/hr) 2400 (1.3) 1250 (1.4)   Emesis/NG output     Blood     Chest Tube 100 (0.1)    Total Output 2500 1250   Net -1817.3 -840          . sodium chloride Stopped (04/21/13 0800)  . sodium chloride Stopped (04/21/13 2045)  . sodium chloride 250 mL (04/22/13 0700)  . dexmedetomidine Stopped (04/20/13 1815)  . nitroGLYCERIN Stopped (04/20/13 2000)  . phenylephrine (NEO-SYNEPHRINE) Adult infusion Stopped (04/20/13 1430)     Lab Results  Component Value Date   WBC 13.6* 04/22/2013   HGB 9.6* 04/22/2013   HCT 28.8* 04/22/2013   PLT 202 04/22/2013   GLUCOSE 111* 04/22/2013   ALT 12 04/18/2013   AST 17 04/18/2013   NA 143 04/22/2013   K 4.0 04/22/2013   CL 105 04/22/2013   CREATININE 0.85 04/22/2013   BUN 13 04/22/2013   CO2 27 04/22/2013   INR 1.16 04/20/2013   HGBA1C 6.2* 04/18/2013   Stable day, has small air leak from ct  Grace Isaac MD  Beeper [phone] Office [phone] 04/22/2013 7:00 PM

## 2013-04-22 NOTE — Progress Notes (Signed)
Patient ID: [place] [name], male   DOB: 1940/09/03, 73 y.o.   MRN: [id number] TCTS DAILY ICU PROGRESS NOTE                   [address]            ,[place] [address]          [phone]   2 Days Post-Op Procedure(s) (LRB): OFF PUMP CORONARY ARTERY BYPASS GRAFTING (CABG) (N/A) INTRAOPERATIVE TRANSESOPHAGEAL ECHOCARDIOGRAM (N/A) LOBECTOMY  Total Length of Stay:  LOS: 2 days   Subjective: Up in chair walked last night  Objective: Vital signs in last 24 hours: Temp:  [98.3 F (36.8 C)-99.8 F (37.7 C)] 99 F (37.2 C) (01/31 0721) Pulse Rate:  [73-115] 106 (01/31 0900) Cardiac Rhythm:  [-] Normal sinus rhythm (01/31 0900) Resp:  [9-23] 14 (01/31 0900) BP: (91-163)/(49-90) 129/90 mmHg (01/31 0900) SpO2:  [94 %-100 %] 100 % (01/31 0900) Weight:  [164 lb 10.9 oz (74.7 kg)] 164 lb 10.9 oz (74.7 kg) (01/31 0500)  Filed Weights   04/20/13 1300 04/21/13 0500 04/22/13 0500  Weight: 169 lb 1.5 oz (76.7 kg) 171 lb 1.2 oz (77.6 kg) 164 lb 10.9 oz (74.7 kg)    Weight change: -4 lb 6.6 oz (-2 kg)   Hemodynamic parameters for last 24 hours:    Intake/Output from previous day: 01/30 0701 - 01/31 0700 In: 682.7 [P.O.:60; I.V.:472.7; IV Piggyback:150] Out: 2500 [Urine:2400; Chest Tube:100]  Intake/Output this shift: Total I/O In: 140 [P.O.:100; I.V.:40] Out: 150 [Urine:150]  Current Meds: Scheduled Meds: . acetaminophen  1,000 mg Oral Q6H   Or  . acetaminophen (TYLENOL) oral liquid 160 mg/5 mL  1,000 mg Per Tube Q6H  . aspirin EC  325 mg Oral Daily   Or  . aspirin  324 mg Per Tube Daily  . bisacodyl  10 mg Oral Daily   Or  . bisacodyl  10 mg Rectal Daily  . carvedilol  6.25 mg Oral BID WC  . docusate sodium  200 mg Oral Daily  . enoxaparin (LOVENOX) injection  40 mg Subcutaneous QHS  . insulin aspart  0-24 Units Subcutaneous Q4H  . metoCLOPramide (REGLAN) injection  10 mg Intravenous Q6H  . montelukast  10 mg Oral QHS  . pantoprazole  40 mg Oral Daily  .  sodium chloride  3 mL Intravenous Q12H   Continuous Infusions: . sodium chloride Stopped (04/21/13 0800)  . sodium chloride Stopped (04/21/13 2045)  . sodium chloride 250 mL (04/22/13 0700)  . dexmedetomidine Stopped (04/20/13 1815)  . nitroGLYCERIN Stopped (04/20/13 2000)  . phenylephrine (NEO-SYNEPHRINE) Adult infusion Stopped (04/20/13 1430)   PRN Meds:.diazepam, metoprolol, midazolam, morphine injection, ondansetron (ZOFRAN) IV, oxyCODONE, promethazine, sodium chloride, traMADol, zolpidem  General appearance: alert, cooperative and no distress Neurologic: intact Heart: regular rate and rhythm, S1, S2 normal, no murmur, click, rub or gallop Lungs: rhonchi RML and RUL Abdomen: soft, non-tender; bowel sounds normal; no masses,  no organomegaly Extremities: extremities normal, atraumatic, no cyanosis or edema and Homans sign is negative, no sign of DVT Wound: sternum stable  Lab Results: CBC: Recent Labs  04/21/13 1700 04/22/13 0400  WBC 15.8* 13.6*  HGB 10.4*  11.2* 9.6*  HCT 30.6*  33.0* 28.8*  PLT 223 202   BMET:  Recent Labs  04/21/13 0429 04/21/13 1700 04/22/13 0400  NA 138 137 143  K 4.3 4.0 4.0  CL 103 99 105  CO2 23  --  27  GLUCOSE  123* 141* 111*  BUN 11 11 13   CREATININE 0.72 0.81  0.90 0.85  CALCIUM 8.3*  --  9.0    PT/INR:  Recent Labs  04/20/13 1400  LABPROT 14.6  INR 1.16   Radiology: Dg Chest Port 1 View  04/22/2013   CLINICAL DATA:  Postop cardiac surgery  EXAM: PORTABLE CHEST - 1 VIEW  COMPARISON:  the previous day's study  FINDINGS: Interval removal of 1 of the 2 right chest tubes. A moderate right pneumothorax is now conspicuous, lung apex projecting at the posterior margin of the right fifth rib, with a moderate lateral compartment to the pneumothorax as well . There is some associated atelectasis versus early infiltrate at the right lung base. The Swan-Ganz has been retracted leaving the right IJ venous sheath in place. Changes of CABG.  Stable cardiomegaly. Tortuous atheromatous aorta. No effusion.  IMPRESSION: 1. New moderate right pneumothorax after removal of 1 of the 2 right chest tubes. Critical Value/emergent results were called by telephone at the time of interpretation at 8:12 AM to [facility] in the unit, who verbally acknowledged these results.   Electronically Signed   By: Arne Cleveland M.D.   On: 04/22/2013 08:13   Dg Chest Portable 1 View In Am  04/21/2013   CLINICAL DATA:  Coronary artery disease.  Status post CABG.  EXAM: PORTABLE CHEST - 1 VIEW  COMPARISON:  04/20/2013 and 04/18/2013  FINDINGS: Endotracheal tube and NG tube have been removed. Chest tubes and Swan-Ganz catheter remain. No pneumothorax. Lungs are clear. 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  IMPRESSION: No pneumothorax.  Lungs are clear.   Electronically Signed   By: Rozetta Nunnery M.D.   On: 04/21/2013 07:45   Dg Chest Portable 1 View  04/20/2013   CLINICAL DATA:  Status post cardiac surgery  EXAM: PORTABLE CHEST - 1 VIEW  COMPARISON:  04/18/2013  FINDINGS: Postsurgical changes are seen. An endotracheal tube is noted approximately 5 cm above the carina. A nasogastric catheter is noted within the stomach. A mediastinal drain and 2 right-sided chest tubes are seen. No pneumothorax is noted. A right-sided Swan-Ganz catheter is noted and somewhat coiled within the pulmonary outflow tract. The left lung is clear. No acute bony abnormality is noted.  IMPRESSION: Postsurgical change with tubes and lines as described. No acute abnormality is noted. A Swan-Ganz catheter somewhat coiled upon itself within the pulmonary outflow tract.   Electronically Signed   By: Inez Catalina M.D.   On: 04/20/2013 14:52     Assessment/Plan: S/P Procedure(s) (LRB): OFF PUMP CORONARY ARTERY BYPASS GRAFTING (CABG) (N/A) INTRAOPERATIVE TRANSESOPHAGEAL ECHOCARDIOGRAM (N/A) LOBECTOMY Mobilize Diuresis chest tube on rt back to suction D/c foley    Whitaker Holderman  B 04/22/2013 9:53 AM

## 2013-04-22 NOTE — Progress Notes (Signed)
Md called to make aware of claudication on all finger tips and the sole of the left foot. Pt reports finger tips feeling numb " like he has been out in the cold", but is having no pain in the finger tips, and no pain or numbness to the sole of the foot. Pt had just finished a walk 30 mins prior to the event and was sitting in the chair. O2 sats were 100% on RA, and pt was 140s/90s, pulse 2 pulses to both radials and dorsalis pedis. Finger tips do feel cool, but no cooler than the rest of the hands or arms. Feet are also cool but no cooler than the rest of his body.  Md wanted patient to return to bed. Will cont to monitor and assess patient.

## 2013-04-23 ENCOUNTER — Inpatient Hospital Stay (HOSPITAL_COMMUNITY): Payer: Medicare Other

## 2013-04-23 LAB — GLUCOSE, CAPILLARY
GLUCOSE-CAPILLARY: 101 mg/dL — AB (ref 70–99)
GLUCOSE-CAPILLARY: 126 mg/dL — AB (ref 70–99)
Glucose-Capillary: 82 mg/dL (ref 70–99)
Glucose-Capillary: 116 mg/dL — ABNORMAL HIGH (ref 70–99)
Glucose-Capillary: 102 mg/dL — ABNORMAL HIGH (ref 70–99)
Glucose-Capillary: 133 mg/dL — ABNORMAL HIGH (ref 70–99)
Glucose-Capillary: 94 mg/dL (ref 70–99)
Glucose-Capillary: 93 mg/dL (ref 70–99)

## 2013-04-23 LAB — CBC
HCT: 29.3 % — ABNORMAL LOW (ref 39.0–52.0)
Hemoglobin: 9.7 g/dL — ABNORMAL LOW (ref 13.0–17.0)
MCH: 28.4 pg (ref 26.0–34.0)
MCHC: 33.1 g/dL (ref 30.0–36.0)
MCV: 85.7 fL (ref 78.0–100.0)
Platelets: 215 10*3/uL (ref 150–400)
RBC: 3.42 MIL/uL — ABNORMAL LOW (ref 4.22–5.81)
RDW: 16.4 % — ABNORMAL HIGH (ref 11.5–15.5)
WBC: 12.9 10*3/uL — ABNORMAL HIGH (ref 4.0–10.5)

## 2013-04-23 LAB — BASIC METABOLIC PANEL
BUN: 14 mg/dL (ref 6–23)
CO2: 27 mEq/L (ref 19–32)
Calcium: 9 mg/dL (ref 8.4–10.5)
Chloride: 103 mEq/L (ref 96–112)
Creatinine, Ser: 0.74 mg/dL (ref 0.50–1.35)
GFR calc Af Amer: >90 mL/min (ref >90)
GFR calc non Af Amer: >90 mL/min (ref >90)
Glucose, Bld: 105 mg/dL — ABNORMAL HIGH (ref 70–99)
Potassium: 3.9 mEq/L (ref 3.7–5.3)
Sodium: 143 mEq/L (ref 137–147)

## 2013-04-23 NOTE — Progress Notes (Signed)
Patient ID: Troy [name], male   DOB: December 13, 1940, 73 y.o.   MRN: [id number] TCTS DAILY ICU PROGRESS NOTE                   [address]            [place] [address]          [phone]   3 Days Post-Op Procedure(s) (LRB): OFF PUMP CORONARY ARTERY BYPASS GRAFTING (CABG) (N/A) INTRAOPERATIVE TRANSESOPHAGEAL ECHOCARDIOGRAM (N/A) LOBECTOMY  Total Length of Stay:  LOS: 3 days   Subjective: Feels well, walked around unit this am, had episode of sudden blanching of finger that resolved  Objective: Vital signs in last 24 hours: Temp:  [97.8 F (36.6 C)-99.6 F (37.6 C)] 99.6 F (37.6 C) (02/01 0756) Pulse Rate:  [80-100] 95 (02/01 0712) Cardiac Rhythm:  [-] Normal sinus rhythm (02/01 0830) Resp:  [10-23] 17 (02/01 0712) BP: (105-150)/(68-92) 130/75 mmHg (02/01 0700) SpO2:  [96 %-100 %] 98 % (02/01 0712) Weight:  [160 lb 0.9 oz (72.6 kg)] 160 lb 0.9 oz (72.6 kg) (02/01 0600)  Filed Weights   04/21/13 0500 04/22/13 0500 04/23/13 0600  Weight: 171 lb 1.2 oz (77.6 kg) 164 lb 10.9 oz (74.7 kg) 160 lb 0.9 oz (72.6 kg)    Weight change: -4 lb 10.1 oz (-2.1 kg)   Hemodynamic parameters for last 24 hours:    Intake/Output from previous day: 01/31 0701 - 02/01 0700 In: 820 [P.O.:360; I.V.:460] Out: 1875 [Urine:1875]  Intake/Output this shift:    Current Meds: Scheduled Meds: . acetaminophen  1,000 mg Oral Q6H   Or  . acetaminophen (TYLENOL) oral liquid 160 mg/5 mL  1,000 mg Per Tube Q6H  . aspirin EC  325 mg Oral Daily   Or  . aspirin  324 mg Per Tube Daily  . bisacodyl  10 mg Oral Daily   Or  . bisacodyl  10 mg Rectal Daily  . carvedilol  6.25 mg Oral BID WC  . docusate sodium  200 mg Oral Daily  . enoxaparin (LOVENOX) injection  40 mg Subcutaneous QHS  . insulin aspart  0-24 Units Subcutaneous Q4H  . montelukast  10 mg Oral QHS  . pantoprazole  40 mg Oral Daily  . sodium chloride  3 mL Intravenous Q12H   Continuous Infusions: . sodium chloride  Stopped (04/21/13 0800)  . sodium chloride 250 mL (04/22/13 0700)   PRN Meds:.diazepam, metoprolol, morphine injection, ondansetron (ZOFRAN) IV, oxyCODONE, promethazine, sodium chloride, traMADol, zolpidem  General appearance: alert, cooperative and no distress Neurologic: intact Heart: regular rate and rhythm, S1, S2 normal, no murmur, click, rub or gallop Lungs: rhonchi RML and RUL Abdomen: soft, non-tender; bowel sounds normal; no masses,  no organomegaly Extremities: extremities normal, atraumatic, no cyanosis or edema and Homans sign is negative, no sign of DVT Wound: sternum stable  2+ air  Leak, back on suction with ptx yesterday Lab Results: CBC:  Recent Labs  04/22/13 0400 04/23/13 0414  WBC 13.6* 12.9*  HGB 9.6* 9.7*  HCT 28.8* 29.3*  PLT 202 215   BMET:   Recent Labs  04/22/13 0400 04/23/13 0414  NA 143 143  K 4.0 3.9  CL 105 103  CO2 27 27  GLUCOSE 111* 105*  BUN 13 14  CREATININE 0.85 0.74  CALCIUM 9.0 9.0    PT/INR:   Recent Labs  04/20/13 1400  LABPROT 14.6  INR 1.16   Radiology: Dg Chest [facility] [address]  04/23/2013   CLINICAL DATA:  Postop.  Evaluate chest tubes.  EXAM: PORTABLE CHEST - 1 VIEW  COMPARISON:  04/22/2013  FINDINGS: Right pneumothorax has significantly decreased in size, now only a small apical pneumothorax.  Right chest tube is stable. Right internal jugular introducer sheath is stable.  No left pneumothorax. No evidence of pulmonary edema or lung infiltrates. No mediastinal widening.  IMPRESSION: 1. Improved right pneumothorax now with only a small right apical pneumothorax 2. No other change.   Electronically Signed   By: Lajean Manes M.D.   On: 04/23/2013 08:07   Dg Chest Port 1 View  04/22/2013   CLINICAL DATA:  Postop cardiac surgery  EXAM: PORTABLE CHEST - 1 VIEW  COMPARISON:  the previous day's study  FINDINGS: Interval removal of 1 of the 2 right chest tubes. A moderate right pneumothorax is now conspicuous, lung apex projecting at  the posterior margin of the right fifth rib, with a moderate lateral compartment to the pneumothorax as well . There is some associated atelectasis versus early infiltrate at the right lung base. The Swan-Ganz has been retracted leaving the right IJ venous sheath in place. Changes of CABG. Stable cardiomegaly. Tortuous atheromatous aorta. No effusion.  IMPRESSION: 1. New moderate right pneumothorax after removal of 1 of the 2 right chest tubes. Critical Value/emergent results were called by telephone at the time of interpretation at 8:12 AM to [facility] in the unit, who verbally acknowledged these results.   Electronically Signed   By: Arne Cleveland M.D.   On: 04/22/2013 08:13     Assessment/Plan: S/P Procedure(s) (LRB): OFF PUMP CORONARY ARTERY BYPASS GRAFTING (CABG) (N/A) INTRAOPERATIVE TRANSESOPHAGEAL ECHOCARDIOGRAM (N/A) LOBECTOMY Mobilize Diuresis chest tube  back to suction chest xray better D/c rt ij  Raia Amico B 04/23/2013 10:52 AM

## 2013-04-23 NOTE — Progress Notes (Signed)
Patient ID: Troy [name], male   DOB: November 11, 1940, 73 y.o.   MRN: [id number] EVENING ROUNDS NOTE :     [address]       [name] [address]             [phone]                 3 Days Post-Op Procedure(s) (LRB): OFF PUMP CORONARY ARTERY BYPASS GRAFTING (CABG) (N/A) INTRAOPERATIVE TRANSESOPHAGEAL ECHOCARDIOGRAM (N/A) LOBECTOMY  Total Length of Stay:  LOS: 3 days  BP 129/79  Pulse 86  Temp(Src) 99.1 F (37.3 C) (Oral)  Resp 19  Ht 6' (1.829 m)  Wt 160 lb 0.9 oz (72.6 kg)  BMI 21.70 kg/m2  SpO2 97%  .Intake/Output     02/01 0701 - 02/02 0700   P.O. 250   I.V. (mL/kg)    Total Intake(mL/kg) 250 (3.4)   Urine (mL/kg/hr) 825 (0.9)   Total Output 825   Net -575         . sodium chloride Stopped (04/21/13 0800)  . sodium chloride 250 mL (04/22/13 0700)     Lab Results  Component Value Date   WBC 12.9* 04/23/2013   HGB 9.7* 04/23/2013   HCT 29.3* 04/23/2013   PLT 215 04/23/2013   GLUCOSE 105* 04/23/2013   ALT 12 04/18/2013   AST 17 04/18/2013   NA 143 04/23/2013   K 3.9 04/23/2013   CL 103 04/23/2013   CREATININE 0.74 04/23/2013   BUN 14 04/23/2013   CO2 27 04/23/2013   INR 1.16 04/20/2013   HGBA1C 6.2* 04/18/2013   Stable day, ambulated without trouble Still with air leak  Grace Isaac MD  Beeper [phone] Office [phone] 04/23/2013 7:21 PM

## 2013-04-24 ENCOUNTER — Inpatient Hospital Stay (HOSPITAL_COMMUNITY): Payer: Medicare Other

## 2013-04-24 LAB — BASIC METABOLIC PANEL
BUN: 17 mg/dL (ref 6–23)
CO2: 25 mEq/L (ref 19–32)
Calcium: 9.2 mg/dL (ref 8.4–10.5)
Chloride: 101 mEq/L (ref 96–112)
Creatinine, Ser: 0.8 mg/dL (ref 0.50–1.35)
GFR calc Af Amer: >90 mL/min (ref >90)
GFR calc non Af Amer: 88 mL/min — ABNORMAL LOW (ref >90)
Glucose, Bld: 155 mg/dL — ABNORMAL HIGH (ref 70–99)
Potassium: 4.4 mEq/L (ref 3.7–5.3)
Sodium: 141 mEq/L (ref 137–147)

## 2013-04-24 LAB — GLUCOSE, CAPILLARY
Glucose-Capillary: 108 mg/dL — ABNORMAL HIGH (ref 70–99)
Glucose-Capillary: 107 mg/dL — ABNORMAL HIGH (ref 70–99)
Glucose-Capillary: 114 mg/dL — ABNORMAL HIGH (ref 70–99)

## 2013-04-24 LAB — CBC
HCT: 31.3 % — ABNORMAL LOW (ref 39.0–52.0)
Hemoglobin: 10.5 g/dL — ABNORMAL LOW (ref 13.0–17.0)
MCH: 28.7 pg (ref 26.0–34.0)
MCHC: 33.5 g/dL (ref 30.0–36.0)
MCV: 85.5 fL (ref 78.0–100.0)
Platelets: 294 10*3/uL (ref 150–400)
RBC: 3.66 MIL/uL — ABNORMAL LOW (ref 4.22–5.81)
RDW: 16.5 % — ABNORMAL HIGH (ref 11.5–15.5)
WBC: 9.5 10*3/uL (ref 4.0–10.5)

## 2013-04-24 MED ORDER — ENSURE COMPLETE PO LIQD
237.0000 mL | Freq: Three times a day (TID) | ORAL | Status: DC
  Administered 2013-04-24 – 2013-04-30 (×12): 237 mL via ORAL

## 2013-04-24 MED ORDER — ALBUTEROL SULFATE (2.5 MG/3ML) 0.083% IN NEBU: 2.5000 mg | INHALATION_SOLUTION | Freq: Four times a day (QID) | RESPIRATORY_TRACT | Status: DC | PRN

## 2013-04-24 NOTE — Plan of Care (Signed)
Problem: Phase II - Intermediate Post-Op Goal: Advance Diet Outcome: Progressing Pt still on full liquid per his own preference

## 2013-04-24 NOTE — Progress Notes (Signed)
INITIAL NUTRITION ASSESSMENT  DOCUMENTATION CODES Per approved criteria  -Severe malnutrition in the context of chronic illness  Pt meets criteria for severe MALNUTRITION in the context of chronic illness as evidenced by wt loss of 16.4% in 3.5 months, and severe fat and muscle wasting.  INTERVENTION: Ensure Complete po TID, each supplement provides 350 kcal and 13 grams of protein Pt encouraged to try [employer]entials at least BID at home.  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   Goal: Pt to meet >/= 90% of their estimated nutrition needs   Monitor:  Wt, po intake, acceptance of supplements  Reason for Assessment: MST  73 y.o. male  Admitting Dx: <principal problem not specified>  ASSESSMENT: Marven Derks presented 1/29 for surgery (CABG x 1 and lobectomy), with the diagnosis of CAD.  Pt reports that his usual body weight is 189 lbs (3 1/2 mos ago) and that he knows he has lost weight. He says that his appetite has been poor. Pt had eaten ~50% of his breakfast this am which RD observed during visit.   Nutrition Focused Physical Exam:  Subcutaneous Fat:  Orbital Region: severe wasting Upper Arm Region: severe wasting Thoracic and Lumbar Region: n/a  Muscle:  Temple Region: severe wasting Clavicle Bone Region: severe wasting Clavicle and Acromion Bone Region: severe wasting Scapular Bone Region: n/a Dorsal Hand: severe wasting Patellar Region: severe wasting Anterior Thigh Region: n/a Posterior Calf Region: moderate wasting  Edema: none    Height: Ht Readings from Last 1 Encounters:  04/22/13 6' (1.829 m)    Weight: Wt Readings from Last 1 Encounters:  04/24/13 158 lb 8.2 oz (71.9 kg)    Ideal Body Weight: 77.6 kg  % Ideal Body Weight: 93%  Wt Readings from Last 10 Encounters:  04/24/13 158 lb 8.2 oz (71.9 kg)  04/24/13 158 lb 8.2 oz  (71.9 kg)  04/19/13 169 lb (76.658 kg)  04/18/13 169 lb 8 oz (76.885 kg)  04/05/13 167 lb (75.751 kg)  03/28/13 166 lb (75.297 kg)  03/20/13 169 lb (76.658 kg)  03/20/13 167 lb (75.751 kg)  03/03/13 172 lb (78.019 kg)  03/01/13 173 lb (78.472 kg)    Usual Body Weight: 189 lbs, 3 1/2 months ago  % Usual Body Weight: 84%  BMI:  Body mass index is 21.49 kg/(m^2).  Estimated Nutritional Needs: Kcal: 1900-2100 Protein: 85-100 g Fluid: 1.9-2.1 L  Skin: incision on chest  Diet Order: Full Liquid  EDUCATION NEEDS: -Education needs addressed   Intake/Output Summary (Last 24 hours) at 04/24/13 1021 Last data filed at 04/24/13 0700  Gross per 24 hour  Intake    320 ml  Output   1075 ml  Net   -755 ml    Last BM: none recorded   Labs:   Recent Labs Lab 04/20/13 2030  04/21/13 0429 04/21/13 1700 04/22/13 0400 04/23/13 0414 04/24/13 0715  NA  --   < > 138 137 143 143 141  K  --   < > 4.3 4.0 4.0 3.9 4.4  CL  --   < > 103 99 105 103 101  CO2  --   --  23  --  27 27 25   BUN  --   < > 11 11 13 14 17   CREATININE 0.62  < > 0.72 0.81  0.90 0.85 0.74 0.80  CALCIUM  --   --  8.3*  --  9.0 9.0 9.2  MG 2.3  --  1.8 1.9  --   --   --   GLUCOSE  --   < > 123* 141* 111* 105* 155*  < > = values in this interval not displayed.  CBG (last 3)   Recent Labs  04/23/13 2028 04/23/13 2327 04/24/13 0502  GLUCAP 93 108* 107*    Scheduled Meds: . acetaminophen  1,000 mg Oral Q6H   Or  . acetaminophen (TYLENOL) oral liquid 160 mg/5 mL  1,000 mg Per Tube Q6H  . aspirin EC  325 mg Oral Daily   Or  . aspirin  324 mg Per Tube Daily  . bisacodyl  10 mg Oral Daily   Or  . bisacodyl  10 mg Rectal Daily  . carvedilol  6.25 mg Oral BID WC  . docusate sodium  200 mg Oral Daily  . enoxaparin (LOVENOX) injection  40 mg Subcutaneous QHS  . montelukast  10 mg Oral QHS  . pantoprazole  40 mg Oral Daily  . sodium chloride  3 mL Intravenous Q12H    Continuous Infusions: . sodium  chloride Stopped (04/21/13 0800)  . sodium chloride 250 mL (04/22/13 0700)    Past Medical History  Diagnosis Date  . CAD (coronary artery disease)   . HTN (hypertension)   . COPD (chronic obstructive pulmonary disease)   . Hyperlipidemia   . Depression   . Vitamin D deficiency   . Sleep apnea   . Anginal pain   . Shortness of breath   . Arthritis   . Polycystic kidney disease     Past Surgical History  Procedure Laterality Date  . Cholecystectomy    . Hernia repair      x 11  . Angioplasty    . Cardiac catheterization      multiple  . Carpal tunnel release Right   . Spinal cord stimulator implant    . Back surgery    . Vagotomy    . Appendectomy    . Gastrectomy    . Nissen fundoplication    . Coronary artery bypass graft N/A 04/20/2013    Procedure: OFF PUMP CORONARY ARTERY BYPASS GRAFTING (CABG);  Surgeon: Melrose Nakayama, MD;  Location: [facility] [place];  Service: Open Heart Surgery;  Laterality: N/A;  CABG X 1  POSSIBLE OFF PUMP  . Intraoperative transesophageal echocardiogram N/A 04/20/2013    Procedure: INTRAOPERATIVE TRANSESOPHAGEAL ECHOCARDIOGRAM;  Surgeon: Melrose Nakayama, MD;  Location: [place];  Service: Open Heart Surgery;  Laterality: N/A;  . Lobectomy  04/20/2013    Procedure: LOBECTOMY;  Surgeon: Melrose Nakayama, MD;  Location: [place];  Service: Open Heart Surgery;;  Right Upper Lobectomy    Terrace Arabia RD, LDN

## 2013-04-24 NOTE — Progress Notes (Signed)
4 Days Post-Op Procedure(s) (LRB): OFF PUMP CORONARY ARTERY BYPASS GRAFTING (CABG) (N/A) INTRAOPERATIVE TRANSESOPHAGEAL ECHOCARDIOGRAM (N/A) LOBECTOMY Subjective: Some pain from CT  Objective: Vital signs in last 24 hours: Temp:  [98.4 F (36.9 C)-99.1 F (37.3 C)] 98.4 F (36.9 C) (02/02 0500) Pulse Rate:  [74-102] 88 (02/02 0730) Cardiac Rhythm:  [-] Normal sinus rhythm (02/02 0730) Resp:  [9-19] 13 (02/02 0730) BP: (93-149)/(51-90) 119/66 mmHg (02/02 0600) SpO2:  [94 %-100 %] 100 % (02/02 0730) Weight:  [158 lb 8.2 oz (71.9 kg)] 158 lb 8.2 oz (71.9 kg) (02/02 0500)  Hemodynamic parameters for last 24 hours:    Intake/Output from previous day: 02/01 0701 - 02/02 0700 In: 420 [P.O.:420] Out: 1075 [Urine:1075] Intake/Output this shift:    General appearance: alert and no distress Neurologic: intact Heart: regular rate and rhythm Lungs: diminished breath sounds bilaterally + air leak  Lab Results:  Recent Labs  04/23/13 0414 04/24/13 0715  WBC 12.9* 9.5  HGB 9.7* 10.5*  HCT 29.3* 31.3*  PLT 215 294   BMET:  Recent Labs  04/22/13 0400 04/23/13 0414  NA 143 143  K 4.0 3.9  CL 105 103  CO2 27 27  GLUCOSE 111* 105*  BUN 13 14  CREATININE 0.85 0.74  CALCIUM 9.0 9.0    PT/INR: No results found for this basename: LABPROT, INR,  in the last 72 hours ABG    Component Value Date/Time   PHART 7.384 04/20/2013 2014   HCO3 23.6 04/20/2013 2014   TCO2 27 04/21/2013 1700   ACIDBASEDEF 1.0 04/20/2013 2014   O2SAT 99.0 04/20/2013 2014   CBG (last 3)   Recent Labs  04/23/13 2028 04/23/13 2327 04/24/13 0502  GLUCAP 93 108* 107*    Assessment/Plan: S/P Procedure(s) (LRB): OFF PUMP CORONARY ARTERY BYPASS GRAFTING (CABG) (N/A) INTRAOPERATIVE TRANSESOPHAGEAL ECHOCARDIOGRAM (N/A) LOBECTOMY POD #4  CV- stable  RESP- still has an air leak- has been on suction- will change back to water seal   Pulmonary hygiene  RENAL_ below preop weight   CBG well  controlled- dc CBG  Transfer 3300 or 2000 depending on bed availability   LOS: 4 days    Troy [name] C 04/24/2013

## 2013-04-25 ENCOUNTER — Inpatient Hospital Stay (HOSPITAL_COMMUNITY): Payer: Medicare Other

## 2013-04-25 DIAGNOSIS — E43 Unspecified severe protein-calorie malnutrition: Secondary | ICD-10-CM | POA: Diagnosis present

## 2013-04-25 MED ORDER — KETOROLAC TROMETHAMINE 15 MG/ML IJ SOLN
15.0000 mg | Freq: Four times a day (QID) | INTRAMUSCULAR | Status: AC | PRN
  Administered 2013-04-26: 15 mg via INTRAVENOUS
  Filled 2013-04-25 (×2): qty 1

## 2013-04-25 NOTE — Progress Notes (Signed)
Pacer wires removed per order and protocal, wires intact upon removal, pt tolerated well, pt made aware for bedrest for 1 hr, pt stated understanding, will continue to monitor Rickard Rhymes, RN

## 2013-04-25 NOTE — Progress Notes (Signed)
CARDIAC REHAB PHASE I   PRE:  Rate/Rhythm: 89 SR    BP: sitting 82/40    SaO2: 96 RA  MODE:  Ambulation: 350 ft   POST:  Rate/Rhythm: 100 ST    BP: sitting 96/70     SaO2: 95 RA ear  Tolerated well. Sts he feels more tired today than yesterday but still doing well. BP low but asymptomatic. Denied SOB. To bed for nap. Will continue to follow. [phone]   Troy [name] [place] CES, ACSM 04/25/2013 3:15 PM

## 2013-04-25 NOTE — Progress Notes (Addendum)
      [address]       [place] [address]             [phone]      5 Days Post-Op Procedure(s) (LRB): OFF PUMP CORONARY ARTERY BYPASS GRAFTING (CABG) (N/A) INTRAOPERATIVE TRANSESOPHAGEAL ECHOCARDIOGRAM (N/A) LOBECTOMY  Subjective:  Mr. Zagal continues to complain of pain.  He states he had a lot of pain overnight, that was not easily controlled.  He states medication did not provide much relief.  + BM  Objective: Vital signs in last 24 hours: Temp:  [97.2 F (36.2 C)-99.5 F (37.5 C)] 98.6 F (37 C) (02/03 0620) Pulse Rate:  [86-104] 87 (02/03 0620) Cardiac Rhythm:  [-] Normal sinus rhythm (02/03 0754) Resp:  [14-18] 18 (02/03 0620) BP: (111-158)/(74-109) 117/74 mmHg (02/03 0620) SpO2:  [92 %-100 %] 96 % (02/03 0620) Weight:  [157 lb 13.6 oz (71.6 kg)] 157 lb 13.6 oz (71.6 kg) (02/03 0500)  Intake/Output from previous day: 02/02 0701 - 02/03 0700 In: 1196.7 [P.O.:480; I.V.:716.7] Out: 600 [Urine:560; Chest Tube:40]  General appearance: alert, cooperative and no distress Heart: regular rate and rhythm Lungs: clear to auscultation bilaterally Abdomen: soft, non-tender; bowel sounds normal; no masses,  no organomegaly Extremities: edema trace Wound: clean and dry  Lab Results:  Recent Labs  04/23/13 0414 04/24/13 0715  WBC 12.9* 9.5  HGB 9.7* 10.5*  HCT 29.3* 31.3*  PLT 215 294   BMET:  Recent Labs  04/23/13 0414 04/24/13 0715  NA 143 141  K 3.9 4.4  CL 103 101  CO2 27 25  GLUCOSE 105* 155*  BUN 14 17  CREATININE 0.74 0.80  CALCIUM 9.0 9.2    PT/INR: No results found for this basename: LABPROT, INR,  in the last 72 hours ABG    Component Value Date/Time   PHART 7.384 04/20/2013 2014   HCO3 23.6 04/20/2013 2014   TCO2 27 04/21/2013 1700   ACIDBASEDEF 1.0 04/20/2013 2014   O2SAT 99.0 04/20/2013 2014   CBG (last 3)   Recent Labs  04/23/13 2327 04/24/13 0502 04/24/13 0829  GLUCAP 108* 107* 114*    Assessment/Plan: S/P  Procedure(s) (LRB): 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 CV- NSR good rate and pressure control- on Coreg, will d/c EPW 2. Pulm- chest tube, + air leak on water seal, CXR remains stable, will leave chest tube in place 3. Renal- weight is below baseline, no diuresis at this time 4. Dispo- patient stable, chest tube with air leak on water seal, leave in place for now, d/c pacing wires   LOS: 5 days    BARRETT, ERIN 04/25/2013  Still has an air leak- lung remaining up on water seal- leak decreased from yesterday

## 2013-04-26 ENCOUNTER — Inpatient Hospital Stay (HOSPITAL_COMMUNITY): Payer: Medicare Other

## 2013-04-26 LAB — URINALYSIS, ROUTINE W REFLEX MICROSCOPIC
Glucose, UA: NEGATIVE mg/dL
Hgb urine dipstick: NEGATIVE
Ketones, ur: 15 mg/dL — AB
NITRITE: POSITIVE — AB
Protein, ur: 30 mg/dL — AB
Specific Gravity, Urine: 1.034 — ABNORMAL HIGH (ref 1.005–1.030)
Urobilinogen, UA: 2 mg/dL — ABNORMAL HIGH (ref 0.0–1.0)
pH: 5.5 (ref 5.0–8.0)

## 2013-04-26 LAB — URINE MICROSCOPIC-ADD ON

## 2013-04-26 MED ORDER — ACETAMINOPHEN 325 MG PO TABS
650.0000 mg | ORAL_TABLET | Freq: Four times a day (QID) | ORAL | Status: DC | PRN
  Administered 2013-04-26 – 2013-05-05 (×7): 650 mg via ORAL
  Filled 2013-04-26 (×7): qty 2

## 2013-04-26 MED ORDER — VANCOMYCIN HCL IN DEXTROSE 750-5 MG/150ML-% IV SOLN
750.0000 mg | Freq: Three times a day (TID) | INTRAVENOUS | Status: DC
  Administered 2013-04-26 – 2013-04-28 (×6): 750 mg via INTRAVENOUS
  Filled 2013-04-26 (×7): qty 150

## 2013-04-26 MED ORDER — LEVOFLOXACIN IN D5W 500 MG/100ML IV SOLN
500.0000 mg | INTRAVENOUS | Status: DC
  Administered 2013-04-26: 500 mg via INTRAVENOUS
  Filled 2013-04-26 (×2): qty 100

## 2013-04-26 NOTE — Progress Notes (Signed)
ANTIBIOTIC CONSULT NOTE - INITIAL  Pharmacy Consult for vancomycin Indication: pneumonia  Allergies  Allergen Reactions  . Augmentin [Amoxicillin-Pot Clavulanate] Other (See Comments)    "passed out" when taking augmentin and flagyl together  . Flagyl [Metronidazole] Other (See Comments)    "passed out" when taking augmentin and flagyl together  . Tape Other (See Comments)    Blisters (need to use paper tape)    Patient Measurements: Height: 6' (182.9 cm) Weight: 158 lb 1.1 oz (71.7 kg) (b) IBW/kg (Calculated) : 77.6   Vital Signs: Temp: 102.4 F (39.1 C) (02/04 1342) Temp src: Oral (02/04 1342) BP: 94/57 mmHg (02/04 1342) Pulse Rate: 107 (02/04 1342) Intake/Output from previous day: 02/03 0701 - 02/04 0700 In: 240 [P.O.:240] Out: 2 [Urine:1; Stool:1] Intake/Output from this shift: Total I/O In: -  Out: 2 [Urine:1; Stool:1]  Labs:  Recent Labs  04/24/13 0715  WBC 9.5  HGB 10.5*  PLT 294  CREATININE 0.80   Estimated Creatinine Clearance: 85.9 ml/min (by C-G formula based on Cr of 0.8). No results found for this basename: VANCOTROUGH, Corlis Leak, VANCORANDOM, GENTTROUGH, GENTPEAK, GENTRANDOM, TOBRATROUGH, TOBRAPEAK, TOBRARND, AMIKACINPEAK, AMIKACINTROU, AMIKACIN,  in the last 72 hours   Microbiology: Recent Results (from the past 720 hour(s))  SURGICAL PCR SCREEN     Status: None   Collection Time    04/18/13 12:46 PM      Result Value Range Status   MRSA, PCR NEGATIVE  NEGATIVE Final   Staphylococcus aureus NEGATIVE  NEGATIVE Final   Comment:            The Xpert SA Assay (FDA     approved for NASAL specimens     in patients over 70 years of age),     is one component of     a comprehensive surveillance     program.  Test performance has     been validated by [employer] for patients greater     than or equal to 65 year old.     It is not intended     to diagnose infection nor to     guide or monitor treatment.    Medical History: Past  Medical History  Diagnosis Date  . CAD (coronary artery disease)   . HTN (hypertension)   . COPD (chronic obstructive pulmonary disease)   . Hyperlipidemia   . Depression   . Vitamin D deficiency   . Sleep apnea   . Anginal pain   . Shortness of breath   . Arthritis   . Polycystic kidney disease     Assessment: Patient is a 73 y.o M s/p OHS on 1/29 and now febrile with noted expansion of pneumothorax.  To start levaquin and vancomycin for suspected PNA.  Goal of Therapy:  Vancomycin trough level 15-20 mcg/ml  Plan:  1) vancomycin 750mg  IV q8h 2) levaquin 500mg  IV q24h per MD  Camiah Humm P 04/26/2013,3:35 PM

## 2013-04-26 NOTE — Progress Notes (Addendum)
      [address]       [place] [address]             [phone]      6 Days Post-Op Procedure(s) (LRB): OFF PUMP CORONARY ARTERY BYPASS GRAFTING (CABG) (N/A) INTRAOPERATIVE TRANSESOPHAGEAL ECHOCARDIOGRAM (N/A) LOBECTOMY  Subjective:  Troy [name] continues to complain of pain.  Troy [name] states yesterday evening and overnight it was consistently an 8/10.  However, Toradol was ordered but not utilized by nursing.  I spoke with nurse this morning, who will administer Toradol to see if we can improve pain.  Troy [name] is febrile this morning, denies colored sputum and urinary symptoms  Objective: Vital signs in last 24 hours: Temp:  [97.9 F (36.6 C)-100.4 F (38 C)] 100.4 F (38 C) (02/04 0537) Pulse Rate:  [78-99] 99 (02/04 0537) Cardiac Rhythm:  [-] Junctional rhythm (02/04 0755) Resp:  [16-18] 18 (02/04 0537) BP: (82-133)/(48-74) 133/74 mmHg (02/04 0537) SpO2:  [95 %-98 %] 95 % (02/04 0537) Weight:  [158 lb 1.1 oz (71.7 kg)] 158 lb 1.1 oz (71.7 kg) (02/04 0537)  Intake/Output from previous day: 02/03 0701 - 02/04 0700 In: 240 [P.O.:240] Out: 2 [Urine:1; Stool:1]  General appearance: alert, cooperative and no distress Heart: regular rate and rhythm Lungs: clear to auscultation bilaterally Abdomen: soft, non-tender; bowel sounds normal; no masses,  no organomegaly Extremities: extremities normal, atraumatic, no cyanosis or edema Wound: clean and dry  Lab Results:  Recent Labs  04/24/13 0715  WBC 9.5  HGB 10.5*  HCT 31.3*  PLT 294   BMET:  Recent Labs  04/24/13 0715  NA 141  K 4.4  CL 101  CO2 25  GLUCOSE 155*  BUN 17  CREATININE 0.80  CALCIUM 9.2    PT/INR: No results found for this basename: LABPROT, INR,  in the last 72 hours ABG    Component Value Date/Time   PHART 7.384 04/20/2013 2014   HCO3 23.6 04/20/2013 2014   TCO2 27 04/21/2013 1700   ACIDBASEDEF 1.0 04/20/2013 2014   O2SAT 99.0 04/20/2013 2014   CBG (last 3)   Recent Labs  04/23/13 2327 04/24/13 0502 04/24/13 0829  GLUCAP 108* 107* 114*    Assessment/Plan: S/P Procedure(s) (LRB): 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 CV- NSR rate tachy this morning, good pressure control continue Coreg 2. Pulm- CXR with increase in size of Pneumothorax, remains small in size, continued air leak, leave chest tube in place 3. ID- Febrile this morning, no obvious source of infection, will repeat CBC, BMET, UA 4. Pain control- has Oxy, Morphine, and Toradol has been ordered 5. Dispo- Troy [name] with increase in pneumothorax, increase in pain level overnight-will try Toradol, continue current care   LOS: 6 days    BARRETT, ERIN 04/26/2013 Troy [name] seen and examined, agree with above. Troy [name] still has an air leak, definitely less than it was 2-3 days ago. I think it will stop without any reintervention Troy [name] has an apical space but still has good expansion of lower and middle lobes- leave on water seal

## 2013-04-26 NOTE — Progress Notes (Signed)
CARDIAC REHAB PHASE I   PRE:  Rate/Rhythm: 110ST  BP:  Supine:   Sitting: 110/66  Standing:    SaO2: 94%RA  MODE:  Ambulation: 150 ft   POST:  Rate/Rhythm: 130 ST  BP:  Supine:   Sitting: 125/66  Standing:    SaO2: would not register on fingers or ear [id number] Pt not feeling as well today. Nauseated. Walked 150 ft on RA with rolling walker. Heart rate elevated. Pt did not feel up to going farther.  To recliner after walk. Brother in room. Asst x 1. Encouraged to go farther this afternoon with nurses if feeling better.   Graylon Good, RN BSN  04/26/2013 11:50 AM

## 2013-04-26 NOTE — Progress Notes (Signed)
PA notified of temp 102.4, orders received for tylenol, blood cultures, and abx, will continue to monitor patient, awaiting UA specimen Jerline Pain, Anselmo Pickler, RN

## 2013-04-27 ENCOUNTER — Inpatient Hospital Stay (HOSPITAL_COMMUNITY): Payer: Medicare Other

## 2013-04-27 LAB — CBC
HEMATOCRIT: 27.7 % — AB (ref 39.0–52.0)
HEMOGLOBIN: 9.3 g/dL — AB (ref 13.0–17.0)
MCH: 28.7 pg (ref 26.0–34.0)
MCHC: 33.6 g/dL (ref 30.0–36.0)
MCV: 85.5 fL (ref 78.0–100.0)
Platelets: 308 10*3/uL (ref 150–400)
RBC: 3.24 MIL/uL — ABNORMAL LOW (ref 4.22–5.81)
RDW: 17 % — AB (ref 11.5–15.5)
WBC: 19.7 10*3/uL — AB (ref 4.0–10.5)

## 2013-04-27 LAB — BASIC METABOLIC PANEL
BUN: 28 mg/dL — ABNORMAL HIGH (ref 6–23)
CHLORIDE: 98 meq/L (ref 96–112)
CO2: 23 mEq/L (ref 19–32)
Calcium: 8.5 mg/dL (ref 8.4–10.5)
Creatinine, Ser: 0.96 mg/dL (ref 0.50–1.35)
GFR calc Af Amer: >90 mL/min (ref >90)
GFR calc non Af Amer: 81 mL/min — ABNORMAL LOW (ref >90)
Glucose, Bld: 126 mg/dL — ABNORMAL HIGH (ref 70–99)
POTASSIUM: 4.3 meq/L (ref 3.7–5.3)
Sodium: 139 mEq/L (ref 137–147)

## 2013-04-27 LAB — GLUCOSE, CAPILLARY
Glucose-Capillary: 121 mg/dL — ABNORMAL HIGH (ref 70–99)
Glucose-Capillary: 119 mg/dL — ABNORMAL HIGH (ref 70–99)
Glucose-Capillary: 90 mg/dL (ref 70–99)

## 2013-04-27 MED ORDER — LEVOFLOXACIN 500 MG PO TABS
500.0000 mg | ORAL_TABLET | Freq: Every day | ORAL | Status: DC
  Administered 2013-04-27 – 2013-05-03 (×7): 500 mg via ORAL
  Filled 2013-04-27 (×8): qty 1

## 2013-04-27 MED ORDER — CARVEDILOL 3.125 MG PO TABS: 3.1250 mg | ORAL_TABLET | Freq: Two times a day (BID) | ORAL | Status: DC

## 2013-04-27 NOTE — Care Management Note (Signed)
    Page 1 of 2   05/05/2013     11:53:21 AM   CARE MANAGEMENT NOTE 05/05/2013  Patient:  Hegg,Seyon   Account Number:  [id number]  Date Initiated:  04/20/2013  Documentation initiated by:  [facility]  Subjective/Objective Assessment:   Post op CABG/LOBECTOMY     Action/Plan:   Anticipated DC Date:  05/05/2013   Anticipated DC Plan:  [place]  CM consult      [facility] Choice  HOME HEALTH   Choice offered to / List presented to:  C-1 Patient        [place] arranged  HH-1 RN      [place].   Status of service:  Completed, signed off Medicare Important Message given?   (If response is "NO", the following Medicare IM given date fields will be blank) Date Medicare IM given:   Date Additional Medicare IM given:  05/05/2013  Discharge Disposition:  [place]  Per UR Regulation:  Reviewed for med. necessity/level of care/duration of stay  If discussed at [place] of Stay Meetings, dates discussed:    Comments:  Contact:  Wuebker,Paul Brother [phone]  05/05/13 Danyele Smejkal,RN,BSN [phone] PT [place].  HHRN TO FOLLOW UP AT DC; REFERRAL TO [facility], PER PT CHOICE.  START OF CARE 24-48H POST DC DATE. BROTHER'S ADDRESS: [address] [place] [address]  PHONE [phone]  05/02/13 Shakeeta Godette,RN,BSN [phone] STILL HAS CHEST TUBE WITH AIR LEAK.  PT STATES WILL STAY WITH HIS BROTHER IN [place]  AT DC FOR 1 MONTH POST DC.  DENIES ANY HOME NEEDS AT THIS TIME.  04/27/13 Aleyah Balik,RN,BSN [phone] PT STILL WITH CHEST TUBE; PROGRESSING FAIRLY WELL WITH AMBULATION.  MAY BENEFIT FROM P.T. CONSULT TO ASSESS FOR HOME NEEDS AT DC.  WILL FOLLOW PROGRESS.

## 2013-04-27 NOTE — Progress Notes (Signed)
CARDIAC REHAB PHASE I   PRE:  Rate/Rhythm: 94SR  BP:  Supine: 94/59  Sitting:   Standing:    SaO2: 91%RA  MODE:  Ambulation: 300 ft   POST:  Rate/Rhythm: 115ST  BP:  Supine:   Sitting: 101/65  Standing:    SaO2: 93-94%RA 1050-1125 Pt stated he was feeling a little better today. Walked 300 ft on RA with rolling walker and asst x 1. Stopped many times to rest due to pain. Wanted to stop at 150 ft but encouraged pt to go a little farther if he could. He walked 350 ft 2days ago. To recliner after walk. Heart rate not as elevated today as our walk yesterday. Brother in room.   Graylon Good, RN BSN  04/27/2013 11:24 AM

## 2013-04-27 NOTE — Progress Notes (Signed)
Pt refused to ambulate, states he is feeling weak and doesn't want to over do it.  Pt educated and encouraged.  Will continue to monitor pt closely.  Call bell within reach.

## 2013-04-27 NOTE — Progress Notes (Signed)
Pt had non-sustained heart rate in 140 -150 while resting in bed.  Pt asymptomatic. EKG completed, heart rated decreased back to 103. BP 110/73.  Call light in reach, RN will continue to monitor.   Nolon Nations, RN

## 2013-04-27 NOTE — Progress Notes (Addendum)
      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  He is no longer febrile, but does have a leukocytosis.    Objective: Vital signs in last 24 hours: Temp:  [98.9 F (37.2 C)-102.4 F (39.1 C)] 98.9 F (37.2 C) (02/05 0440) Pulse Rate:  [100-156] 156 (02/05 0440) Cardiac Rhythm:  [-] Junctional rhythm (02/04 2044) Resp:  [18] 18 (02/05 0440) BP: (89-112)/(46-73) 110/73 mmHg (02/05 0440) SpO2:  [94 %-99 %] 94 % (02/05 0440) Weight:  [158 lb 11.7 oz (72 kg)] 158 lb 11.7 oz (72 kg) (02/05 0644)  Intake/Output from previous day: 02/04 0701 - 02/05 0700 In: 120 [P.O.:120] Out: 402 [Urine:401; Stool:1]  General appearance: alert, cooperative and no distress Heart: regular rate and rhythm Lungs: clear to auscultation bilaterally Abdomen: soft, non-tender; bowel sounds normal; no masses,  no organomegaly Extremities: edema none appreciated  Wound: clean and dry  Lab Results:  Recent Labs  04/27/13 0502  WBC 19.7*  HGB 9.3*  HCT 27.7*  PLT 308   BMET:  Recent Labs  04/27/13 0502  NA 139  K 4.3  CL 98  CO2 23  GLUCOSE 126*  BUN 28*  CREATININE 0.96  CALCIUM 8.5    PT/INR: No results found for this basename: LABPROT, INR,  in the last 72 hours ABG    Component Value Date/Time   PHART 7.384 04/20/2013 2014   HCO3 23.6 04/20/2013 2014   TCO2 27 04/21/2013 1700   ACIDBASEDEF 1.0 04/20/2013 2014   O2SAT 99.0 04/20/2013 2014   CBG (last 3)   Recent Labs  04/24/13 0829  GLUCAP 114*    Assessment/Plan: S/P Procedure(s) (LRB): 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 CV- Sinus Tach, blood pressure on the low side- is on  Coreg at 6.25 mg BID 2. Pulm- off oxygen, good use of IS, however patient states struggling to get sputum up, will add Mucinex 3. Renal-creatinine okay, weight is at baseline, not on diuretic 4. ID- + UTI, leukocytosis, fever resolved, currently on Vanc, Urine culture pending, but may be able to switch to Cipro 5. Chest tube- continued air leak, CXR continues to have small pneumothorax- leave chest tube in place 6. DIspo- patient with UTI, blood and urine cultures remain pending, on Vanc, may benefit from transition to Cipro, will follow   LOS: 7 days    Ellwood Handler 04/27/2013  Air leak persists Fever down today- on vanco and levaquin- will change levaquin top PO

## 2013-04-28 ENCOUNTER — Inpatient Hospital Stay (HOSPITAL_COMMUNITY): Payer: Medicare Other

## 2013-04-28 ENCOUNTER — Encounter (HOSPITAL_COMMUNITY): Payer: Self-pay | Admitting: Radiology

## 2013-04-28 ENCOUNTER — Other Ambulatory Visit: Payer: Self-pay

## 2013-04-28 DIAGNOSIS — R000 Tachycardia, unspecified: Secondary | ICD-10-CM

## 2013-04-28 DIAGNOSIS — R230 Cyanosis: Secondary | ICD-10-CM | POA: Diagnosis not present

## 2013-04-28 DIAGNOSIS — R9431 Abnormal electrocardiogram [ECG] [EKG]: Secondary | ICD-10-CM

## 2013-04-28 DIAGNOSIS — I2510 Atherosclerotic heart disease of native coronary artery without angina pectoris: Principal | ICD-10-CM

## 2013-04-28 LAB — URINE CULTURE

## 2013-04-28 LAB — CREATININE, SERUM
CREATININE: 0.79 mg/dL (ref 0.50–1.35)
GFR calc Af Amer: >90 mL/min (ref >90)
GFR, EST NON AFRICAN AMERICAN: 88 mL/min — AB (ref >90)

## 2013-04-28 LAB — VANCOMYCIN, TROUGH: VANCOMYCIN TR: 13.2 ug/mL (ref 10.0–20.0)

## 2013-04-28 LAB — TROPONIN I: Troponin I: <0.3 ng/mL (ref <0.30)

## 2013-04-28 MED ORDER — IOHEXOL 350 MG/ML SOLN
100.0000 mL | Freq: Once | INTRAVENOUS | Status: AC | PRN
  Administered 2013-04-28: 100 mL via INTRAVENOUS

## 2013-04-28 MED ORDER — IOHEXOL 350 MG/ML SOLN: 80.0000 mL | Freq: Once | INTRAVENOUS | Status: AC | PRN

## 2013-04-28 NOTE — Progress Notes (Signed)
Pt. o2 saturation dropped after bowel movement placed pt on non rebreathe called doctor he arrived about 5 min later ordered chest x- ray and pt to be moved to [place] called report and moved pt.

## 2013-04-28 NOTE — Progress Notes (Signed)
ANTIBIOTIC CONSULT NOTE - FOLLOW UP  Pharmacy Consult for Vancomycin Indication: Empiric  Allergies  Allergen Reactions  . Augmentin [Amoxicillin-Pot Clavulanate] Other (See Comments)    "passed out" when taking augmentin and flagyl together  . Flagyl [Metronidazole] Other (See Comments)    "passed out" when taking augmentin and flagyl together  . Tape Other (See Comments)    Blisters (need to use paper tape)    Patient Measurements: Height: 6' (182.9 cm) Weight: 161 lb 6 oz (73.2 kg) IBW/kg (Calculated) : 77.6 Adjusted Body Weight:   Vital Signs: Temp: 98.6 F (37 C) (02/06 0542) Temp src: Oral (02/06 0542) BP: 107/70 mmHg (02/06 0542) Pulse Rate: 87 (02/06 0542) Intake/Output from previous day: 02/05 0701 - 02/06 0700 In: 240 [P.O.:240] Out: 850 [Urine:850] Intake/Output from this shift:    Labs:  Recent Labs  04/27/13 0502 04/28/13 0435  WBC 19.7*  --   HGB 9.3*  --   PLT 308  --   CREATININE 0.96 0.79   Estimated Creatinine Clearance: 87.7 ml/min (by C-G formula based on Cr of 0.79).  Recent Labs  04/28/13 0701  VANCOTROUGH 13.2     Microbiology: Recent Results (from the past 720 hour(s))  SURGICAL PCR SCREEN     Status: None   Collection Time    04/18/13 12:46 PM      Result Value Range Status   MRSA, PCR NEGATIVE  NEGATIVE Final   Staphylococcus aureus NEGATIVE  NEGATIVE Final   Comment:            The Xpert SA Assay (FDA     approved for NASAL specimens     in patients over 81 years of age),     is one component of     a comprehensive surveillance     program.  Test performance has     been validated by [employer] for patients greater     than or equal to 69 year old.     It is not intended     to diagnose infection nor to     guide or monitor treatment.  CULTURE, BLOOD (ROUTINE X 2)     Status: None   Collection Time    04/26/13  4:20 PM      Result Value Range Status   Specimen Description BLOOD LEFT ARM   Final   Special  Requests BOTTLES DRAWN AEROBIC AND ANAEROBIC 10CC   Final   Culture  Setup Time     Final   Value: 04/26/2013 22:15     Performed at [facility]   Culture     Final   Value:        BLOOD CULTURE RECEIVED NO GROWTH TO DATE CULTURE WILL BE HELD FOR 5 DAYS BEFORE ISSUING A FINAL NEGATIVE REPORT     Performed at [facility]   Report Status PENDING   Incomplete  CULTURE, BLOOD (ROUTINE X 2)     Status: None   Collection Time    04/26/13  4:32 PM      Result Value Range Status   Specimen Description BLOOD RIGHT HAND   Final   Special Requests     Final   Value: BOTTLES DRAWN AEROBIC AND ANAEROBIC BLUE 10CC RED 5CC   Culture  Setup Time     Final   Value: 04/26/2013 22:15     Performed at [place]     Final  Value:        BLOOD CULTURE RECEIVED NO GROWTH TO DATE CULTURE WILL BE HELD FOR 5 DAYS BEFORE ISSUING A FINAL NEGATIVE REPORT     Performed at [facility]   Report Status PENDING   Incomplete    Anti-infectives   Start     Dose/Rate Route Frequency Ordered Stop   04/27/13 1500  levofloxacin (LEVAQUIN) tablet 500 mg     500 mg Oral Daily 04/27/13 1143     04/26/13 1600  vancomycin (VANCOCIN) IVPB 750 mg/150 ml premix     750 mg 150 mL/hr over 60 Minutes Intravenous Every 8 hours 04/26/13 1545     04/26/13 1500  levofloxacin (LEVAQUIN) IVPB 500 mg  Status:  Discontinued     500 mg 100 mL/hr over 60 Minutes Intravenous Every 24 hours 04/26/13 1408 04/27/13 1143   04/21/13 1000  levofloxacin (LEVAQUIN) IVPB 750 mg     750 mg 100 mL/hr over 90 Minutes Intravenous Every 24 hours 04/20/13 1421 04/21/13 1150   04/20/13 2100  vancomycin (VANCOCIN) IVPB 1000 mg/200 mL premix     1,000 mg 200 mL/hr over 60 Minutes Intravenous  Once 04/20/13 1421 04/20/13 2251      Assessment: 73yom on Vancomycin and Levaquin Day 2 for empiric therapy (UTI vs PNA) - most likely etiology is UTI per MD notes. Patient is afebrile and cultures have reported  no growth. Vancomycin trough drawn this AM is 13.2 mcg/ml, which is therapeutic for UTI coverage. Will continue Vancomycin 750mg  IV q8h - expect some accumulation with q8h dosing in elderly. Follow-up plans to narrow therapy.  Goal of Therapy:  Vancomycin trough level 10-15 mcg/ml (for UTI; if PNA increase to 15-20)  Plan:  1. Continue Vancomycin 750mg  IV q8h 2. Continue LQ 500mg  PO daily 3. Monitor renal function, cultures, clinical course and follow-up de-escalation  Earleen Newport [phone] 04/28/2013,8:32 AM

## 2013-04-28 NOTE — Progress Notes (Signed)
CARDIAC REHAB PHASE I   PRE:  Rate/Rhythm: 83 SR    BP: sitting 106/67    SaO2: 96 RA  MODE:  Ambulation: 500 ft   POST:  Rate/Rhythm: 107 ST    BP: sitting 106/66     SaO2: wouldn't register  Tolerated well. No major problems. Tired toward end. Return to chair. Put RW in room so he can walk more. [phone]   Josephina Shih Mandan CES, ACSM 04/28/2013 11:51 AM

## 2013-04-28 NOTE — Discharge Summary (Signed)
Physician Discharge Summary       [address]       [place] [address]             [phone]    Patient ID: Troy [name] MRN: [id number] DOB/AGE: 1940-06-05 74 y.o.  Admit date: 04/20/2013 Discharge date: 05/04/2013  Admission Diagnoses: 1. Single vessel CAD 2. RUL mass 3. History of hypertension 4. History of hyperlipidemia 5. History of COPD 6.History of tobacco abuse 7. History of OSA  Discharge Diagnoses:  1. Single vessel CAD 2. 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 History of hypertension 4. History of hyperlipidemia 5. History of COPD 6.History of tobacco abuse 7. History of OSA 8. Mild ABL anemia  Procedure (s):  Median sternotomy, off pump coronary bypass grafting x1  (free right internal mammary artery to distal right coronary artery),  right upper lobectomy by Dr. Roxan Hockey on 04/20/2013.  Pathology: 1. Lung, resection (segmental or lobe), Right upper - INVASIVE WELL TO MODERATELY DIFFERENTIATED SQUAMOUS CELL CARCINOMA, SPANNING 2.7 CM IN GREATEST DIMENSION. - TUMOR ABUTS BUT DOES NOT DEFINITIVELY INVOLVE PLEURA. - MARGINS ARE NEGATIVE. - ONE BENIGN HILAR LYMPH NODE WITH NO TUMOR SEEN (0/1). - SEE ONCOLOGY TEMPLATE. 2. Lymph node, biopsy, Right internal mammary #1 - ONE BENIGN LYMPH NODE WITH NO TUMOR SEEN. (0/1). 3. Lymph node, biopsy, Right internal mammary #2 - ONE BENIGN LYMPH NODE WITH NO TUMOR SEEN. (0/1). 4. Lymph node, biopsy, Level 10 - ONE BENIGN LYMPH NODE WITH NO TUMOR SEEN. (0/1). 5. Lymph node, biopsy, Level 10 #2 - ONE BENIGN LYMPH NODE WITH NO TUMOR SEEN. (0/1). 6. Lymph node, biopsy, Level 10 #3 - ONE BENIGN LYMPH NODE WITH NO TUMOR SEEN. (0/1).  TNM code: pT1b, pN0, MX.  History of Presenting Illness: This is a 73 year old gentleman with a long history of coronary disease including angioplasty in the 1990s. He also has a history of tobacco abuse with one pack a day from age [age] (45 pack  years), he quit in 2004. He was in his usual state of health until last fall when he began having chest pain with exertion. He first noted this when he was trying to clean his gutters and rake leaves. This has now progressed to the point where he gets chest pain and associated shortness of breath with even minimal activity such as showering or walking around his house. He saw Dr. Johnsie Cancel and as part of his workup had a chest x-ray which revealed a right upper lobe mass.  A CT of the chest was done which showed a 2.1 cm spiculated mass in the right upper lobe. There was some central lobular emphysema primarily in the upper lobes. There was no mediastinal or hilar adenopathy. There also were extensive coronary calcifications.  He ultimately had a PET/CT which showed the lesion to be hypermetabolic. There was no evidence of regional or distant metastases.  As part of his workup for chest pain, he had cardiac catheterization which revealed a totally occluded right coronary. The distal vessel was well collateralized and there was no significant disease in the LAD or circumflex. However he failed medical therapy with continued angina which has now progressed to the point of occurring with even minimal activity. He has not had any rest or nocturnal angina.  Even before his chest pain became the limiting factor, his activities were somewhat limited by arthritis in his knees and chronic back pain. He is followed in a pain clinic for back pain.  He takes hydrocodone and Ultram for that. He has lost about 20 pounds over the past 3 months. He also has had decreased energy for several months. He sleeps on 2 pillows. 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 He has a productive cough with clear phlegm. He has not had any hemoptysis. He does not have a wheezing. Dr. Roxan Hockey discussed the need for coronary artery bypass grafting surgery and a right upper lobe lung resection. Potential risks,  complications, and benefits were discussed with the patient and he agreed to proceed. The patient requested that, if possible, both surgeries be done at the same time. Pre operative carotid US showed no significant carotid artery stenosis bilaterally.The patient presented to [facility] on 04/20/2013 in order to undergo a CABGx1 (off pump) and RUL.   Brief Hospital Course:  The patient was extubated the evening of surgery without difficulty. He remained afebrile and hemodynamically stable. [name], a line, and foley were removed early in the post operative course. Coreg was started and titrated accordingly. He was volume over loaded and diuresed. He was weaned off the insulin drip. The patient's HGA1C pre op was 6.2. He will requires further surveillance as an outpatient. The patient was felt surgically stable for transfer from the ICU to [facility] for further convalescence on 04/24/2012. Chest tube was placed to water seal. His chest tube did have an air leak, it was put back to suction, and it remained for several days post operatively. Gradually, the air leak resolved and the lung re expanded. Chest tube was removed on 05/03/2013.He did develop leukocytosis and a fever to 102.4. Etiology was a UTI. He was on Vanco and Levaquin IV. Levaquin IV was then changed to oral Levaquin and Vancomycin was discontinued.He continues to progress with cardiac rehab. He was ambulating on room air. His systolic blood pressure remained mostly in the low 90's. Coreg has been discontinued. He has been tolerating a diet. He did develop abdominal distention, but no abdominal pain, nausea, or emesis. He was constipated and was given oral laxatives as well as a Fleets enema. He did have a bowel movement. Epicardial pacing wires have already been removed. He is ambulating with assistance of rolling walker.  He is medically stable at this time and will be discharged home on 04/28/2013.   Latest Vital Signs: Blood pressure 78/48, pulse  107, temperature 100 F (37.8 C), temperature source Oral, resp. rate 16, height 6' (1.829 m), weight 70.171 kg (154 lb 11.2 oz), SpO2 96.00%.  Physical Exam: Cardiovascular: RRR, no murmurs, gallops, or rubs.  Pulmonary: Clear to auscultation bilaterally; no rales, wheezes, or rhonchi.  Abdomen: Soft, non tender, bowel sounds present.  Extremities: Mild bilateral lower extremity edema.  Wounds: Clean and dry. No erythema or signs of infection.   Discharge Condition:Stable  Recent laboratory studies:  Lab Results  Component Value Date   WBC 14.8* 05/03/2013   HGB 9.6* 05/03/2013   HCT 28.7* 05/03/2013   MCV 85.7 05/03/2013   PLT 700* 05/03/2013   Lab Results  Component Value Date   NA 139 05/01/2013   K 4.8 05/01/2013   CL 102 05/01/2013   CO2 25 05/01/2013   CREATININE 0.87 05/01/2013   GLUCOSE 115* 05/01/2013      Diagnostic Studies: Dg Chest 2 View  04/18/2013   CLINICAL DATA:  Pre admit right lung surgery, CABG  EXAM: CHEST  2 VIEW  COMPARISON:  PET-CT dated 03/14/2013.  CT chest dated 02/28/2013.  FINDINGS: 2.2 cm nodule in the right mid lung, corresponding to hypermetabolic right upper lobe nodule on PET-CT.  Chronic interstitial markings/ emphysematous changes. No focal consolidation. No pleural effusion or pneumothorax.  The heart is normal in size.  Surgical clips at the GE junction.  Mild inferior endplate compression fracture at T7 and mild superior endplate compression fracture at L1, unchanged from prior CT.  IMPRESSION: 2.2 cm right upper lobe pulmonary nodule.  Otherwise, no evidence of acute cardiopulmonary disease.   Electronically Signed   By: Julian Hy M.D.   On: 04/18/2013 17:38   Dg Chest Port 1 View  04/28/2013   CLINICAL DATA:  Status post right thoracic surgery.  EXAM: PORTABLE CHEST - 1 VIEW  COMPARISON:  04/27/2013  FINDINGS: Right pneumothorax has increased, now 20-30%.  Right chest tube is stable. Minor lung base atelectasis is also stable. No pleural effusions.   Changes from CABG surgery are stable. Cardiac silhouette is mildly enlarged.  IMPRESSION: 1. Right pneumothorax has increased in size from the previous day's study now approximate 20 of 30%. 2. No other change.   Electronically Signed   By: Lajean Manes M.D.   On: 04/28/2013 08:06   Dg Chest Port 1 View  04/27/2013   CLINICAL DATA:  Post right lung surgery, right chest tube  EXAM: PORTABLE CHEST - 1 VIEW  COMPARISON:  Portable exam 0546 hr compared to 04/26/2013  FINDINGS: Right thoracostomy tube present.  Decrease in right pneumothorax since previous exam.  Enlargement of cardiac silhouette post CABG.  Calcified tortuous thoracic aorta.  Mildly prominent right peritracheal soft tissue stable.  Minimal opacity at lung bases bilaterally question atelectasis.  Remaining lungs clear.  No gross pleural effusion.  IMPRESSION: Decrease in right pneumothorax.  Minimal opacity at the lung bases, favor atelectasis, little changed.   Electronically Signed   By: Lavonia Dana M.D.   On: 04/27/2013 08:04   Dg Chest Port 1 View  04/26/2013   CLINICAL DATA:  Pneumothorax.  EXAM: PORTABLE CHEST - 1 VIEW  COMPARISON:  04/25/2013  FINDINGS: A right-sided chest tube is in similar orientation. There is a small right-sided pneumothorax which has increased since prior, now 3 cm at the apex, previously 8 mm.  No change in lung aeration. Reticular opacities at the bases is likely a combination of bronchitic change and mild atelectasis. Stable mediastinal contours, distorted by rightward rotation. No cardiomegaly.  IMPRESSION: A right-sided pneumothorax has doubled or tripled since yesterday, but remains small volume.   Electronically Signed   By: Jorje Guild M.D.   On: 04/26/2013 05:34   Dg Chest Port 1 View  04/25/2013   CLINICAL DATA:  Chest tube to water seal  EXAM: PORTABLE CHEST - 1 VIEW  COMPARISON:  04/24/2013  FINDINGS: Right chest tube again identified. Small apical pneumothorax measuring 7 mm. Stable cardiac  enlargement status post CABG. Mild bibasilar atelectasis with lungs otherwise clear.  IMPRESSION: No change tiny right apical pneumothorax.   Electronically Signed   By: Skipper Cliche M.D.   On: 04/25/2013 07:58   Dg Chest Port 1 View  04/24/2013   CLINICAL DATA:  Pneumothorax.  Chest tube.  EXAM: PORTABLE CHEST - 1 VIEW  COMPARISON:  04/23/2013  FINDINGS: Right chest tubes stable. Small right-sided pneumothorax is also stable  No acute findings in the lungs.  No pleural effusion.  Changes from CABG surgery are stable.  IMPRESSION: 1. Stable small right pneumothorax.  Stable right-sided chest tube. 2. No change  from the previous day's study.   Electronically Signed   By: Lajean Manes M.D.   On: 04/24/2013 07:17   Dg Chest Port 1 View  04/23/2013   CLINICAL DATA:  Postop.  Evaluate chest tubes.  EXAM: PORTABLE CHEST - 1 VIEW  COMPARISON:  04/22/2013  FINDINGS: Right pneumothorax has significantly decreased in size, now only a small apical pneumothorax.  Right chest tube is stable. Right internal jugular introducer sheath is stable.  No left pneumothorax. No evidence of pulmonary edema or lung infiltrates. No mediastinal widening.  IMPRESSION: 1. Improved right pneumothorax now with only a small right apical pneumothorax 2. No other change.   Electronically Signed   By: Lajean Manes M.D.   On: 04/23/2013 08:07   Dg Chest Port 1 View  04/22/2013   CLINICAL DATA:  Postop cardiac surgery  EXAM: PORTABLE CHEST - 1 VIEW  COMPARISON:  the previous day's study  FINDINGS: Interval removal of 1 of the 2 right chest tubes. A moderate right pneumothorax is now conspicuous, lung apex projecting at the posterior margin of the right fifth rib, with a moderate lateral compartment to the pneumothorax as well . There is some associated atelectasis versus early infiltrate at the right lung base. The Swan-Ganz has been retracted leaving the right IJ venous sheath in place. Changes of CABG. Stable cardiomegaly. Tortuous  atheromatous aorta. No effusion.  IMPRESSION: 1. New moderate right pneumothorax after removal of 1 of the 2 right chest tubes. Critical Value/emergent results were called by telephone at the time of interpretation at 8:12 AM to [facility] in the unit, who verbally acknowledged these results.   Electronically Signed   By: Arne Cleveland M.D.   On: 04/22/2013 08:13   Dg Chest Portable 1 View In Am  04/21/2013   CLINICAL DATA:  Coronary artery disease.  Status post CABG.  EXAM: PORTABLE CHEST - 1 VIEW  COMPARISON:  04/20/2013 and 04/18/2013  FINDINGS: Endotracheal tube and NG tube have been removed. Chest tubes and Swan-Ganz catheter remain. No pneumothorax. Lungs are clear. 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  IMPRESSION: No pneumothorax.  Lungs are clear.   Electronically Signed   By: Rozetta Nunnery M.D.   On: 04/21/2013 07:45   Dg Chest Portable 1 View  04/20/2013   CLINICAL DATA:  Status post cardiac surgery  EXAM: PORTABLE CHEST - 1 VIEW  COMPARISON:  04/18/2013  FINDINGS: Postsurgical changes are seen. An endotracheal tube is noted approximately 5 cm above the carina. A nasogastric catheter is noted within the stomach. A mediastinal drain and 2 right-sided chest tubes are seen. No pneumothorax is noted. A right-sided Swan-Ganz catheter is noted and somewhat coiled within the pulmonary outflow tract. The left lung is clear. No acute bony abnormality is noted.  IMPRESSION: Postsurgical change with tubes and lines as described. No acute abnormality is noted. A Swan-Ganz catheter somewhat coiled upon itself within the pulmonary outflow tract.   Electronically Signed   By: Inez Catalina M.D.   On: 04/20/2013 14:52        Future Appointments Provider Department Dept Phone   05/12/2013 3:30 PM Josue Hector, MD [facility] [phone]   05/23/2013 11:30 AM Melrose Nakayama, MD [facility] [phone]      Discharge  Medications:    Medication List    STOP taking these medications       carvedilol 6.25 MG tablet  Commonly known as:  COREG  isosorbide mononitrate 30 MG 24 hr tablet  Commonly known as:  IMDUR      TAKE these medications       aspirin 325 MG EC tablet  Take 325 mg by mouth daily as needed (angina).     diazepam 10 MG tablet  Commonly known as:  VALIUM  Take 10 mg by mouth 2 (two) times daily as needed for anxiety (for back spasms).     HYDROcodone-acetaminophen 10-325 MG per tablet  Commonly known as:  NORCO  Take 1 tablet by mouth every 6 (six) hours as needed (pain). prn     ibuprofen 200 MG tablet  Commonly known as:  ADVIL,MOTRIN  Take 200 mg by mouth daily as needed (pain).     montelukast 10 MG tablet  Commonly known as:  SINGULAIR  Take 10 mg by mouth daily.     PROAIR HFA 108 (90 BASE) MCG/ACT inhaler  Generic drug:  albuterol  Inhale 2 puffs into the lungs daily as needed for wheezing or shortness of breath.     traMADol 50 MG tablet  Commonly known as:  ULTRAM  Take 100 mg by mouth 3 (three) times daily as needed (pain).     vitamin B-12 1000 MCG tablet  Commonly known as:  CYANOCOBALAMIN  Take 1,000 mcg by mouth daily.     vitamin E 400 UNIT capsule  Take 400 Units by mouth daily.     zolpidem 10 MG tablet  Commonly known as:  AMBIEN  Take 10 mg by mouth at bedtime as needed for sleep.       The patient has been discharged on:   1.Beta Blocker:  Yes [   ]                              No   [  x ]                              If No, reason: Hypotension  2.Ace Inhibitor/ARB: Yes [   ]                                     No  [  x  ]                                     If No, reason: Hypotension  3.Statin:   Yes [ x  ]                  No  [   ]                  If No, reason:  4.Ecasa:  Yes  [ x  ]                  No   [   ]                  If No, reason:    Follow Up Appointments: Follow-up Information   Follow up with Jenkins Rouge, MD On 05/12/2013. (3:30)    Specialty:  Cardiology   Contact information:   [address] [place] [address] [phone]  Follow up with Melrose Nakayama, MD On 05/23/2013. (PA/LAT CXR (to be taken at [facility] which isin the same building as Dr. Leonarda Salon office) on 05/23/2013 at 10:30 am;Appointment time is at 11:30 am)    Specialty:  Cardiothoracic Surgery   Contact information:   [address] [place] [address] [phone]       Follow up with Medical Doctor. (Call for a follow up appointment regarding further surveillance of HGA1C 6.2)       Signed: ZIMMERMAN,DONIELLE MPA-C 05/04/2013, 12:05 PM

## 2013-04-28 NOTE — Consult Note (Signed)
Referring Physician: Roxan Hockey Primary Cardiologist: Johnsie Cancel Reason for Consultation: SOB, tachycardia, abnormal ECG   HPI:  Troy [name] is a 73 year old gentleman with a long history of coronary disease including angioplasty in the 1990s. He also has a history of GERD s/p Nissen fundoplication, COPD with tobacco abuse with one pack a day from age [age] (70 pack years), he quit in 2004. He saw Dr. Johnsie Cancel and as part of his workup had a chest x-ray which revealed a right upper lobe mass.   A CT of the chest was done which showed a 2.1 cm spiculated mass in the right upper lobe. There was some central lobular emphysema primarily in the upper lobes. There was no mediastinal or hilar adenopathy. There also were extensive coronary calcifications. As part of his workup for chest pain he had cardiac catheterization 03/01/13 which revealed a totally occluded right coronary. The distal vessel was well collateralized. Non-obs CAD in LAD and LCX  Left mainstem: Short segment normal  Left anterior descending (LAD): Heavily calcified. 50% diffuse disease proximally 80% ostial first septal perforator Large vessel wraps apex  D1: normal  D2: small and normal  D3: normal  Left circumflex (LCx): Normal proximally AV groove ostium 60-70%  OM1: small and normal  OM2: large with 30% ostial stenosis at AV groove take off  Right coronary artery (RCA): Heavily calcified 100% occluded in proximal portion. Good collaterals from left  On 04/20/13 he underwent single vessel CABG (SVG-RCA) and RUL lobectomy. Has done fairly well post-operatively except for persistent air leak in CT and UTI.   This evening got up to have BM. After BM, patient was shaking uncontrollably- more c/w shivering NOT szr activity  He was on a NRB - hands and feet were blue and skin generally mottled. He had no complaint of abdominal pain but had mild nausea. He felt cold then hot. He was diaphoretic. He was initially hypertensive and  tachycardic. Fingers turned blue. CXR showed good inflation of the right lung(CT was back on suction by this point). er the next several minutes he improved without any significant interventions other than a non-rebreather mask O2  His HR came down to 107 sinus tach- BP 120/80   12 lead EKG done- showed ST elevation in V2 which was more prominent than previous. Chest is sore post-op but denies angina or dyspnea currently. Afebrile.  I did bedside echo. Limited views. LV and RV normal. No effusion.    Review of Systems:     Cardiac Review of Systems: {Y] = yes [ ]  = no  Chest Pain [    ]  Resting SOB [ y ] Exertional SOB  [ y]  Orthopnea [  ]   Pedal Edema [   ]    Palpitations [  ] Syncope  [  ]   Presyncope [   ]  General Review of Systems: [Y] = yes [  ]=no Constitional: recent weight change [  ]; anorexia [  ]; fatigue [  ]; nausea [  ]; night sweats [  ]; fever [  ]; or chills [  ];  Eye : blurred vision [  ]; diplopia [   ]; vision changes [  ];  Amaurosis fugax[  ]; Resp: cough [  ];  wheezing[  ];  hemoptysis[  ]; shortness of breath[ y]; paroxysmal nocturnal dyspnea[  ]; dyspnea on exertion[  ]; or orthopnea[  ];  GI:  gallstones[  ], vomiting[  ];  dysphagia[  ]; melena[  ];  hematochezia [  ]; heartburn[  ];   Hx of  Colonoscopy[  ]; GU: kidney stones [  ]; hematuria[  ];   dysuria [  ];  nocturia[  ];  history of     obstruction [  ];                 Skin: rash, swelling[  ];, hair loss[  ];  peripheral edema[  ];  or itching[  ]; Musculosketetal: myalgias[  ];  joint swelling[  ];  joint erythema[  ];  joint pain[ y];  back pain[  ];  Heme/Lymph: bruising[  ];  bleeding[  ];  anemia[  ];  Neuro: TIA[  ];  headaches[  ];  stroke[  ];  vertigo[  ];  seizures[  ];   paresthesias[  ];  difficulty walking[  ];  Psych:depression[  ]; anxiety[   ];  Endocrine: diabetes[  ];  thyroid dysfunction[  ];  Other:  Past Medical History  Diagnosis Date  . CAD (coronary artery disease)   . HTN (hypertension)   . COPD (chronic obstructive pulmonary disease)   . Hyperlipidemia   . Depression   . Vitamin D deficiency   . Sleep apnea   . Anginal pain   . Shortness of breath   . Arthritis   . Polycystic kidney disease     Medications Prior to Admission  Medication Sig Dispense Refill  . aspirin 325 MG EC tablet Take 325 mg by mouth daily as needed (angina).      . carvedilol (COREG) 6.25 MG tablet Take 6.25 mg by mouth 2 (two) times daily with a meal.       . diazepam (VALIUM) 10 MG tablet Take 10 mg by mouth 2 (two) times daily as needed for anxiety (for back spasms).      Marland Kitchen HYDROcodone-acetaminophen (NORCO) 10-325 MG per tablet Take 1 tablet by mouth every 6 (six) hours as needed (pain). prn      . ibuprofen (ADVIL,MOTRIN) 200 MG tablet Take 200 mg by mouth daily as needed (pain).      . isosorbide mononitrate (IMDUR) 30 MG 24 hr tablet Take 15 mg by mouth daily.      . montelukast (SINGULAIR) 10 MG tablet Take 10 mg by mouth daily.       . traMADol (ULTRAM) 50 MG tablet Take 100 mg by mouth 3 (three) times daily as needed (pain).       . vitamin B-12 (CYANOCOBALAMIN) 1000 MCG tablet Take 1,000 mcg by mouth daily.      . vitamin E 400 UNIT capsule Take 400 Units by mouth daily.      Marland Kitchen zolpidem (AMBIEN) 10 MG tablet Take 10 mg by mouth at bedtime as needed for sleep.       Marland Kitchen albuterol (PROAIR HFA) 108 (90 BASE) MCG/ACT inhaler Inhale 2 puffs into the lungs daily as needed for wheezing or shortness of breath.         Marland Kitchen aspirin EC  325 mg Oral Daily   Or  . aspirin  324 mg Per Tube Daily  . bisacodyl  10 mg Oral Daily   Or  . bisacodyl  10 mg Rectal Daily  . carvedilol  6.25 mg Oral BID WC  . docusate sodium  200 mg Oral Daily  . enoxaparin (LOVENOX) injection  40 mg Subcutaneous QHS  . feeding supplement (ENSURE COMPLETE)  237  mL Oral TID BM  . levofloxacin  500 mg Oral Daily  . montelukast  10 mg Oral QHS  . pantoprazole  40 mg Oral Daily    Infusions: . sodium chloride Stopped (04/21/13 0800)  . sodium chloride Stopped (04/24/13 1750)    Allergies  Allergen Reactions  . Augmentin [Amoxicillin-Pot Clavulanate] Other (See Comments)    "passed out" when taking augmentin and flagyl together  . Flagyl [Metronidazole] Other (See Comments)    "passed out" when taking augmentin and flagyl together  . Tape Other (See Comments)    Blisters (need to use paper tape)    History   Social History  . Marital Status: Widowed    Spouse Name: N/A    Number of Children: 0  . Years of Education: N/A   Occupational History  . Retired    Social History Main Topics  . Smoking status: Former Smoker -- 1.00 packs/day for 25 years    Types: Cigarettes    Quit date: 03/23/2002  . Smokeless tobacco: Never Used  . Alcohol Use: No  . Drug Use: No  . Sexual Activity: Not on file   Other Topics Concern  . Not on file   Social History Narrative  . No narrative on file    Family History  Problem Relation Age of Onset  . Lung cancer Mother     worked at a [employer]  . Heart disease Mother   . Stomach cancer Maternal Grandmother   . Brain cancer Father     PHYSICAL EXAM: Filed Vitals:   04/28/13 1403  BP: 95/61  Pulse: 86  Temp: 98.1 F (36.7 C)  Resp: 18     Intake/Output Summary (Last 24 hours) at 04/28/13 1804 Last data filed at 04/28/13 1300  Gross per 24 hour  Intake    360 ml  Output   1100 ml  Net   -740 ml    General:  Elderly frail appearing. No respiratory difficulty HEENT: normal Neck: supple. no JVDP 7 Carotids 2+ bilat; no bruits. No lymphadenopathy or thryomegaly appreciated. Cor: CABG scar well approximated. PMI nondisplaced. Regular rate & rhythm. No rubs, gallops or murmurs. Lungs: mild rhonchi Abdomen: soft, nontender, nondistended. No hepatosplenomegaly. No bruits or  masses. Good bowel sounds. Extremities: no cyanosis, clubbing, rash, edema Neuro: alert & oriented x 3, cranial nerves grossly intact. moves all 4 extremities w/o difficulty. Affect pleasant.  ECG:ST 104 68mm ST elevation v2. J point elevation v3   Results for orders placed during the hospital encounter of 04/20/13 (from the past 24 hour(s))  GLUCOSE, CAPILLARY     Status: None   Collection Time    04/27/13  8:50 PM      Result Value Range   Glucose-Capillary 90  70 - 99 mg/dL  CREATININE, SERUM     Status: Abnormal   Collection Time    04/28/13  4:35 AM      Result Value Range   Creatinine, Ser 0.79  0.50 - 1.35 mg/dL   GFR calc non Af Amer 88 (*) >90 mL/min   GFR calc Af Amer >90  >90 mL/min  VANCOMYCIN, TROUGH     Status: None   Collection Time    04/28/13  7:01 AM      Result Value Range   Vancomycin Tr 13.2  10.0 - 20.0 ug/mL   Dg Chest Port 1 View  04/28/2013   CLINICAL DATA:  Increased shortness of breath, chest tube, history hypertension, coronary artery disease, COPD  EXAM: PORTABLE CHEST - 1 VIEW  COMPARISON:  Portable exam 1707 hr compared earlier study of 04/28/2013  FINDINGS: Enlargement of cardiac silhouette post CABG.  Mediastinal contours stable with calcification and elongation of thoracic aorta.  Right thoracostomy tube unchanged.  Volume loss in right chest.  Previously identified right pneumothorax is not definitely visualized, though due to slight patient rotation the superior mediastinum projects over the right apex.  Mild right basilar atelectasis.  Left lung clear.  Central bronchitic changes noted.  Bones demineralized.  IMPRESSION: Right apex pneumothorax seen on previous exam is not identified on current exam as above.   Electronically Signed   By: Lavonia Dana M.D.   On: 04/28/2013 17:28   Dg Chest Port 1 View  04/28/2013   CLINICAL DATA:  Status post right thoracic surgery.  EXAM: PORTABLE CHEST - 1 VIEW  COMPARISON:  04/27/2013  FINDINGS: Right pneumothorax has  increased, now 20-30%.  Right chest tube is stable. Minor lung base atelectasis is also stable. No pleural effusions.  Changes from CABG surgery are stable. Cardiac silhouette is mildly enlarged.  IMPRESSION: 1. Right pneumothorax has increased in size from the previous day's study now approximate 20 of 30%. 2. No other change.   Electronically Signed   By: Lajean Manes M.D.   On: 04/28/2013 08:06   Dg Chest Port 1 View  04/27/2013   CLINICAL DATA:  Post right lung surgery, right chest tube  EXAM: PORTABLE CHEST - 1 VIEW  COMPARISON:  Portable exam 0546 hr compared to 04/26/2013  FINDINGS: Right thoracostomy tube present.  Decrease in right pneumothorax since previous exam.  Enlargement of cardiac silhouette post CABG.  Calcified tortuous thoracic aorta.  Mildly prominent right peritracheal soft tissue stable.  Minimal opacity at lung bases bilaterally question atelectasis.  Remaining lungs clear.  No gross pleural effusion.  IMPRESSION: Decrease in right pneumothorax.  Minimal opacity at the lung bases, favor atelectasis, little changed.   Electronically Signed   By: Lavonia Dana M.D.   On: 04/27/2013 08:04     ASSESSMENT: 1. Dyspnea/tachycardia 2. Abnormal ECG 3. Transient cyanosis 4. CAD s/p CABG 5. Lung CA s/p RUL lobectomy 04/20/13 6. UTI  PLAN/DISCUSSION:  I am unsure what caused his transient episode of tachycardia, shakes and dyspnea. By history seems like vagal episode but constellation of symptoms not totally consistent with this. ECG with isolated ST elevation in V2 but no angina and echo without regional wall motion abnormality so not felt to be ischemia. Also discussed possibility of PE but echo shows normal RV function and he is now back to baseline so also less likely but agree with CT to confirm.  Would continue to follow clinically. Low threshold to get culture if any infectious sx.   Troy Miske,MD 6:18 PM

## 2013-04-28 NOTE — Progress Notes (Addendum)
      [address]       [place] [address]             [phone]        8 Days Post-Op Procedure(s) (LRB): OFF PUMP CORONARY ARTERY BYPASS GRAFTING (CABG) (N/A) INTRAOPERATIVE TRANSESOPHAGEAL ECHOCARDIOGRAM (N/A) LOBECTOMY  Subjective: Patient eating breakfast without complaints.  Objective: Vital signs in last 24 hours: Temp:  [98.4 F (36.9 C)-98.7 F (37.1 C)] 98.6 F (37 C) (02/06 0542) Pulse Rate:  [83-90] 87 (02/06 0542) Cardiac Rhythm:  [-] Normal sinus rhythm (02/05 2105) Resp:  [18] 18 (02/06 0542) BP: (93-107)/(60-70) 107/70 mmHg (02/06 0542) SpO2:  [94 %-98 %] 98 % (02/06 0542) Weight:  [73.2 kg (161 lb 6 oz)] 73.2 kg (161 lb 6 oz) (02/06 0542)  Pre op weight  76 kg Current Weight  04/28/13 73.2 kg (161 lb 6 oz)      Intake/Output from previous day: 02/05 0701 - 02/06 0700 In: 240 [P.O.:240] Out: 850 [Urine:850]   Physical Exam:  Cardiovascular: RRR, no murmurs, gallops, or rubs. Pulmonary: Clear to auscultation bilaterally; no rales, wheezes, or rhonchi. Abdomen: Soft, non tender, bowel sounds present. Extremities: Mild bilateral lower extremity edema. Wounds: Clean and dry.  No erythema or signs of infection.  Lab Results: CBC: Recent Labs  04/27/13 0502  WBC 19.7*  HGB 9.3*  HCT 27.7*  PLT 308   BMET:  Recent Labs  04/27/13 0502 04/28/13 0435  NA 139  --   K 4.3  --   CL 98  --   CO2 23  --   GLUCOSE 126*  --   BUN 28*  --   CREATININE 0.96 0.79  CALCIUM 8.5  --     PT/INR:  Lab Results  Component Value Date   INR 1.16 04/20/2013   INR 0.93 04/18/2013   INR 1.1* 02/24/2013   ABG:  INR: Will add last result for INR, ABG once components are confirmed Will add last 4 CBG results once components are confirmed  Assessment/Plan:  1. CV - SR/PVCs. On Coreg 6.25 bid 2.  Pulmonary - Chest tube has an air leak with cough.CXR appears to show small, stable right ptx. Leave chest tube for now.Encourage  incentive spirometer. 3.  Acute blood loss anemia - H and H yesterday 9.3 and 27.7 4. Leukocytosis-WBC yesterday 19.7. Remains afebrile. Etiology is UTI. UC results pending. Is on Vanco and oral Levaquin.  ZIMMERMAN,DONIELLE MPA-C 04/28/2013,8:01 AM  Patient seen and examined, agree with above Will dc vanco, continue levaquin Air leak still present- pneumo/ space a little bigger - continue with CT on water seal

## 2013-04-28 NOTE — Significant Event (Signed)
Rapid Response Event Note  Overview:  1700 Called to see patient for decreased sats.    Initial Focused Assessment: Upon arrival patient is grey, mottled, alert and responsive, on non rebreather mask. MD at bedside. Sats 88-89%.   Interventions: Assisted with monitoring, transfer to SICU at 1740  Event Summary:   Patient transferred to 2S14 in bed with O2 and monitoring at  1740.    at          Baron Hamper

## 2013-04-28 NOTE — Progress Notes (Signed)
CTSP for sudden onset of SOB, tachycardia and oxygen desaturation @ 1700  On arrival patient was shaking uncontrollably- more c/w shivering NOT szr activity He was on a NRB - hands and feet were blue and skin generally mottled. He had just returned to bed after a large bowel movement. He had no complaint of abdominal pain but had mild nausea. He felt cold then hot. He was diaphoretic. He was initially hypertensive and tachycardic. His air leak was unchanged and he did have bilateral BS  CXR showed good inflation of the right lung(CT was back on suction by this point)  Over the next several minutes he improved without any significant interventions other than a non-rebreather mask O2  His HR came down to 107 sinus tach- BP 120/80  12 lead EKG done- showed ST elevation in V2  Transferred to ICu.  On arrival to ICU 1745 he feels much better. He is without complaint except for incisional pain.  108 ST 110/73 100% sat on 6L [place] extremities well perfused  Dr. Haroldine Laws just finished a bedside echo which showed good LV function with no significant wall motion abnormality  Will do Ct chest to r/o PE and observe  Cycle Troponin and EKG to r/o MI

## 2013-04-28 NOTE — Discharge Instructions (Signed)
Activity: 1.May walk up steps °               2.No lifting more than ten pounds for four weeks.  °               3.No driving for four weeks. °               4.Stop any activity that causes chest pain, shortness of breath, dizziness,                            sweating or excessive weakness. °               5.Avoid straining. °               6.Continue with your breathing exercises daily. ° °Diet: Diabetic diet and Low fat, Low salt  diet ° °Wound Care: May shower.  Clean wounds with mild soap and water daily. Contact the office at [phone] if any problems arise. ° °Coronary Artery Bypass Grafting, Care After °Refer to this sheet in the next few weeks. These instructions provide you with information on caring for yourself after your procedure. Your health care provider may also give you more specific instructions. Your treatment has been planned according to current medical practices, but problems sometimes occur. Call your health care provider if you have any problems or questions after your procedure. °WHAT TO EXPECT AFTER THE PROCEDURE °Recovery from surgery will be different for everyone. Some people feel well after 3 or 4 weeks, while for others it takes longer. After your procedure, it is typical to have the following: °· Nausea and a lack of appetite.   °· Constipation. °· Weakness and fatigue.   °· Depression or irritability.   °· Pain or discomfort at your incision site. °HOME CARE INSTRUCTIONS °· Only take over-the-counter or prescription medicines as directed by your health care provider. Take all medicines exactly as directed. Do not stop taking medicines or start any new medicines without first checking with your health care provider.   °· Take your pulse as directed by your health care provider. °· Perform deep breathing as directed by your health care provider. If you were given a device called an incentive spirometer, use it to practice deep breathing several times a day. Support your chest with  a pillow or your arms when you take deep breaths or cough. °· Keep incision areas clean, dry, and protected. Remove or change any bandages (dressings) only as directed by your health care provider. You may have skin adhesive strips over the incision areas. Do not take the strips off. They will fall off on their own. °· Check incision areas daily for any swelling, redness, or drainage. °· If incisions were made in your legs, do the following: °· Avoid crossing your legs.   °· Avoid sitting for long periods of time. Change positions every 30 minutes.   °· Elevate your legs when you are sitting.   °· Wear compression stockings as directed by your health care provider. These stockings help keep blood clots from forming in your legs. °· Take showers once your health care provider approves. Until then, only take sponge baths. Pat incisions dry. Do not rub incisions with a washcloth or towel. Do not take tub baths or go swimming until your health care provider approves. °· Eat foods that are high in fiber, such as raw fruits and vegetables, whole grains, beans, and nuts. Meats should be lean cut. Avoid   canned, processed, and fried foods. °· Drink enough fluids to keep your urine clear or pale yellow. °· Weigh yourself every day. This helps identify if you are retaining fluid that may make your heart and lungs work harder.   °· Rest and limit activity as directed by your health care provider. You may be instructed to: °· Stop any activity at once if you have chest pain, shortness of breath, irregular heartbeats, or dizziness. Get help right away if you have any of these symptoms. °· Move around frequently for short periods or take short walks as directed by your health care provider. Increase your activities gradually. You may need physical therapy or cardiac rehabilitation to help strengthen your muscles and build your endurance. °· Avoid lifting, pushing, or pulling anything heavier than 10 lb (4.5 kg) for at least 6  weeks after surgery. °· Do not drive until your health care provider approves.  °· Ask your health care provider when you may return to work and resume sexual activity. °· Follow up with your health care provider as directed.   °SEEK MEDICAL CARE IF: °· You have swelling, redness, increasing pain, or drainage at the site of an incision.   °· You develop a fever.   °· You have swelling in your ankles or legs.   °· You have pain in your legs.   °· You have weight gain of 2 or more pounds a day. °· You are nauseous or vomit. °· You have diarrhea.  °SEEK IMMEDIATE MEDICAL CARE IF: °· You have chest pain that goes to your jaw or arms. °· You have shortness of breath.   °· You have a fast or irregular heartbeat.   °· You notice a "clicking" in your breastbone (sternum) when you move.   °· You have numbness or weakness in your arms or legs. °· You feel dizzy or lightheaded.   °MAKE SURE YOU: °· Understand these instructions. °· Will watch your condition. °· Will get help right away if you are not doing well or get worse. °Document Released: 09/26/2004 Document Revised: 11/09/2012 Document Reviewed: 08/16/2012 °[employer]® Patient Information ©2014 [employer]. ° ° ° °

## 2013-04-28 NOTE — Progress Notes (Signed)
[facility] BRIEF SICU PROGRESS NOTE  8 Days Post-Op  S/P Procedure(s) (LRB): OFF PUMP CORONARY ARTERY BYPASS GRAFTING (CABG) (N/A) INTRAOPERATIVE TRANSESOPHAGEAL ECHOCARDIOGRAM (N/A) LOBECTOMY   Feels some better NSR w/ stable vitals  Plan: Continue to monitor in SICU - await chest CTA  OWEN,CLARENCE H 04/28/2013 7:35 PM

## 2013-04-28 NOTE — Progress Notes (Signed)
Several attempts made to initiate PIV for STAT CHEST CT,  Patient needs 20G.    Attempts made by IV RNs, ED RNs, and ICU RNs. MD aware, per MD called PICC for picc line placement STAT, no IV PICC RN on duty tonight, per MD paged Radiologist on call, MD (Dr. Roxy Manns) spoke with MD (Radiologist on [facility]) for IR PICC placement.  Informed patient of the situation, patient stable, VS stable, CV stable, neuro status stable.

## 2013-04-29 ENCOUNTER — Inpatient Hospital Stay (HOSPITAL_COMMUNITY): Payer: Medicare Other

## 2013-04-29 LAB — BASIC METABOLIC PANEL
BUN: 21 mg/dL (ref 6–23)
CO2: 24 mEq/L (ref 19–32)
CREATININE: 0.77 mg/dL (ref 0.50–1.35)
Calcium: 9 mg/dL (ref 8.4–10.5)
Chloride: 99 mEq/L (ref 96–112)
GFR calc Af Amer: >90 mL/min (ref >90)
GFR, EST NON AFRICAN AMERICAN: 89 mL/min — AB (ref >90)
GLUCOSE: 139 mg/dL — AB (ref 70–99)
Potassium: 4.2 mEq/L (ref 3.7–5.3)
Sodium: 136 mEq/L — ABNORMAL LOW (ref 137–147)

## 2013-04-29 LAB — CBC
HCT: 30.2 % — ABNORMAL LOW (ref 39.0–52.0)
Hemoglobin: 10.1 g/dL — ABNORMAL LOW (ref 13.0–17.0)
MCH: 28.9 pg (ref 26.0–34.0)
MCHC: 33.4 g/dL (ref 30.0–36.0)
MCV: 86.3 fL (ref 78.0–100.0)
PLATELETS: 408 10*3/uL — AB (ref 150–400)
RBC: 3.5 MIL/uL — ABNORMAL LOW (ref 4.22–5.81)
RDW: 17.1 % — AB (ref 11.5–15.5)
WBC: 13.7 10*3/uL — ABNORMAL HIGH (ref 4.0–10.5)

## 2013-04-29 LAB — TROPONIN I: Troponin I: <0.3 ng/mL (ref <0.30)

## 2013-04-29 NOTE — Progress Notes (Signed)
[address]       [place] [address]             [phone]        CARDIOTHORACIC SURGERY PROGRESS NOTE   R9 Days Post-Op Procedure(s) (LRB): OFF PUMP CORONARY ARTERY BYPASS GRAFTING (CABG) (N/A) INTRAOPERATIVE TRANSESOPHAGEAL ECHOCARDIOGRAM (N/A) LOBECTOMY  Subjective: No events overnight.  Feels okay.  No specific complaints  Objective: Vital signs: BP Readings from Last 1 Encounters:  04/29/13 95/60   Pulse Readings from Last 1 Encounters:  04/29/13 79   Resp Readings from Last 1 Encounters:  04/29/13 10   Temp Readings from Last 1 Encounters:  04/29/13 97.7 F (36.5 C) Oral    Hemodynamics:    Physical Exam:  Rhythm:   sinus  Breath sounds: clear  Heart sounds:  RRR  Incisions:  Clean and dry  Abdomen:  soft  Extremities:  Warm  Chest tube:  No air leak   Intake/Output from previous day: 02/06 0701 - 02/07 0700 In: 546.3 [P.O.:360; I.V.:186.3] Out: 1140 [Urine:1000; Chest Tube:140] Intake/Output this shift: Total I/O In: 180 [P.O.:180] Out: 0   Lab Results:  CBC: Recent Labs  04/27/13 0502 04/29/13 0824  WBC 19.7* 13.7*  HGB 9.3* 10.1*  HCT 27.7* 30.2*  PLT 308 408*    BMET:  Recent Labs  04/27/13 0502 04/28/13 0435 04/29/13 0824  NA 139  --  136*  K 4.3  --  4.2  CL 98  --  99  CO2 23  --  24  GLUCOSE 126*  --  139*  BUN 28*  --  21  CREATININE 0.96 0.79 0.77  CALCIUM 8.5  --  9.0     CBG (last 3)   Recent Labs  04/27/13 1130 04/27/13 1620 04/27/13 2050  GLUCAP 121* 119* 90   Results for AKIVA, BRASSFIELD (MRN [id number]) as of 04/29/2013 11:13  Ref. Range 04/28/2013 19:24 04/28/2013 21:07 04/29/2013 01:40 04/29/2013 06:37 04/29/2013 07:28 04/29/2013 08:24  Troponin I Latest Range: <0.30 ng/mL <0.30  <0.30   <0.30     ABG    Component Value Date/Time   PHART 7.384 04/20/2013 2014   PCO2ART 39.5 04/20/2013 2014   PO2ART 148.0* 04/20/2013 2014   HCO3 23.6 04/20/2013 2014   TCO2 27 04/21/2013 1700   ACIDBASEDEF 1.0 04/20/2013 2014   O2SAT 99.0 04/20/2013 2014    CXR: CLINICAL DATA: Status post coronary bypass grafting  EXAM:  PORTABLE CHEST - 1 VIEW  COMPARISON: 04/28/2013  FINDINGS:  Cardiac shadow is stable. A right-sided chest tube is again seen. No  definitive pneumothorax is noted at this time. Postsurgical changes  are again seen. The left lung is clear.  IMPRESSION:  No acute abnormality noted.  Electronically Signed  By: Inez Catalina M.D.  On: 04/29/2013 07:20  CT ANGIOGRAPHY CHEST WITH CONTRAST  TECHNIQUE:  Multidetector CT imaging of the chest was performed using the  standard protocol during bolus administration of intravenous  contrast. Multiplanar CT image reconstructions including MIPs were  obtained to evaluate the vascular anatomy.  CONTRAST: 113mL OMNIPAQUE IOHEXOL 350 MG/ML SOLN  COMPARISON: Portable chest radiographs 04/28/2013 and earlier.  PET-CT 03/14/2013. Chest CT 02/28/2013.  FINDINGS:  Good contrast bolus timing in the pulmonary arterial tree.  No focal filling defect identified in the pulmonary arterial tree to  suggest the presence of acute pulmonary embolism. Central pulmonary  arteries are enlarged.  Postoperative changes to the right chest with chest tube in place.  Trace pneumothorax. Right partial pneumonectomy, appears to be a  right upper lobectomy, with some postoperative rotation of the  residual right lung parenchyma. Major airways are patent. No right  pleural effusion. There is some confluent subpleural opacity along  the lateral lung, favored to be postoperative.  Apical emphysema and mild architectural distortion in the left lung.  Mild dependent atelectasis. No left pleural effusion.  Sequelae of median sternotomy. Small volume gas and fluid collection  in the anterior mediastinum, appears to be postoperative. Extensive  coronary artery calcified atherosclerosis. No pericardial effusion.  No definite mediastinal lymphadenopathy.  Early contrast  opacification of the aorta. Extensive atherosclerosis. No acute  thoracic aortic finding.  Negative thoracic inlet. Stable visualized upper abdominal viscera  including postoperative changes to the gastric cardia and cystic  renal disease.  Stable thoracic spine, T7 compression fracture is chronic. Recent  median sternotomy changes. No unexpected or suspicious osseous  lesion identified.  Review of the MIP images confirms the above findings.  IMPRESSION:  1. No evidence of acute pulmonary embolus.  2. Postoperative changes to the right lung and anterior mediastinum.  Right chest tube in place with trace pneumothorax. Small volume gas  and fluid in the anterior mediastinum underlying the sternotomy,  most likely postoperative.  3. No other acute findings in the chest.  Electronically Signed  By: Lars Pinks M.D.  On: 04/28/2013 21:20   ECG:   NSR w/out ischemic changes   Assessment/Plan: S/P Procedure(s) (LRB): OFF PUMP CORONARY ARTERY BYPASS GRAFTING (CABG) (N/A) INTRAOPERATIVE TRANSESOPHAGEAL ECHOCARDIOGRAM (N/A) LOBECTOMY  Overall stable  Events of yesterday evening still somewhat unclear, but no PE and no signs of cardiac event - ? Vasovagal event  Replace chest tube to H20 seal Mobilize Monitor in SICU for now    [facility] H 04/29/2013 11:12 AM

## 2013-04-29 NOTE — Progress Notes (Signed)
[facility] BRIEF SICU PROGRESS NOTE  9 Days Post-Op  S/P Procedure(s) (LRB): OFF PUMP CORONARY ARTERY BYPASS GRAFTING (CABG) (N/A) INTRAOPERATIVE TRANSESOPHAGEAL ECHOCARDIOGRAM (N/A) LOBECTOMY   Stable day  Plan: Continue current plan  Rexene Alberts 04/29/2013 5:36 PM

## 2013-04-30 ENCOUNTER — Inpatient Hospital Stay (HOSPITAL_COMMUNITY): Payer: Medicare Other

## 2013-04-30 LAB — CBC
HEMATOCRIT: 28.7 % — AB (ref 39.0–52.0)
Hemoglobin: 9.5 g/dL — ABNORMAL LOW (ref 13.0–17.0)
MCH: 28.3 pg (ref 26.0–34.0)
MCHC: 33.1 g/dL (ref 30.0–36.0)
MCV: 85.4 fL (ref 78.0–100.0)
Platelets: 423 10*3/uL — ABNORMAL HIGH (ref 150–400)
RBC: 3.36 MIL/uL — ABNORMAL LOW (ref 4.22–5.81)
RDW: 16.7 % — ABNORMAL HIGH (ref 11.5–15.5)
WBC: 14.2 10*3/uL — ABNORMAL HIGH (ref 4.0–10.5)

## 2013-04-30 LAB — BASIC METABOLIC PANEL
BUN: 19 mg/dL (ref 6–23)
CO2: 25 mEq/L (ref 19–32)
Calcium: 8.5 mg/dL (ref 8.4–10.5)
Chloride: 98 mEq/L (ref 96–112)
Creatinine, Ser: 0.78 mg/dL (ref 0.50–1.35)
GFR calc Af Amer: >90 mL/min (ref >90)
GFR calc non Af Amer: 89 mL/min — ABNORMAL LOW (ref >90)
GLUCOSE: 116 mg/dL — AB (ref 70–99)
POTASSIUM: 3.9 meq/L (ref 3.7–5.3)
Sodium: 137 mEq/L (ref 137–147)

## 2013-04-30 NOTE — Progress Notes (Signed)
      [address]       [place] [address]             [phone]        CARDIOTHORACIC SURGERY PROGRESS NOTE   R10 Days Post-Op Procedure(s) (LRB): OFF PUMP CORONARY ARTERY BYPASS GRAFTING (CABG) (N/A) INTRAOPERATIVE TRANSESOPHAGEAL ECHOCARDIOGRAM (N/A) LOBECTOMY  Subjective: Feels well.  No complaints other than mildly upset stomach.    Objective: Vital signs: BP Readings from Last 1 Encounters:  04/30/13 103/66   Pulse Readings from Last 1 Encounters:  04/30/13 100   Resp Readings from Last 1 Encounters:  04/30/13 23   Temp Readings from Last 1 Encounters:  04/30/13 97.6 F (36.4 C) Oral    Hemodynamics:    Physical Exam:  Rhythm:   sinus  Breath sounds: clear  Heart sounds:  RRR  Incisions:  Clean and dry  Abdomen:  Soft, non tender  Extremities:  Warm, well perfused  Chest tube:  No air leak, trivial output   Intake/Output from previous day: 02/07 0701 - 02/08 0700 In: 830 [P.O.:830] Out: 952 [Urine:951; Stool:1] Intake/Output this shift: Total I/O In: 360 [P.O.:360] Out: 225 [Urine:175; Chest Tube:50]  Lab Results:  CBC: Recent Labs  04/29/13 0824 04/30/13 0258  WBC 13.7* 14.2*  HGB 10.1* 9.5*  HCT 30.2* 28.7*  PLT 408* 423*    BMET:  Recent Labs  04/29/13 0824 04/30/13 0258  NA 136* 137  K 4.2 3.9  CL 99 98  CO2 24 25  GLUCOSE 139* 116*  BUN 21 19  CREATININE 0.77 0.78  CALCIUM 9.0 8.5     CBG (last 3)   Recent Labs  04/27/13 1130 04/27/13 1620 04/27/13 2050  GLUCAP 121* 119* 90    ABG    Component Value Date/Time   PHART 7.384 04/20/2013 2014   PCO2ART 39.5 04/20/2013 2014   PO2ART 148.0* 04/20/2013 2014   HCO3 23.6 04/20/2013 2014   TCO2 27 04/21/2013 1700   ACIDBASEDEF 1.0 04/20/2013 2014   O2SAT 99.0 04/20/2013 2014    CXR: CLINICAL DATA: Right chest tube and evaluate for pneumothorax.  EXAM:  PORTABLE CHEST - 1 VIEW  COMPARISON: 04/29/2013  FINDINGS:  Right chest tube is in a stable  position. There is lucency around  the chest tube and concern for a new right apical pneumothorax. Few  right basilar densities are suggestive for atelectasis. Heart size  is stable. Stable volume loss in the right hemithorax. Left lung  remains clear without focal disease or pneumothorax.  IMPRESSION:  There is concern for a right apical pneumothorax which is poorly  defined. The right chest tube is in a stable position.  Electronically Signed  By: Markus Daft M.D.  On: 04/30/2013 07:51   Assessment/Plan: S/P Procedure(s) (LRB): OFF PUMP CORONARY ARTERY BYPASS GRAFTING (CABG) (N/A) INTRAOPERATIVE TRANSESOPHAGEAL ECHOCARDIOGRAM (N/A) LOBECTOMY  Remains stable Air leak appears to have resolved Expected small right apical space post RULobectomy Events of Friday evening presumably vaso-vagal event Will transfer back to 2W Possibly d/c chest tube 1-2 days  Mobilize  OWEN,CLARENCE H 04/30/2013 11:03 AM

## 2013-05-01 ENCOUNTER — Inpatient Hospital Stay (HOSPITAL_COMMUNITY): Payer: Medicare Other

## 2013-05-01 DIAGNOSIS — R079 Chest pain, unspecified: Secondary | ICD-10-CM

## 2013-05-01 DIAGNOSIS — E785 Hyperlipidemia, unspecified: Secondary | ICD-10-CM

## 2013-05-01 DIAGNOSIS — J9383 Other pneumothorax: Secondary | ICD-10-CM

## 2013-05-01 DIAGNOSIS — J939 Pneumothorax, unspecified: Secondary | ICD-10-CM | POA: Diagnosis not present

## 2013-05-01 DIAGNOSIS — I10 Essential (primary) hypertension: Secondary | ICD-10-CM

## 2013-05-01 DIAGNOSIS — R911 Solitary pulmonary nodule: Secondary | ICD-10-CM

## 2013-05-01 DIAGNOSIS — Z951 Presence of aortocoronary bypass graft: Secondary | ICD-10-CM

## 2013-05-01 LAB — CBC
HEMATOCRIT: 26.1 % — AB (ref 39.0–52.0)
Hemoglobin: 8.8 g/dL — ABNORMAL LOW (ref 13.0–17.0)
MCH: 28.8 pg (ref 26.0–34.0)
MCHC: 33.7 g/dL (ref 30.0–36.0)
MCV: 85.3 fL (ref 78.0–100.0)
Platelets: 472 10*3/uL — ABNORMAL HIGH (ref 150–400)
RBC: 3.06 MIL/uL — ABNORMAL LOW (ref 4.22–5.81)
RDW: 16.9 % — AB (ref 11.5–15.5)
WBC: 17.9 10*3/uL — ABNORMAL HIGH (ref 4.0–10.5)

## 2013-05-01 LAB — BASIC METABOLIC PANEL
BUN: 14 mg/dL (ref 6–23)
CHLORIDE: 102 meq/L (ref 96–112)
CO2: 25 meq/L (ref 19–32)
CREATININE: 0.87 mg/dL (ref 0.50–1.35)
Calcium: 8.7 mg/dL (ref 8.4–10.5)
GFR calc Af Amer: >90 mL/min (ref >90)
GFR calc non Af Amer: 85 mL/min — ABNORMAL LOW (ref >90)
Glucose, Bld: 115 mg/dL — ABNORMAL HIGH (ref 70–99)
Potassium: 4.8 mEq/L (ref 3.7–5.3)
Sodium: 139 mEq/L (ref 137–147)

## 2013-05-01 MED ORDER — BOOST / RESOURCE BREEZE PO LIQD
1.0000 | Freq: Two times a day (BID) | ORAL | Status: DC
  Administered 2013-05-01 – 2013-05-05 (×8): 1 via ORAL

## 2013-05-01 NOTE — Progress Notes (Addendum)
[address]       [place] [address]             [phone]      11 Days Post-Op  Procedure(s) (LRB): OFF PUMP CORONARY ARTERY BYPASS GRAFTING (CABG) (N/A) INTRAOPERATIVE TRANSESOPHAGEAL ECHOCARDIOGRAM (N/A) LOBECTOMY Subjective: Feels ok, some abdominal cramping  Objective  Telemetry sinus rhythm  Temp:  [97.8 F (36.6 C)-100.5 F (38.1 C)] 99.5 F (37.5 C) (02/09 0515) Pulse Rate:  [87-93] 93 (02/09 0804) Resp:  [18-19] 18 (02/09 0515) BP: (98-111)/(58-75) 102/58 mmHg (02/09 0804) SpO2:  [93 %-98 %] 94 % (02/09 0804) Weight:  [156 lb 8.4 oz (71 kg)] 156 lb 8.4 oz (71 kg) (02/09 0438)   Intake/Output Summary (Last 24 hours) at 05/01/13 0922 Last data filed at 05/01/13 0823  Gross per 24 hour  Intake    480 ml  Output    785 ml  Net   -305 ml       General appearance: alert, cooperative and no distress Heart: regular rate and rhythm Lungs: dim in right base Abdomen: + distension, + BS, non-tender Extremities: no edema Wound: incis healing well  Lab Results:  Recent Labs  04/30/13 0258 05/01/13 0525  NA 137 139  K 3.9 4.8  CL 98 102  CO2 25 25  GLUCOSE 116* 115*  BUN 19 14  CREATININE 0.78 0.87  CALCIUM 8.5 8.7   No results found for this basename: AST, ALT, ALKPHOS, BILITOT, PROT, ALBUMIN,  in the last 72 hours No results found for this basename: LIPASE, AMYLASE,  in the last 72 hours  Recent Labs  04/30/13 0258 05/01/13 0525  WBC 14.2* 17.9*  HGB 9.5* 8.8*  HCT 28.7* 26.1*  MCV 85.4 85.3  PLT 423* 472*    Recent Labs  04/28/13 1924 04/29/13 0140 04/29/13 0824  TROPONINI <0.30 <0.30 <0.30   No components found with this basename: POCBNP,  No results found for this basename: DDIMER,  in the last 72 hours No results found for this basename: HGBA1C,  in the last 72 hours No results found for this basename: CHOL, HDL, LDLCALC, TRIG, CHOLHDL,  in the last 72 hours No results found for this basename: TSH,  T4TOTAL, FREET3, T3FREE, THYROIDAB,  in the last 72 hours No results found for this basename: VITAMINB12, FOLATE, FERRITIN, TIBC, IRON, RETICCTPCT,  in the last 72 hours  Medications: Scheduled . aspirin EC  325 mg Oral Daily  . bisacodyl  10 mg Oral Daily   Or  . bisacodyl  10 mg Rectal Daily  . carvedilol  6.25 mg Oral BID WC  . docusate sodium  200 mg Oral Daily  . enoxaparin (LOVENOX) injection  40 mg Subcutaneous QHS  . feeding supplement (ENSURE COMPLETE)  237 mL Oral TID BM  . levofloxacin  500 mg Oral Daily  . montelukast  10 mg Oral QHS  . pantoprazole  40 mg Oral Daily     Radiology/Studies:  Dg Chest 2 View  05/01/2013   CLINICAL DATA:  Shortness of breath.  EXAM: CHEST  2 VIEW  COMPARISON:  04/30/2013.  FINDINGS: Interval enlargement of the right-sided pneumothorax now estimated at 40%. The chest tube is stable. The left lung remains clear.  IMPRESSION: Interval worsening of right-sided pneumothorax now estimated at 40%.  These results will be called to the ordering clinician or representative by the Radiologist Assistant, and communication documented in the PACS Dashboard.   Electronically Signed  By: Kalman Jewels M.D.   On: 05/01/2013 07:41   Dg Chest Port 1 View  04/30/2013   CLINICAL DATA:  Right chest tube and evaluate for pneumothorax.  EXAM: PORTABLE CHEST - 1 VIEW  COMPARISON:  04/29/2013  FINDINGS: Right chest tube is in a stable position. There is lucency around the chest tube and concern for a new right apical pneumothorax. Few right basilar densities are suggestive for atelectasis. Heart size is stable. Stable volume loss in the right hemithorax. Left lung remains clear without focal disease or pneumothorax.  IMPRESSION: There is concern for a right apical pneumothorax which is poorly defined. The right chest tube is in a stable position.   Electronically Signed   By: Markus Daft M.D.   On: 04/30/2013 07:51    INR: Will add last result for INR, ABG once components  are confirmed Will add last 4 CBG results once components are confirmed  Assessment/Plan: S/P Procedure(s) (LRB): OFF PUMP CORONARY ARTERY BYPASS GRAFTING (CABG) (N/A) INTRAOPERATIVE TRANSESOPHAGEAL ECHOCARDIOGRAM (N/A) LOBECTOMY  1 pntx/space has increased, Large air leak- cont at H2O seal 2 mod abd distension- + BS, + Flatus/BM- monitor clinically 3 some increase in leukocytosis- on levofloxin     LOS: 11 days    GOLD,WAYNE E 2/9/20159:22 AM  Patient seen and examined, agree with above

## 2013-05-01 NOTE — Progress Notes (Signed)
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  Patient reports his appetite is not good (worse then when he was initially admitted); PO intake variable at 10-100% per flowsheet records; noted Rapid Responsive Event 2/6 for decreased sats; c/o of some abdominal cramping; hasn't been doing well with his Ensure as it's been upsetting his stomach; tried Resource Breeze over weekend and tolerates this better; RD to adjust supplement orders.  Height: Ht Readings from Last 1 Encounters:  04/22/13 6' (1.829 m)    Weight: Wt Readings from Last 1 Encounters:  05/01/13 156 lb 8.4 oz (71 kg)    Re-estimated needs: Kcal: 1900-2100 Protein: 85-100 g Fluid: 1.9-2.1 L  Skin: surgical chest incision   Diet Order: Cardiac   Intake/Output Summary (Last 24 hours) at 05/01/13 1056 Last data filed at 05/01/13 1025  Gross per 24 hour  Intake    480 ml  Output    760 ml  Net   -280 ml    Labs:   Recent Labs Lab 04/29/13 0824 04/30/13 0258 05/01/13 0525  NA 136* 137 139  K 4.2 3.9 4.8  CL 99 98 102  CO2 24 25 25   BUN 21 19 14   CREATININE 0.77 0.78 0.87  CALCIUM 9.0 8.5 8.7  GLUCOSE 139* 116* 115*     Scheduled Meds: . aspirin EC  325 mg Oral Daily  . bisacodyl  10 mg Oral Daily   Or  . bisacodyl  10 mg Rectal Daily  . carvedilol  6.25 mg Oral BID WC  .  docusate sodium  200 mg Oral Daily  . enoxaparin (LOVENOX) injection  40 mg Subcutaneous QHS  . feeding supplement (ENSURE COMPLETE)  237 mL Oral TID BM  . levofloxacin  500 mg Oral Daily  . montelukast  10 mg Oral QHS  . pantoprazole  40 mg Oral Daily    Continuous Infusions: . sodium chloride 250 mL (04/29/13 0600)    Past Medical History  Diagnosis Date  . CAD (coronary artery disease)   . HTN (hypertension)   . COPD (chronic obstructive pulmonary disease)   . Hyperlipidemia   . Depression   . Vitamin D deficiency   . Sleep apnea   . Anginal pain   . Shortness of breath   . Arthritis   . Polycystic kidney disease     Past Surgical History  Procedure Laterality Date  . Cholecystectomy    . Hernia repair      x 11  . Angioplasty    . Cardiac catheterization      multiple  . Carpal tunnel release Right   . Spinal cord stimulator implant    . Back surgery    . Vagotomy    . Appendectomy    . Gastrectomy    . Nissen fundoplication    . Coronary artery bypass graft N/A 04/20/2013    Procedure:  OFF PUMP CORONARY ARTERY BYPASS GRAFTING (CABG);  Surgeon: Melrose Nakayama, MD;  Location: [place];  Service: Open Heart Surgery;  Laterality: N/A;  CABG X 1  POSSIBLE OFF PUMP  . Intraoperative transesophageal echocardiogram N/A 04/20/2013    Procedure: INTRAOPERATIVE TRANSESOPHAGEAL ECHOCARDIOGRAM;  Surgeon: Melrose Nakayama, MD;  Location: [place];  Service: Open Heart Surgery;  Laterality: N/A;  . Lobectomy  04/20/2013    Procedure: LOBECTOMY;  Surgeon: Melrose Nakayama, MD;  Location: [place];  Service: Open Heart Surgery;;  Right Upper Lobectomy    Arthur Holms, RD, LDN Pager #: [phone] After-Hours Pager #: [phone]

## 2013-05-01 NOTE — Progress Notes (Signed)
Subjective:  Up in chair. No SOB, chest tube on Rt  Objective:  Vital Signs in the last 24 hours: Temp:  [97.8 F (36.6 C)-100.5 F (38.1 C)] 99.5 F (37.5 C) (02/09 0515) Pulse Rate:  [87-93] 93 (02/09 0804) Resp:  [18-19] 18 (02/09 0515) BP: (98-111)/(58-75) 102/58 mmHg (02/09 0804) SpO2:  [93 %-98 %] 94 % (02/09 0804) Weight:  [156 lb 8.4 oz (71 kg)] 156 lb 8.4 oz (71 kg) (02/09 0438)  Intake/Output from previous day:  Intake/Output Summary (Last 24 hours) at 05/01/13 0835 Last data filed at 05/01/13 0800  Gross per 24 hour  Intake    240 ml  Output    585 ml  Net   -345 ml    Physical Exam: General appearance: alert, cooperative and no distress Lungs: clear to auscultation bilaterally and Chest tube on Rt Heart: regular rate and rhythm   Rate: 90  Rhythm: normal sinus rhythm  Lab Results:  Recent Labs  04/30/13 0258 05/01/13 0525  WBC 14.2* 17.9*  HGB 9.5* 8.8*  PLT 423* 472*    Recent Labs  04/30/13 0258 05/01/13 0525  NA 137 139  K 3.9 4.8  CL 98 102  CO2 25 25  GLUCOSE 116* 115*  BUN 19 14  CREATININE 0.78 0.87    Recent Labs  04/29/13 0140 04/29/13 0824  TROPONINI <0.30 <0.30   No results found for this basename: INR,  in the last 72 hours  Imaging: Dg Chest 2 View  05/01/2013   CLINICAL DATA:  Shortness of breath.  EXAM: CHEST  2 VIEW  COMPARISON:  04/30/2013.  FINDINGS: Interval enlargement of the right-sided pneumothorax now estimated at 40%. The chest tube is stable. The left lung remains clear.  IMPRESSION: Interval worsening of right-sided pneumothorax now estimated at 40%.  These results will be called to the ordering clinician or representative by the Radiologist Assistant, and communication documented in the PACS Dashboard.   Electronically Signed   By: Kalman Jewels M.D.   On: 05/01/2013 07:41   Cardiac Studies:  Assessment/Plan:   Active Problems:   Solitary pulmonary nodule- RUL lobectomy 04/20/13   S/P CABG x 1- SVG-RCA  04/20/13   Tachycardia- post op   Abnormal ECG- post op   Cyanosis- post op   Pneumothorax on right after surgery   HTN (hypertension)   Hyperlipidemia   Centrilobular emphysema   Protein-calorie malnutrition, severe    PLAN:  Rt pneumothorax post OP day # 10. He lives in [place] but will be here at least a month post-op, will arrange follow up in our office after discharge with Dr Johnsie Cancel. Consider adding a statin at discharge.  Kerin Ransom PA-C [facility] [phone] 05/01/2013, 8:35 AM   Agree with note written by Kerin Ransom [facility]  Cardiac stable s/p 1V CABG (RCA) and lobectomy for nodule. Looks good. NSR. Exam benign. Nothing active cardiac going on. ROV with Dr. Johnsie Cancel after DC.   Lorretta Harp 05/01/2013 9:38 AM

## 2013-05-01 NOTE — Progress Notes (Signed)
CARDIAC REHAB PHASE I   PRE:  Rate/Rhythm: 88 SR    BP: sitting 88/58    SaO2: 100 RA  MODE:  Ambulation: 350 ft   POST:  Rate/Rhythm: 108 ST with PVC    BP: sitting 102/60     SaO2:   Pt sts he feels very tired and washed out. Willing to walk. Slow, steady with RW. No overt SOB, sts he just feels "so tired". BP low but stable. Return to recliner, exhausted. I was surprised pt was willing to walk this far. HR up with walking. SaO2 never registers after walk. [phone]   Josephina Shih [place] CES, ACSM 05/01/2013 11:00 AM

## 2013-05-02 ENCOUNTER — Inpatient Hospital Stay (HOSPITAL_COMMUNITY): Payer: Medicare Other

## 2013-05-02 LAB — CULTURE, BLOOD (ROUTINE X 2)
CULTURE: NO GROWTH
Culture: NO GROWTH

## 2013-05-02 MED ORDER — CARVEDILOL 3.125 MG PO TABS
3.1250 mg | ORAL_TABLET | Freq: Two times a day (BID) | ORAL | Status: DC
  Filled 2013-05-02 (×5): qty 1

## 2013-05-02 NOTE — Progress Notes (Addendum)
      [address]       [place] [address]             [phone]        12 Days Post-Op Procedure(s) (LRB): OFF PUMP CORONARY ARTERY BYPASS GRAFTING (CABG) (N/A) INTRAOPERATIVE TRANSESOPHAGEAL ECHOCARDIOGRAM (N/A) LOBECTOMY  Subjective: Patient states less abdominal cramping since Ensure stopped.  Objective: Vital signs in last 24 hours: Temp:  [98.4 F (36.9 C)-100.8 F (38.2 C)] 100.8 F (38.2 C) (02/10 0427) Pulse Rate:  [87-95] 95 (02/10 0427) Cardiac Rhythm:  [-] Normal sinus rhythm (02/09 1945) Resp:  [18] 18 (02/10 0427) BP: (90-102)/(58-63) 101/59 mmHg (02/10 0427) SpO2:  [94 %-96 %] 95 % (02/10 0427) Weight:  [70.308 kg (155 lb)] 70.308 kg (155 lb) (02/10 0427)  Pre op weight  76 kg Current Weight  05/02/13 70.308 kg (155 lb)      Intake/Output from previous day: 02/09 0701 - 02/10 0700 In: 840 [P.O.:840] Out: 985 [Urine:975; Chest Tube:10]   Physical Exam:  Cardiovascular: RRR, no murmurs, gallops, or rubs. Pulmonary: Clear to auscultation bilaterally; no rales, wheezes, or rhonchi. Abdomen: Soft, non tender, bowel sounds present. Extremities: No lower extremity edema. Wounds: Clean and dry.  No erythema or signs of infection.  Lab Results: CBC:  Recent Labs  04/30/13 0258 05/01/13 0525  WBC 14.2* 17.9*  HGB 9.5* 8.8*  HCT 28.7* 26.1*  PLT 423* 472*   BMET:   Recent Labs  04/30/13 0258 05/01/13 0525  NA 137 139  K 3.9 4.8  CL 98 102  CO2 25 25  GLUCOSE 116* 115*  BUN 19 14  CREATININE 0.78 0.87  CALCIUM 8.5 8.7    PT/INR:  Lab Results  Component Value Date   INR 1.16 04/20/2013   INR 0.93 04/18/2013   INR 1.1* 02/24/2013   ABG:  INR: Will add last result for INR, ABG once components are confirmed Will add last 4 CBG results once components are confirmed  Assessment/Plan:  1. CV - SR/PVCs. On Coreg 6.25 bid 2.  Pulmonary - Chest tube has tidling with cough, but no obvious air leak with cough this  am.CXR yesterday showed a right pneumothorax.No CXR for today-will order.Encourage incentive spirometer. 3.  Acute blood loss anemia - H and H yesterday 8.8 and 26.1 4. Fever to 100.8. Leukocytosis-WBC yesterday up to 17,900. Being treated for UTI-on oral Levaquin. Re check CBC in am.  ZIMMERMAN,DONIELLE MPA-C 05/02/2013,7:21 AM  Patient seen and examined, agree with above No air leak this mori=ning and lung is nearly completely expanded on CXR- leave on water seal today- dc in Am if no leak

## 2013-05-02 NOTE — Progress Notes (Signed)
CARDIAC REHAB PHASE I   PRE:  Rate/Rhythm: 88 SR  BP:  Supine:   Sitting: 80/50  Standing:    SaO2: would not register  MODE:  Ambulation: 350 ft   POST:  Rate/Rhythm: 95 SR  BP:  Supine:   Sitting: 84/52  Standing:    SaO2: would not register 1035-1110 Assisted X 1 and used walker to ambulate. Gait steady with walker. Pt tires with walking and took several standing rest stops during walk. Pt exhausted by end of walk. Pt back to recliner after walk with call light in reach and family present.  Rodney Langton RN 05/02/2013 11:09 AM

## 2013-05-03 ENCOUNTER — Inpatient Hospital Stay (HOSPITAL_COMMUNITY): Payer: Medicare Other

## 2013-05-03 LAB — CBC
HCT: 28.7 % — ABNORMAL LOW (ref 39.0–52.0)
Hemoglobin: 9.6 g/dL — ABNORMAL LOW (ref 13.0–17.0)
MCH: 28.7 pg (ref 26.0–34.0)
MCHC: 33.4 g/dL (ref 30.0–36.0)
MCV: 85.7 fL (ref 78.0–100.0)
Platelets: 700 10*3/uL — ABNORMAL HIGH (ref 150–400)
RBC: 3.35 MIL/uL — ABNORMAL LOW (ref 4.22–5.81)
RDW: 16.9 % — ABNORMAL HIGH (ref 11.5–15.5)
WBC: 14.8 10*3/uL — ABNORMAL HIGH (ref 4.0–10.5)

## 2013-05-03 MED ORDER — LACTULOSE 10 GM/15ML PO SOLN
20.0000 g | Freq: Once | ORAL | Status: AC
  Administered 2013-05-03: 20 g via ORAL
  Filled 2013-05-03: qty 30

## 2013-05-03 MED ORDER — FLEET ENEMA 7-19 GM/118ML RE ENEM
1.0000 | ENEMA | Freq: Every day | RECTAL | Status: DC | PRN
  Filled 2013-05-03: qty 1

## 2013-05-03 NOTE — Progress Notes (Signed)
The Chaplain offered emotional and spiritual support to the patient today. The patient was very reflective and quiet during his visit with the [place]. The patient informed the Chaplain that his brother had come by to visit with him earlier that day. The patient expressed to the Chaplain that he will be ok and is looking forward to going home so he can return back to a normal way of life again.The Chaplain prayed with the patient at end of the end of their time together.  Chaplain Clista Bernhardt Aniyha Tate

## 2013-05-03 NOTE — Progress Notes (Addendum)
      [address]       ,[place] [address]             [phone]        13 Days Post-Op Procedure(s) (LRB): OFF PUMP CORONARY ARTERY BYPASS GRAFTING (CABG) (N/A) INTRAOPERATIVE TRANSESOPHAGEAL ECHOCARDIOGRAM (N/A) LOBECTOMY  Subjective: Patient feels boated and constipated. Denies abdominal pain, nausea, or emesis.  Objective: Vital signs in last 24 hours: Temp:  [98.6 F (37 C)-100.2 F (37.9 C)] 100.2 F (37.9 C) (02/11 0513) Pulse Rate:  [92-98] 97 (02/11 0513) Cardiac Rhythm:  [-] Normal sinus rhythm (02/10 2000) Resp:  [16-20] 18 (02/11 0513) BP: (90-100)/(50-65) 97/62 mmHg (02/11 0513) SpO2:  [90 %-96 %] 90 % (02/11 0513) Weight:  [70.625 kg (155 lb 11.2 oz)] 70.625 kg (155 lb 11.2 oz) (02/11 0513)  Pre op weight  76 kg Current Weight  05/03/13 70.625 kg (155 lb 11.2 oz)      Intake/Output from previous day: 02/10 0701 - 02/11 0700 In: 600 [P.O.:600] Out: 875 [Urine:875]   Physical Exam:  Cardiovascular: RRR Pulmonary: Clear to auscultation bilaterally; no rales, wheezes, or rhonchi. Abdomen: Soft, non tender, distended, high pitched bowel sounds present. Extremities: No lower extremity edema. Wounds: Clean and dry.  No erythema or signs of infection.  Lab Results: CBC:  Recent Labs  05/01/13 0525 05/03/13 0800  WBC 17.9* 14.8*  HGB 8.8* 9.6*  HCT 26.1* 28.7*  PLT 472* 700*   BMET:   Recent Labs  05/01/13 0525  NA 139  K 4.8  CL 102  CO2 25  GLUCOSE 115*  BUN 14  CREATININE 0.87  CALCIUM 8.7    PT/INR:  Lab Results  Component Value Date   INR 1.16 04/20/2013   INR 0.93 04/18/2013   INR 1.1* 02/24/2013   ABG:  INR: Will add last result for INR, ABG once components are confirmed Will add last 4 CBG results once components are confirmed  Assessment/Plan:  1. CV - SR/PVCs. BP more labile so Coreg has been decreased to 3.125 bid. Will hold this am. 2.  Pulmonary - Chest tube has tidling with cough, but no  obvious air leak with cough this am.CXR appears to show  a stable,right pneumothorax. Will likely remove chest tube.Encourage incentive spirometer. 3.  Acute blood loss anemia - H and H today 9.6 and 28.7 4. Fever to 100.2 yesterday.Leukocytosis-WBC  down to 14,800. Being treated for UTI-on oral Levaquin. 5. LOC constipation  ZIMMERMAN,DONIELLE MPA-C 05/03/2013,8:54 AM  No air leak, unable to pull up CXR but no pneumo per Radiologist- dc CT

## 2013-05-03 NOTE — Progress Notes (Signed)
1330 Came to see pt at 1130 and he wanted chest tube out before walking. Returned and chest tube in . Pt asked that I find out when chest tube was to be removed. RN went in to remove.  Will wait for chest XRAY results before walking. Will follow up as time permits. Graylon Good RN BSN 05/03/2013 3:01 PM

## 2013-05-03 NOTE — Progress Notes (Signed)
Chest tube removed as ordered. Pt. Tolerated well. Chest tube sutures tied and vasolene dressing applied. Pt instructed to lay down for one hour after.

## 2013-05-04 ENCOUNTER — Inpatient Hospital Stay (HOSPITAL_COMMUNITY): Payer: Medicare Other

## 2013-05-04 MED ORDER — FLEET ENEMA 7-19 GM/118ML RE ENEM
1.0000 | ENEMA | Freq: Once | RECTAL | Status: AC
  Administered 2013-05-04: 1 via RECTAL
  Filled 2013-05-04: qty 1

## 2013-05-04 NOTE — Progress Notes (Signed)
CARDIAC REHAB PHASE I   PRE:  Rate/Rhythm: 87SR  BP:  Supine:   Sitting: 74/44 right arm, 74/42 left arm manually,   78/48 dinamapp  Standing:    SaO2: 9^%RA  MODE:  Ambulation: 0 ft  1125-1145 Went to walk with pt. BP low as documented above. Pt denied dizziness sitting in chair but stated he was weak feeling. Had a fleets enema recently. Notified pt's RN of low BP. Will let staff walk with pt later when BP better. Gave pt orange juice to drink. Will followup tomorrow.        Graylon Good, RN BSN  05/04/2013 11:41 AM

## 2013-05-04 NOTE — Progress Notes (Addendum)
      [address]       [place] [address]             [phone]        14 Days Post-Op Procedure(s) (LRB): OFF PUMP CORONARY ARTERY BYPASS GRAFTING (CABG) (N/A) INTRAOPERATIVE TRANSESOPHAGEAL ECHOCARDIOGRAM (N/A) LOBECTOMY  Subjective: Patient passing a lot of flatus but no bowel movement, despite laxatives. No abdominal pain, nausea, or emesis.  Objective: Vital signs in last 24 hours: Temp:  [99 F (37.2 C)-100 F (37.8 C)] 100 F (37.8 C) (02/12 0515) Pulse Rate:  [90-103] 90 (02/12 0515) Cardiac Rhythm:  [-] Normal sinus rhythm (02/11 1930) Resp:  [16-18] 16 (02/12 0515) BP: (91-98)/(53-62) 91/53 mmHg (02/12 0515) SpO2:  [90 %-97 %] 96 % (02/12 0515) Weight:  [70.171 kg (154 lb 11.2 oz)] 70.171 kg (154 lb 11.2 oz) (02/12 0515)  Pre op weight  76 kg Current Weight  05/04/13 70.171 kg (154 lb 11.2 oz)      Intake/Output from previous day: 02/11 0701 - 02/12 0700 In: 600 [P.O.:600] Out: 825 [Urine:825]   Physical Exam:  Cardiovascular: RRR Pulmonary: Clear to auscultation bilaterally; no rales, wheezes, or rhonchi. Abdomen: Soft, non tender, distended, high pitched bowel sounds present. Extremities: No lower extremity edema. Wounds: Clean and dry.  No erythema or signs of infection.  Lab Results: CBC:  Recent Labs  05/03/13 0800  WBC 14.8*  HGB 9.6*  HCT 28.7*  PLT 700*   BMET:  No results found for this basename: NA, K, CL, CO2, GLUCOSE, BUN, CREATININE, CALCIUM,  in the last 72 hours  PT/INR:  Lab Results  Component Value Date   INR 1.16 04/20/2013   INR 0.93 04/18/2013   INR 1.1* 02/24/2013   ABG:  INR: Will add last result for INR, ABG once components are confirmed Will add last 4 CBG results once components are confirmed  Assessment/Plan:  1. CV - SR. SBP continues to be in the 90's, but HR when ambulating is in the mid 100's. He is currently on 3.125 bid with parameters. 2.  Pulmonary - Chest tube removed  yesterday. CXR appears to show no pneumothorax.Encourage incentive spirometer. 3.  Acute blood loss anemia - H and H yesterday 9.6 and 28.7 4. Fever to 100 yesterday.Leukocytosis-WBC  down to 14,800. Being treated for UTI-on oral Levaquin (day 8). Will stop. 5. GI-likely ileus. No bowel movement despite oral laxatives. Will give Fleets. No nausea or emesis and eating breakfast this am. Will stop Oxy and give Ultram.Monitor.   ZIMMERMAN,DONIELLE MPA-C 05/04/2013,7:28 AM  Small bowel movement after Fleet enema Low grade temp last PM-no obvious source Possibly home tomorrow if no fever today

## 2013-05-04 NOTE — Progress Notes (Signed)
BP 78/48. Patient resting in chair. Denies dizziness, SOB, or chest pain. C/o incisional pain. Dr. Roxan Hockey updated; no new orders. Ok to give Ultram. Will continue to monitor.Cindee Salt

## 2013-05-05 LAB — CBC
HCT: 24.7 % — ABNORMAL LOW (ref 39.0–52.0)
HEMOGLOBIN: 8.1 g/dL — AB (ref 13.0–17.0)
MCH: 27.7 pg (ref 26.0–34.0)
MCHC: 32.8 g/dL (ref 30.0–36.0)
MCV: 84.6 fL (ref 78.0–100.0)
Platelets: 640 10*3/uL — ABNORMAL HIGH (ref 150–400)
RBC: 2.92 MIL/uL — ABNORMAL LOW (ref 4.22–5.81)
RDW: 16.8 % — ABNORMAL HIGH (ref 11.5–15.5)
WBC: 15.3 10*3/uL — ABNORMAL HIGH (ref 4.0–10.5)

## 2013-05-05 LAB — BASIC METABOLIC PANEL
BUN: 14 mg/dL (ref 6–23)
CALCIUM: 8.6 mg/dL (ref 8.4–10.5)
CO2: 24 meq/L (ref 19–32)
CREATININE: 0.78 mg/dL (ref 0.50–1.35)
Chloride: 97 mEq/L (ref 96–112)
GFR calc Af Amer: >90 mL/min (ref >90)
GFR calc non Af Amer: 89 mL/min — ABNORMAL LOW (ref >90)
GLUCOSE: 113 mg/dL — AB (ref 70–99)
Potassium: 4.1 mEq/L (ref 3.7–5.3)
Sodium: 136 mEq/L — ABNORMAL LOW (ref 137–147)

## 2013-05-05 MED ORDER — ATORVASTATIN CALCIUM 20 MG PO TABS: 20.0000 mg | ORAL_TABLET | Freq: Every day | ORAL | Status: DC

## 2013-05-05 NOTE — Progress Notes (Addendum)
      [address]       [place] [address]             [phone]      15 Days Post-Op Procedure(s) (LRB): OFF PUMP CORONARY ARTERY BYPASS GRAFTING (CABG) (N/A) INTRAOPERATIVE TRANSESOPHAGEAL ECHOCARDIOGRAM (N/A) LOBECTOMY  Subjective:  Troy [name] states he is doing okay.  He does complain of pain and states the Ultram is not providing any relief.  He was taken off Narcotics for possible Ileus.  The patient had a small BM yesterday after enema, however he believes there is no more food left in his stomach.  He denies nausea, vomiting.  He also denies dizziness at rest or standing. Objective: Vital signs in last 24 hours: Temp:  [97.6 F (36.4 C)-99.2 F (37.3 C)] 98.3 F (36.8 C) (02/13 0342) Pulse Rate:  [78-107] 84 (02/13 0342) Cardiac Rhythm:  [-] Normal sinus rhythm (02/12 2010) Resp:  [18] 18 (02/13 0342) BP: (78-98)/(48-60) 90/57 mmHg (02/13 0342) SpO2:  [96 %] 96 % (02/13 0342) Weight:  [152 lb 1.9 oz (69 kg)-155 lb (70.308 kg)] 152 lb 1.9 oz (69 kg) (02/13 0616)  Intake/Output from previous day: 02/12 0701 - 02/13 0700 In: 360 [P.O.:360] Out: 176 [Urine:175; Stool:1]  General appearance: alert, cooperative and no distress Heart: regular rate and rhythm Lungs: clear to auscultation bilaterally Abdomen: soft, non-tender; bowel sounds normal; no masses,  no organomegaly Extremities: extremities normal, atraumatic, no cyanosis or edema Wound: clean and dry  Lab Results:  Recent Labs  05/03/13 0800 05/05/13 0332  WBC 14.8* 15.3*  HGB 9.6* 8.1*  HCT 28.7* 24.7*  PLT 700* 640*   BMET:  Recent Labs  05/05/13 0332  NA 136*  K 4.1  CL 97  CO2 24  GLUCOSE 113*  BUN 14  CREATININE 0.78  CALCIUM 8.6    PT/INR: No results found for this basename: LABPROT, INR,  in the last 72 hours ABG    Component Value Date/Time   PHART 7.384 04/20/2013 2014   HCO3 23.6 04/20/2013 2014   TCO2 27 04/21/2013 1700   ACIDBASEDEF 1.0 04/20/2013 2014   O2SAT  99.0 04/20/2013 2014   CBG (last 3)  No results found for this basename: GLUCAP,  in the last 72 hours  Assessment/Plan: S/P Procedure(s) (LRB): 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 CV- NSR rate controlled, Hypotension patient asymptomatic- Beta blocker has been discontinued 2. Pulm- no acute issues, encouraged IS 3. Renal- creatinine WNL, weight is below baseline, no LE edema - no diuretic at this time 4. Expected Acute Blood Loss Anemia- Hgb 8.1, will start Iron supplementation 5. GI- good bowel sounds, BM yesterday, continue stool softners, may benefit from daily use of Miralax 6. ID- last fever 99.2 at 7 last night, leukocytosis is stable, Levaquin completed for UTI 7. Pain control- off narcotics for suspected Ileus, Ultram not helping, creatinine is okay, possibly add oral Toradol for discharge 8. Dispo- patient with hypotension, but appears medically stable at this time, will discuss discharge with staff   LOS: 15 days    Ellwood Handler 05/05/2013  Patient seen and examined, agree with above. Ambulated without difficulty, not dizzy or light headed. Home today

## 2013-05-12 ENCOUNTER — Encounter: Payer: Medicare Other | Admitting: Cardiovascular Disease

## 2013-05-14 ENCOUNTER — Encounter (HOSPITAL_COMMUNITY): Payer: Self-pay | Admitting: Emergency Medicine

## 2013-05-14 ENCOUNTER — Inpatient Hospital Stay (HOSPITAL_COMMUNITY): Payer: Medicare Other

## 2013-05-14 ENCOUNTER — Inpatient Hospital Stay (HOSPITAL_COMMUNITY)
Admission: EM | Admit: 2013-05-14 | Discharge: 2013-05-31 | DRG: 856 | Disposition: A | Discharge status: Home Health | Payer: Medicare Other | Attending: Thoracic Surgery (Cardiothoracic Vascular Surgery) | Admitting: Thoracic Surgery (Cardiothoracic Vascular Surgery)

## 2013-05-14 DIAGNOSIS — Z8249 Family history of ischemic heart disease and other diseases of the circulatory system: Secondary | ICD-10-CM | POA: Diagnosis not present

## 2013-05-14 DIAGNOSIS — I9589 Other hypotension: Secondary | ICD-10-CM | POA: Diagnosis not present

## 2013-05-14 DIAGNOSIS — A499 Bacterial infection, unspecified: Secondary | ICD-10-CM

## 2013-05-14 DIAGNOSIS — Z87891 Personal history of nicotine dependence: Secondary | ICD-10-CM

## 2013-05-14 DIAGNOSIS — E43 Unspecified severe protein-calorie malnutrition: Secondary | ICD-10-CM | POA: Diagnosis present

## 2013-05-14 DIAGNOSIS — T8140XA Infection following a procedure, unspecified, initial encounter: Secondary | ICD-10-CM | POA: Diagnosis present

## 2013-05-14 DIAGNOSIS — Z7982 Long term (current) use of aspirin: Secondary | ICD-10-CM

## 2013-05-14 DIAGNOSIS — D62 Acute posthemorrhagic anemia: Secondary | ICD-10-CM | POA: Diagnosis not present

## 2013-05-14 DIAGNOSIS — I2510 Atherosclerotic heart disease of native coronary artery without angina pectoris: Secondary | ICD-10-CM | POA: Diagnosis present

## 2013-05-14 DIAGNOSIS — E785 Hyperlipidemia, unspecified: Secondary | ICD-10-CM | POA: Diagnosis present

## 2013-05-14 DIAGNOSIS — Z800 Family history of malignant neoplasm of digestive organs: Secondary | ICD-10-CM | POA: Diagnosis not present

## 2013-05-14 DIAGNOSIS — B9689 Other specified bacterial agents as the cause of diseases classified elsewhere: Secondary | ICD-10-CM | POA: Diagnosis present

## 2013-05-14 DIAGNOSIS — I10 Essential (primary) hypertension: Secondary | ICD-10-CM | POA: Diagnosis present

## 2013-05-14 DIAGNOSIS — I959 Hypotension, unspecified: Secondary | ICD-10-CM | POA: Diagnosis not present

## 2013-05-14 DIAGNOSIS — M1710 Unilateral primary osteoarthritis, unspecified knee: Secondary | ICD-10-CM | POA: Diagnosis present

## 2013-05-14 DIAGNOSIS — Z888 Allergy status to other drugs, medicaments and biological substances status: Secondary | ICD-10-CM | POA: Diagnosis not present

## 2013-05-14 DIAGNOSIS — M25469 Effusion, unspecified knee: Secondary | ICD-10-CM | POA: Diagnosis not present

## 2013-05-14 DIAGNOSIS — R5381 Other malaise: Secondary | ICD-10-CM | POA: Diagnosis present

## 2013-05-14 DIAGNOSIS — I498 Other specified cardiac arrhythmias: Secondary | ICD-10-CM | POA: Diagnosis present

## 2013-05-14 DIAGNOSIS — F3289 Other specified depressive episodes: Secondary | ICD-10-CM | POA: Diagnosis present

## 2013-05-14 DIAGNOSIS — N170 Acute kidney failure with tubular necrosis: Secondary | ICD-10-CM | POA: Diagnosis not present

## 2013-05-14 DIAGNOSIS — Z951 Presence of aortocoronary bypass graft: Secondary | ICD-10-CM | POA: Diagnosis not present

## 2013-05-14 DIAGNOSIS — C3410 Malignant neoplasm of upper lobe, unspecified bronchus or lung: Secondary | ICD-10-CM | POA: Diagnosis present

## 2013-05-14 DIAGNOSIS — Q613 Polycystic kidney, unspecified: Secondary | ICD-10-CM | POA: Diagnosis not present

## 2013-05-14 DIAGNOSIS — IMO0002 Reserved for concepts with insufficient information to code with codable children: Secondary | ICD-10-CM

## 2013-05-14 DIAGNOSIS — B966 Bacteroides fragilis [B. fragilis] as the cause of diseases classified elsewhere: Secondary | ICD-10-CM | POA: Diagnosis present

## 2013-05-14 DIAGNOSIS — K5900 Constipation, unspecified: Secondary | ICD-10-CM | POA: Diagnosis not present

## 2013-05-14 DIAGNOSIS — Z85118 Personal history of other malignant neoplasm of bronchus and lung: Secondary | ICD-10-CM | POA: Diagnosis not present

## 2013-05-14 DIAGNOSIS — J449 Chronic obstructive pulmonary disease, unspecified: Secondary | ICD-10-CM | POA: Diagnosis present

## 2013-05-14 DIAGNOSIS — E559 Vitamin D deficiency, unspecified: Secondary | ICD-10-CM | POA: Diagnosis present

## 2013-05-14 DIAGNOSIS — F329 Major depressive disorder, single episode, unspecified: Secondary | ICD-10-CM | POA: Diagnosis present

## 2013-05-14 DIAGNOSIS — M129 Arthropathy, unspecified: Secondary | ICD-10-CM | POA: Diagnosis present

## 2013-05-14 DIAGNOSIS — T8149XA Infection following a procedure, other surgical site, initial encounter: Secondary | ICD-10-CM | POA: Diagnosis present

## 2013-05-14 DIAGNOSIS — D72829 Elevated white blood cell count, unspecified: Secondary | ICD-10-CM | POA: Diagnosis not present

## 2013-05-14 DIAGNOSIS — G4730 Sleep apnea, unspecified: Secondary | ICD-10-CM | POA: Diagnosis present

## 2013-05-14 DIAGNOSIS — M1990 Unspecified osteoarthritis, unspecified site: Secondary | ICD-10-CM | POA: Diagnosis present

## 2013-05-14 DIAGNOSIS — Z808 Family history of malignant neoplasm of other organs or systems: Secondary | ICD-10-CM | POA: Diagnosis not present

## 2013-05-14 DIAGNOSIS — Y832 Surgical operation with anastomosis, bypass or graft as the cause of abnormal reaction of the patient, or of later complication, without mention of misadventure at the time of the procedure: Secondary | ICD-10-CM | POA: Diagnosis present

## 2013-05-14 DIAGNOSIS — Z881 Allergy status to other antibiotic agents status: Secondary | ICD-10-CM

## 2013-05-14 DIAGNOSIS — Z801 Family history of malignant neoplasm of trachea, bronchus and lung: Secondary | ICD-10-CM

## 2013-05-14 DIAGNOSIS — J4489 Other specified chronic obstructive pulmonary disease: Secondary | ICD-10-CM | POA: Diagnosis present

## 2013-05-14 LAB — CBC WITH DIFFERENTIAL/PLATELET
BASOS ABS: 0.1 10*3/uL (ref 0.0–0.1)
Basophils Relative: 0 % (ref 0–1)
EOS PCT: 0 % (ref 0–5)
Eosinophils Absolute: 0.1 10*3/uL (ref 0.0–0.7)
HCT: 27.8 % — ABNORMAL LOW (ref 39.0–52.0)
Hemoglobin: 8.9 g/dL — ABNORMAL LOW (ref 13.0–17.0)
Lymphocytes Relative: 11 % — ABNORMAL LOW (ref 12–46)
Lymphs Abs: 2.3 10*3/uL (ref 0.7–4.0)
MCH: 27.6 pg (ref 26.0–34.0)
MCHC: 32 g/dL (ref 30.0–36.0)
MCV: 86.3 fL (ref 78.0–100.0)
Monocytes Absolute: 2.2 10*3/uL — ABNORMAL HIGH (ref 0.1–1.0)
Monocytes Relative: 10 % (ref 3–12)
Neutro Abs: 16.8 10*3/uL — ABNORMAL HIGH (ref 1.7–7.7)
Neutrophils Relative %: 78 % — ABNORMAL HIGH (ref 43–77)
PLATELETS: 580 10*3/uL — AB (ref 150–400)
RBC: 3.22 MIL/uL — ABNORMAL LOW (ref 4.22–5.81)
RDW: 17 % — AB (ref 11.5–15.5)
WBC: 21.4 10*3/uL — ABNORMAL HIGH (ref 4.0–10.5)

## 2013-05-14 LAB — BASIC METABOLIC PANEL
BUN: 16 mg/dL (ref 6–23)
BUN: 16 mg/dL (ref 6–23)
CALCIUM: 8.9 mg/dL (ref 8.4–10.5)
CALCIUM: 9 mg/dL (ref 8.4–10.5)
CO2: 17 mEq/L — ABNORMAL LOW (ref 19–32)
CO2: 24 mEq/L (ref 19–32)
Chloride: 94 mEq/L — ABNORMAL LOW (ref 96–112)
Chloride: 96 mEq/L (ref 96–112)
Creatinine, Ser: 0.65 mg/dL (ref 0.50–1.35)
Creatinine, Ser: 0.67 mg/dL (ref 0.50–1.35)
GFR calc Af Amer: >90 mL/min (ref >90)
GFR calc non Af Amer: >90 mL/min (ref >90)
GFR calc non Af Amer: >90 mL/min (ref >90)
GLUCOSE: 101 mg/dL — AB (ref 70–99)
Glucose, Bld: 117 mg/dL — ABNORMAL HIGH (ref 70–99)
Potassium: 5.1 mEq/L (ref 3.7–5.3)
Potassium: 4.7 mEq/L (ref 3.7–5.3)
SODIUM: 132 meq/L — AB (ref 137–147)
SODIUM: 134 meq/L — AB (ref 137–147)

## 2013-05-14 LAB — TYPE AND SCREEN
ABO/RH(D): O POS
Antibody Screen: NEGATIVE

## 2013-05-14 LAB — APTT: aPTT: 38 seconds — ABNORMAL HIGH (ref 24–37)

## 2013-05-14 LAB — PROTIME-INR
INR: 1.29 (ref 0.00–1.49)
Prothrombin Time: 15.8 seconds — ABNORMAL HIGH (ref 11.6–15.2)

## 2013-05-14 LAB — I-STAT CG4 LACTIC ACID, ED: LACTIC ACID, VENOUS: 1.63 mmol/L (ref 0.5–2.2)

## 2013-05-14 MED ORDER — HYDROCODONE-ACETAMINOPHEN 10-325 MG PO TABS
1.0000 | ORAL_TABLET | Freq: Four times a day (QID) | ORAL | Status: DC | PRN
  Administered 2013-05-19 – 2013-05-29 (×2): 1 via ORAL
  Filled 2013-05-14 (×2): qty 1

## 2013-05-14 MED ORDER — TRAMADOL HCL 50 MG PO TABS
100.0000 mg | ORAL_TABLET | Freq: Three times a day (TID) | ORAL | Status: DC | PRN
  Administered 2013-05-15 – 2013-05-19 (×7): 100 mg via ORAL
  Administered 2013-05-20: 50 mg via ORAL
  Administered 2013-05-20 – 2013-05-21 (×4): 100 mg via ORAL
  Filled 2013-05-14 (×12): qty 2

## 2013-05-14 MED ORDER — ALBUTEROL SULFATE (2.5 MG/3ML) 0.083% IN NEBU: 2.5000 mg | INHALATION_SOLUTION | Freq: Four times a day (QID) | RESPIRATORY_TRACT | Status: DC | PRN

## 2013-05-14 MED ORDER — VITAMIN E 180 MG (400 UNIT) PO CAPS
400.0000 [IU] | ORAL_CAPSULE | Freq: Every day | ORAL | Status: DC
  Administered 2013-05-15 – 2013-05-31 (×16): 400 [IU] via ORAL
  Filled 2013-05-14 (×17): qty 1

## 2013-05-14 MED ORDER — VANCOMYCIN HCL 10 G IV SOLR
1250.0000 mg | Freq: Two times a day (BID) | INTRAVENOUS | Status: DC
  Administered 2013-05-14 – 2013-05-16 (×4): 1250 mg via INTRAVENOUS
  Filled 2013-05-14 (×6): qty 1250

## 2013-05-14 MED ORDER — ACETAMINOPHEN 325 MG PO TABS: 650.0000 mg | ORAL_TABLET | Freq: Four times a day (QID) | ORAL | Status: DC | PRN

## 2013-05-14 MED ORDER — IBUPROFEN 200 MG PO TABS
200.0000 mg | ORAL_TABLET | Freq: Every day | ORAL | Status: DC | PRN
  Administered 2013-05-16: 200 mg via ORAL
  Filled 2013-05-14 (×2): qty 1

## 2013-05-14 MED ORDER — PANTOPRAZOLE SODIUM 40 MG PO TBEC
40.0000 mg | DELAYED_RELEASE_TABLET | Freq: Every day | ORAL | Status: DC
  Administered 2013-05-16 – 2013-05-30 (×14): 40 mg via ORAL
  Filled 2013-05-14 (×13): qty 1

## 2013-05-14 MED ORDER — OXYCODONE HCL 5 MG PO TABS
10.0000 mg | ORAL_TABLET | ORAL | Status: DC | PRN
  Administered 2013-05-14 – 2013-05-31 (×94): 10 mg via ORAL
  Filled 2013-05-14 (×94): qty 2

## 2013-05-14 MED ORDER — ZOLPIDEM TARTRATE 5 MG PO TABS: 5.0000 mg | ORAL_TABLET | Freq: Every evening | ORAL | Status: DC | PRN

## 2013-05-14 MED ORDER — ALBUTEROL SULFATE HFA 108 (90 BASE) MCG/ACT IN AERS: 1.0000 | INHALATION_SPRAY | Freq: Four times a day (QID) | RESPIRATORY_TRACT | Status: DC | PRN

## 2013-05-14 MED ORDER — MONTELUKAST SODIUM 10 MG PO TABS
10.0000 mg | ORAL_TABLET | Freq: Every day | ORAL | Status: DC
  Administered 2013-05-15 – 2013-05-31 (×16): 10 mg via ORAL
  Filled 2013-05-14 (×17): qty 1

## 2013-05-14 MED ORDER — ASPIRIN EC 325 MG PO TBEC
325.0000 mg | DELAYED_RELEASE_TABLET | Freq: Every day | ORAL | Status: DC | PRN
  Administered 2013-05-15: 325 mg via ORAL
  Filled 2013-05-14: qty 1

## 2013-05-14 MED ORDER — GUAIFENESIN ER 600 MG PO TB12
600.0000 mg | ORAL_TABLET | Freq: Two times a day (BID) | ORAL | Status: DC | PRN
  Filled 2013-05-14: qty 1

## 2013-05-14 MED ORDER — VITAMIN B-12 1000 MCG PO TABS
1000.0000 ug | ORAL_TABLET | Freq: Every day | ORAL | Status: DC
  Administered 2013-05-15 – 2013-05-31 (×16): 1000 ug via ORAL
  Filled 2013-05-14 (×17): qty 1

## 2013-05-14 MED ORDER — ALUM & MAG HYDROXIDE-SIMETH 200-200-20 MG/5ML PO SUSP: 30.0000 mL | Freq: Four times a day (QID) | ORAL | Status: DC | PRN

## 2013-05-14 MED ORDER — ENOXAPARIN SODIUM 40 MG/0.4ML ~~LOC~~ SOLN
40.0000 mg | SUBCUTANEOUS | Status: DC
  Administered 2013-05-14 – 2013-05-18 (×5): 40 mg via SUBCUTANEOUS
  Filled 2013-05-14 (×7): qty 0.4

## 2013-05-14 MED ORDER — DIAZEPAM 5 MG PO TABS
10.0000 mg | ORAL_TABLET | Freq: Two times a day (BID) | ORAL | Status: DC | PRN
  Administered 2013-05-16 – 2013-05-30 (×5): 10 mg via ORAL
  Filled 2013-05-14 (×5): qty 2

## 2013-05-14 MED ORDER — ATORVASTATIN CALCIUM 20 MG PO TABS
20.0000 mg | ORAL_TABLET | Freq: Every day | ORAL | Status: DC
  Administered 2013-05-14 – 2013-05-31 (×17): 20 mg via ORAL
  Filled 2013-05-14 (×18): qty 1

## 2013-05-14 NOTE — ED Notes (Signed)
Attempted to call report.  RN to call back from 2W.

## 2013-05-14 NOTE — ED Notes (Signed)
IV team at bedside 

## 2013-05-14 NOTE — ED Provider Notes (Signed)
CSN: [id number]     Arrival date & time 05/14/13  1527 History   First MD Initiated Contact with Patient 05/14/13 1532     No chief complaint on file.    (Consider location/radiation/quality/duration/timing/severity/associated sxs/prior Treatment) HPI  73 year old male who is status post off-pump coronary artery bypass graft for single-vessel disease, intraoperative trans-esophageal echocardiogram, and a right upper lobectomy performed by Dr. Princess Perna over a month ago who presents to ED for postop complication. Patient states he was admitted to the hospital on January 29, and was discharged on February 13 after 16 days hospital stay. Reports feeling feverish and having generalized chest pain since discharge. This morning he noticed increasing pain to the sternal region and when he looked down he notice redness along the surgical site. Subsequently he also notice pustular drainage coming from the surgical site which worries him and prompted him to contact his surgeon, Dr. Princess Perna, who request patient to come to the ER and call him as soon as he arrived. Aside from the wound infection patient has no other specific complaint. He did not notice any redness initially until today. No complaints of nausea vomiting and diarrhea, no trouble breathing, no recent trauma.    Past Medical History  Diagnosis Date  . CAD (coronary artery disease)   . HTN (hypertension)   . COPD (chronic obstructive pulmonary disease)   . Hyperlipidemia   . Depression   . Vitamin D deficiency   . Sleep apnea   . Anginal pain   . Shortness of breath   . Arthritis   . Polycystic kidney disease    Past Surgical History  Procedure Laterality Date  . Cholecystectomy    . Hernia repair      x 11  . Angioplasty    . Cardiac catheterization      multiple  . Carpal tunnel release Right   . Spinal cord stimulator implant    . Back surgery    . Vagotomy    . Appendectomy    . Gastrectomy    . Nissen  fundoplication    . Coronary artery bypass graft N/A 04/20/2013    Procedure: OFF PUMP CORONARY ARTERY BYPASS GRAFTING (CABG);  Surgeon: Melrose Nakayama, MD;  Location: [facility];  Service: Open Heart Surgery;  Laterality: N/A;  CABG X 1  POSSIBLE OFF PUMP  . Intraoperative transesophageal echocardiogram N/A 04/20/2013    Procedure: INTRAOPERATIVE TRANSESOPHAGEAL ECHOCARDIOGRAM;  Surgeon: Melrose Nakayama, MD;  Location: [facility];  Service: Open Heart Surgery;  Laterality: N/A;  . Lobectomy  04/20/2013    Procedure: LOBECTOMY;  Surgeon: Melrose Nakayama, MD;  Location: [facility] OR;  Service: Open Heart Surgery;;  Right Upper Lobectomy   Family History  Problem Relation Age of Onset  . Lung cancer Mother     worked at a [employer]  . Heart disease Mother   . Stomach cancer Maternal Grandmother   . Brain cancer Father    History  Substance Use Topics  . Smoking status: Former Smoker -- 1.00 packs/day for 25 years    Types: Cigarettes    Quit date: 03/23/2002  . Smokeless tobacco: Never Used  . Alcohol Use: No    Review of Systems  Constitutional: Positive for fever.  Respiratory: Negative for shortness of breath.   Cardiovascular: Positive for chest pain.  Skin: Positive for wound.      Allergies  Augmentin; Flagyl; and Tape  Home Medications   Current Outpatient Rx  Name  Route  Sig  Dispense  Refill  . albuterol (PROAIR HFA) 108 (90 BASE) MCG/ACT inhaler   Inhalation   Inhale 2 puffs into the lungs daily as needed for wheezing or shortness of breath.         Marland Kitchen aspirin 325 MG EC tablet   Oral   Take 325 mg by mouth daily as needed (angina).         Marland Kitchen atorvastatin (LIPITOR) 20 MG tablet   Oral   Take 1 tablet (20 mg total) by mouth daily.   30 tablet   3   . diazepam (VALIUM) 10 MG tablet   Oral   Take 10 mg by mouth 2 (two) times daily as needed for anxiety (for back spasms).         Marland Kitchen HYDROcodone-acetaminophen (NORCO) 10-325 MG per tablet   Oral    Take 1 tablet by mouth every 6 (six) hours as needed (pain). prn         . ibuprofen (ADVIL,MOTRIN) 200 MG tablet   Oral   Take 200 mg by mouth daily as needed (pain).         . montelukast (SINGULAIR) 10 MG tablet   Oral   Take 10 mg by mouth daily.          . traMADol (ULTRAM) 50 MG tablet   Oral   Take 100 mg by mouth 3 (three) times daily as needed (pain).          . vitamin B-12 (CYANOCOBALAMIN) 1000 MCG tablet   Oral   Take 1,000 mcg by mouth daily.         . vitamin E 400 UNIT capsule   Oral   Take 400 Units by mouth daily.         Marland Kitchen zolpidem (AMBIEN) 10 MG tablet   Oral   Take 10 mg by mouth at bedtime as needed for sleep.           BP 113/80  Pulse 94  Temp(Src) 98.6 F (37 C) (Oral)  Resp 20  SpO2 95% Physical Exam  Constitutional: He appears well-developed and well-nourished. No distress.  Chronically ill-appearing male appears in no acute distress  HENT:  Head: Atraumatic.  Mouth/Throat: Oropharynx is clear and moist.  Eyes: Conjunctivae are normal.  Neck: Normal range of motion. Neck supple.  Cardiovascular: Normal rate and regular rhythm.   Pulmonary/Chest: Effort normal and breath sounds normal. He exhibits tenderness (Midsternal surgical scar with  4 cm of erythema along surgical scar with pustular exudate, erythema, and moderate tenderness to palpation.).  Neurological: He is alert.  Skin: No rash noted.  Psychiatric: He has a normal mood and affect.    ED Course  Procedures (including critical care time)  3:59 PM Patient presents with post-op infection at the surgical site on his midsternal region.  Will obtain labs and consult Dr. Princess Perna. He is currently afebrile with stable normal vital signs.  4:24 PM I have consulted with Dr. Roxan Hockey who will see pt in ER for further care.  He recommend basic labs, CXR.  Care discussed with Dr. Wyvonnia Dusky.   5:17 PM Dr. Roxan Hockey has seen and evaluate pt and will admit for further  management.    Labs Review Labs Reviewed  CBC WITH DIFFERENTIAL - Abnormal; Notable for the following:    WBC 21.4 (*)    RBC 3.22 (*)    Hemoglobin 8.9 (*)    HCT 27.8 (*)    RDW 17.0 (*)  Platelets 580 (*)    Neutrophils Relative % 78 (*)    Neutro Abs 16.8 (*)    Lymphocytes Relative 11 (*)    Monocytes Absolute 2.2 (*)    All other components within normal limits  CULTURE, BLOOD (ROUTINE X 2)  CULTURE, BLOOD (ROUTINE X 2)  BASIC METABOLIC PANEL  I-STAT CG4 LACTIC ACID, ED   Imaging Review No results found.  EKG Interpretation    Date/Time:  Sunday May 14 2013 16:04:27 EST Ventricular Rate:  92 PR Interval:  177 QRS Duration: 95 QT Interval:  348 QTC Calculation: 430 R Axis:   61 Text Interpretation:  Sinus rhythm Atrial premature complex Minimal ST elevation, inferior leads Artifact No significant change was found Confirmed by Wyvonnia Dusky  MD, STEPHEN ([id number]) on 05/14/2013 4:15:02 PM            MDM   Final diagnoses:  Postoperative infection    BP 109/69  Pulse 95  Temp(Src) 98.6 F (37 C) (Oral)  Resp 18  SpO2 100%  I have reviewed nursing notes and vital signs. I personally reviewed the imaging tests through PACS system  I reviewed available ER/hospitalization records thought the EMR     Domenic Moras, [place] 05/14/13 1720

## 2013-05-14 NOTE — H&P (Signed)
Troy [name] is an 73 y.o. male.   Chief Complaint: abscess of sternal wound and fever  HPI: 73 yo male who underwent CABG x 1 and right upper lobectomy via a median sternotomy on 04/20/13. He has a history of CAD, COPD, lung cancer(T1b, N0- stage IA), HTN, hyperlipidemia, and malnutrition. He had a prolonged air leak after surgery and was finally discharged after that resolved on POD # 15. He also was treated empirically with levaquin for a possible UTI. He has been having fevers for the last 5 days or so. The highest was 101.2, most of the time it only went to about 100. He had not noted any increase in sternal pain. Today he noted this afternoon that there was redness and swelling and what appeared to be an abscess on the lower part of his sternal wound. This had come up since this morning. He called and was instructed to come to the ED. In the ED the wound spontaneously opened adn drained purulent material.  Past Medical History  Diagnosis Date  . CAD (coronary artery disease)   . HTN (hypertension)   . COPD (chronic obstructive pulmonary disease)   . Hyperlipidemia   . Depression   . Vitamin D deficiency   . Sleep apnea   . Anginal pain   . Shortness of breath   . Arthritis   . Polycystic kidney disease     Past Surgical History  Procedure Laterality Date  . Cholecystectomy    . Hernia repair      x 11  . Angioplasty    . Cardiac catheterization      multiple  . Carpal tunnel release Right   . Spinal cord stimulator implant    . Back surgery    . Vagotomy    . Appendectomy    . Gastrectomy    . Nissen fundoplication    . Coronary artery bypass graft N/A 04/20/2013    Procedure: OFF PUMP CORONARY ARTERY BYPASS GRAFTING (CABG);  Surgeon: Melrose Nakayama, MD;  Location: [facility] [place];  Service: Open Heart Surgery;  Laterality: N/A;  CABG X 1  POSSIBLE OFF PUMP  . Intraoperative transesophageal echocardiogram N/A 04/20/2013    Procedure: INTRAOPERATIVE TRANSESOPHAGEAL ECHOCARDIOGRAM;   Surgeon: Melrose Nakayama, MD;  Location: [place];  Service: Open Heart Surgery;  Laterality: N/A;  . Lobectomy  04/20/2013    Procedure: LOBECTOMY;  Surgeon: Melrose Nakayama, MD;  Location: [facility] OR;  Service: Open Heart Surgery;;  Right Upper Lobectomy    Family History  Problem Relation Age of Onset  . Lung cancer Mother     worked at a [employer]  . Heart disease Mother   . Stomach cancer Maternal Grandmother   . Brain cancer Father    Social History:  reports that he quit smoking about 11 years ago. His smoking use included Cigarettes. He has a 25 pack-year smoking history. He has never used smokeless tobacco. He reports that he does not drink alcohol or use illicit drugs.  Allergies:  Allergies  Allergen Reactions  . Augmentin [Amoxicillin-Pot Clavulanate] Other (See Comments)    "passed out" when taking augmentin and flagyl together  . Flagyl [Metronidazole] Other (See Comments)    "passed out" when taking augmentin and flagyl together  . Tape Other (See Comments)    Blisters (need to use paper tape)   Current Facility-Administered Medications on File Prior to Encounter  Medication Dose Route Frequency Provider Last Rate Last Dose  . chlorhexidine (HIBICLENS)  4 % liquid 2 application  30 mL Topical UD Melrose Nakayama, MD      . metoprolol tartrate (LOPRESSOR) tablet 12.5 mg  12.5 mg Oral Once Melrose Nakayama, MD       Current Outpatient Prescriptions on File Prior to Encounter  Medication Sig Dispense Refill  . albuterol (PROAIR HFA) 108 (90 BASE) MCG/ACT inhaler Inhale 1-2 puffs into the lungs every 6 (six) hours as needed for wheezing or shortness of breath.       Marland Kitchen aspirin 325 MG EC tablet Take 325 mg by mouth daily as needed (angina).      . diazepam (VALIUM) 10 MG tablet Take 10 mg by mouth 2 (two) times daily as needed for anxiety (for back spasms).      Marland Kitchen HYDROcodone-acetaminophen (NORCO) 10-325 MG per tablet Take 1 tablet by mouth every 6 (six) hours  as needed for moderate pain.       Marland Kitchen ibuprofen (ADVIL,MOTRIN) 200 MG tablet Take 200 mg by mouth daily as needed for mild pain.       . montelukast (SINGULAIR) 10 MG tablet Take 10 mg by mouth daily.       . traMADol (ULTRAM) 50 MG tablet Take 100 mg by mouth 3 (three) times daily as needed (pain).       . vitamin B-12 (CYANOCOBALAMIN) 1000 MCG tablet Take 1,000 mcg by mouth daily.      . vitamin E 400 UNIT capsule Take 400 Units by mouth daily.      Marland Kitchen zolpidem (AMBIEN) 10 MG tablet Take 10 mg by mouth at bedtime as needed for sleep.          Marland Kitchen  Results for orders placed during the hospital encounter of 05/14/13 (from the past 48 hour(s))  CBC WITH DIFFERENTIAL     Status: Abnormal   Collection Time    05/14/13  4:35 PM      Result Value Ref Range   WBC 21.4 (*) 4.0 - 10.5 K/uL   RBC 3.22 (*) 4.22 - 5.81 MIL/uL   Hemoglobin 8.9 (*) 13.0 - 17.0 g/dL   HCT 27.8 (*) 39.0 - 52.0 %   MCV 86.3  78.0 - 100.0 fL   MCH 27.6  26.0 - 34.0 pg   MCHC 32.0  30.0 - 36.0 g/dL   RDW 17.0 (*) 11.5 - 15.5 %   Platelets 580 (*) 150 - 400 K/uL   Neutrophils Relative % 78 (*) 43 - 77 %   Neutro Abs 16.8 (*) 1.7 - 7.7 K/uL   Lymphocytes Relative 11 (*) 12 - 46 %   Lymphs Abs 2.3  0.7 - 4.0 K/uL   Monocytes Relative 10  3 - 12 %   Monocytes Absolute 2.2 (*) 0.1 - 1.0 K/uL   Eosinophils Relative 0  0 - 5 %   Eosinophils Absolute 0.1  0.0 - 0.7 K/uL   Basophils Relative 0  0 - 1 %   Basophils Absolute 0.1  0.0 - 0.1 K/uL  I-STAT CG4 LACTIC ACID, ED     Status: None   Collection Time    05/14/13  4:49 PM      Result Value Ref Range   Lactic Acid, Venous 1.63  0.5 - 2.2 mmol/L   Dg Chest 2 View  05/14/2013   CLINICAL DATA:  Postop CABG and right upper lobectomy on 04/20/2013. Drainage at the sternal incision site.  EXAM: CHEST  2 VIEW  COMPARISON:  DG CHEST 2  VIEW dated 05/04/2013; DG CHEST 1V PORT dated 05/03/2013; DG CHEST 1V PORT dated 05/03/2013; DG CHEST 1V PORT dated 05/02/2013; DG CHEST 2 VIEW  dated 04/18/2013  FINDINGS: Sternotomy for CABG. Cardiac silhouette mildly enlarged but stable. Thoracic aorta tortuous and atherosclerotic, unchanged. Hilar and mediastinal contours otherwise unremarkable. Mild right apical pleuroparenchymal scarring related to the right upper lobectomy. New focal opacity anteriorly in the right upper lobe. Lungs otherwise clear. No localized airspace consolidation. No pleural effusions. No pneumothorax. Normal pulmonary vascularity.  IMPRESSION: 1. Atelectasis versus pneumonia involving the anterior left upper lobe. 2. Stable mild cardiomegaly without pulmonary edema. 3. Mild right apical pleuroparenchymal scarring post right upper lobectomy.   Electronically Signed   By: Evangeline Dakin M.D.   On: 05/14/2013 18:08    Review of Systems  Constitutional: Positive for fever and malaise/fatigue. Negative for chills and diaphoresis.  Respiratory: Negative for cough and wheezing.   Cardiovascular: Negative for chest pain.  Gastrointestinal: Positive for constipation.  Skin:       Abscess of sternal wound that spontaneously drained in ED  All other systems reviewed and are negative.    Blood pressure 107/72, pulse 89, temperature 98.6 F (37 C), temperature source Oral, resp. rate 16, SpO2 97.00%. Physical Exam  Vitals reviewed. Constitutional: He is oriented to person, place, and time. No distress.  thin  HENT:  Head: Normocephalic and atraumatic.  Eyes: EOM are normal. Pupils are equal, round, and reactive to light.  Neck: Neck supple. No thyromegaly present.  Cardiovascular: Normal rate, regular rhythm and normal heart sounds.  Exam reveals no friction rub.   No murmur heard. Respiratory: Effort normal and breath sounds normal.  Purulent drainage from sternal wound, sternum stable  GI: Soft.  Musculoskeletal: He exhibits no edema.  Lymphadenopathy:    He has no cervical adenopathy.  Neurological: He is alert and oriented to person, place, and time. No  cranial nerve deficit.  Skin: Skin is warm and dry.     CHEST 2 VIEW  COMPARISON: DG CHEST 2 VIEW dated 05/04/2013; DG CHEST 1V PORT dated  05/03/2013; DG CHEST 1V PORT dated 05/03/2013; DG CHEST 1V PORT dated  05/02/2013; DG CHEST 2 VIEW dated 04/18/2013  FINDINGS:  Sternotomy for CABG. Cardiac silhouette mildly enlarged but stable.  Thoracic aorta tortuous and atherosclerotic, unchanged. Hilar and  mediastinal contours otherwise unremarkable. Mild right apical  pleuroparenchymal scarring related to the right upper lobectomy. New  focal opacity anteriorly in the right upper lobe. Lungs otherwise  clear. No localized airspace consolidation. No pleural effusions. No  pneumothorax. Normal pulmonary vascularity.  IMPRESSION:  1. Atelectasis versus pneumonia involving the anterior left upper  lobe.  2. Stable mild cardiomegaly without pulmonary edema.  3. Mild right apical pleuroparenchymal scarring post right upper  lobectomy.    Assessment/Plan 73 yo man about a month post CABG and RUL via sternotomy. He presents with a sternal wound infection. I did a superficial exploration of the wound in the ED and there was a significant amount of purulent drainage. It was sent for culture. The wound was packed with gauze.  Start IV Vancomycin  Plan sternal exploration/ debridement, possible VAC placement in OR tomorrow  Taunia Frasco C 05/14/2013, 6:26 PM

## 2013-05-14 NOTE — ED Notes (Addendum)
Attempted IV access x 1 without success.  IV team paged. Phlebotomist at bedside attempting to obtain labs.

## 2013-05-14 NOTE — ED Notes (Signed)
I Stat Lactic acid results reported to Domenic Moras PA

## 2013-05-14 NOTE — Progress Notes (Addendum)
ANTIBIOTIC CONSULT NOTE - INITIAL  Pharmacy Consult for vancocycin Indication: Sternal wound infection  Allergies  Allergen Reactions  . Augmentin [Amoxicillin-Pot Clavulanate] Other (See Comments)    "passed out" when taking augmentin and flagyl together  . Flagyl [Metronidazole] Other (See Comments)    "passed out" when taking augmentin and flagyl together  . Tape Other (See Comments)    Blisters (need to use paper tape)    Patient Measurements: Height: 6' (182.9 cm) IBW/kg (Calculated) : 77.6 Adjusted Body Weight:   Vital Signs: Temp: 98.6 F (37 C) (02/22 1536) Temp src: Oral (02/22 1536) BP: 107/72 mmHg (02/22 1715) Pulse Rate: 89 (02/22 1700) Intake/Output from previous day:   Intake/Output from this shift:    Labs:  Recent Labs  05/14/13 1635  WBC 21.4*  HGB 8.9*  PLT 580*  CREATININE 0.65   The CrCl is unknown because both a height and weight (above a minimum accepted value) are required for this calculation. No results found for this basename: VANCOTROUGH, Corlis Leak, VANCORANDOM, GENTTROUGH, GENTPEAK, GENTRANDOM, TOBRATROUGH, TOBRAPEAK, TOBRARND, AMIKACINPEAK, AMIKACINTROU, AMIKACIN,  in the last 72 hours   Microbiology: Recent Results (from the past 720 hour(s))  SURGICAL PCR SCREEN     Status: None   Collection Time    04/18/13 12:46 PM      Result Value Ref Range Status   MRSA, PCR NEGATIVE  NEGATIVE Final   Staphylococcus aureus NEGATIVE  NEGATIVE Final   Comment:            The Xpert SA Assay (FDA     approved for NASAL specimens     in patients over 2 years of age),     is one component of     a comprehensive surveillance     program.  Test performance has     been validated by [employer] for patients greater     than or equal to 32 year old.     It is not intended     to diagnose infection nor to     guide or monitor treatment.  CULTURE, BLOOD (ROUTINE X 2)     Status: None   Collection Time    04/26/13  4:20 PM      Result  Value Ref Range Status   Specimen Description BLOOD LEFT ARM   Final   Special Requests BOTTLES DRAWN AEROBIC AND ANAEROBIC 10CC   Final   Culture  Setup Time     Final   Value: 04/26/2013 22:15     Performed at [facility]   Culture     Final   Value: NO GROWTH 5 DAYS     Performed at [facility]   Report Status 05/02/2013 FINAL   Final  CULTURE, BLOOD (ROUTINE X 2)     Status: None   Collection Time    04/26/13  4:32 PM      Result Value Ref Range Status   Specimen Description BLOOD RIGHT HAND   Final   Special Requests     Final   Value: BOTTLES DRAWN AEROBIC AND ANAEROBIC BLUE 10CC RED 5CC   Culture  Setup Time     Final   Value: 04/26/2013 22:15     Performed at [facility]   Culture     Final   Value: NO GROWTH 5 DAYS     Performed at [facility]   Report Status 05/02/2013 FINAL   Final  URINE  CULTURE     Status: None   Collection Time    04/26/13  8:12 PM      Result Value Ref Range Status   Specimen Description URINE, CLEAN CATCH   Final   Special Requests ADDED [id number] [phone]   Final   Culture  Setup Time     Final   Value: 04/27/2013 13:06     Performed at [facility]     Final   Value: 30,000 COLONIES/ML     Performed at [facility]   Culture     Final   Value: Multiple bacterial morphotypes present, none predominant. Suggest appropriate recollection if clinically indicated.     Performed at [facility]   Report Status 04/28/2013 FINAL   Final    Medical History: Past Medical History  Diagnosis Date  . CAD (coronary artery disease)   . HTN (hypertension)   . COPD (chronic obstructive pulmonary disease)   . Hyperlipidemia   . Depression   . Vitamin D deficiency   . Sleep apnea   . Anginal pain   . Shortness of breath   . Arthritis   . Polycystic kidney disease     Medications:  Prescriptions prior to admission  Medication Sig Dispense Refill  . albuterol (PROAIR HFA) 108  (90 BASE) MCG/ACT inhaler Inhale 1-2 puffs into the lungs every 6 (six) hours as needed for wheezing or shortness of breath.       Marland Kitchen aspirin 325 MG EC tablet Take 325 mg by mouth daily as needed (angina).      Marland Kitchen atorvastatin (LIPITOR) 20 MG tablet Take 20 mg by mouth daily.      . diazepam (VALIUM) 10 MG tablet Take 10 mg by mouth 2 (two) times daily as needed for anxiety (for back spasms).      Marland Kitchen guaiFENesin (MUCINEX) 600 MG 12 hr tablet Take 600 mg by mouth 2 (two) times daily as needed for cough or to loosen phlegm.      Marland Kitchen HYDROcodone-acetaminophen (NORCO) 10-325 MG per tablet Take 1 tablet by mouth every 6 (six) hours as needed for moderate pain.       Marland Kitchen ibuprofen (ADVIL,MOTRIN) 200 MG tablet Take 200 mg by mouth daily as needed for mild pain.       . montelukast (SINGULAIR) 10 MG tablet Take 10 mg by mouth daily.       . traMADol (ULTRAM) 50 MG tablet Take 100 mg by mouth 3 (three) times daily as needed (pain).       . vitamin B-12 (CYANOCOBALAMIN) 1000 MCG tablet Take 1,000 mcg by mouth daily.      . vitamin E 400 UNIT capsule Take 400 Units by mouth daily.      Marland Kitchen zolpidem (AMBIEN) 10 MG tablet Take 10 mg by mouth at bedtime as needed for sleep.        Scheduled:  . atorvastatin  20 mg Oral Daily  . enoxaparin (LOVENOX) injection  40 mg Subcutaneous Q24H  . [START ON 05/15/2013] montelukast  10 mg Oral Daily  . [START ON 05/15/2013] pantoprazole  40 mg Oral Q1200  . [START ON 05/15/2013] vitamin B-12  1,000 mcg Oral Daily  . [START ON 05/15/2013] vitamin E  400 Units Oral Daily   Assessment: 73 yo who recently underwent CABG. He is not back with likely sternal wound abscess. Vanc has been ordered to start empirically.   Goal of Therapy:  Vanc trough =  25-20  Plan:   Vanc 1.25g IV q12 Trough at steady state

## 2013-05-14 NOTE — ED Notes (Signed)
Lab remains at bedside

## 2013-05-14 NOTE — ED Notes (Addendum)
Pt reports he had CABG and lobectomy (R upper lobe) on 1/29.  Around 2pm today pt noticed wound to chest was red and swollen with yellowish-white drainage from surgical site.  Denies pain at present.

## 2013-05-15 ENCOUNTER — Encounter (HOSPITAL_COMMUNITY): Payer: Self-pay | Admitting: Certified Registered Nurse Anesthetist

## 2013-05-15 ENCOUNTER — Encounter (HOSPITAL_COMMUNITY)
Admission: EM | Disposition: A | Discharge status: Home Health | Payer: Self-pay | Source: Home / Self Care | Attending: Thoracic Surgery (Cardiothoracic Vascular Surgery)

## 2013-05-15 ENCOUNTER — Inpatient Hospital Stay (HOSPITAL_COMMUNITY): Payer: Medicare Other

## 2013-05-15 ENCOUNTER — Encounter (HOSPITAL_COMMUNITY): Payer: Medicare Other | Admitting: Certified Registered Nurse Anesthetist

## 2013-05-15 ENCOUNTER — Inpatient Hospital Stay (HOSPITAL_COMMUNITY): Payer: Medicare Other | Admitting: Certified Registered Nurse Anesthetist

## 2013-05-15 DIAGNOSIS — T8140XA Infection following a procedure, unspecified, initial encounter: Secondary | ICD-10-CM

## 2013-05-15 HISTORY — PX: STERNAL WOUND DEBRIDEMENT: SHX1058

## 2013-05-15 HISTORY — PX: APPLICATION OF WOUND VAC: SHX5189

## 2013-05-15 LAB — URINALYSIS, ROUTINE W REFLEX MICROSCOPIC
Glucose, UA: NEGATIVE mg/dL
Hgb urine dipstick: NEGATIVE
KETONES UR: 15 mg/dL — AB
Leukocytes, UA: NEGATIVE
Nitrite: NEGATIVE
Protein, ur: 30 mg/dL — AB
Specific Gravity, Urine: 1.036 — ABNORMAL HIGH (ref 1.005–1.030)
UROBILINOGEN UA: 1 mg/dL (ref 0.0–1.0)
pH: 5.5 (ref 5.0–8.0)

## 2013-05-15 LAB — URINE MICROSCOPIC-ADD ON

## 2013-05-15 SURGERY — DEBRIDEMENT, WOUND, STERNUM
Anesthesia: General | Site: Chest

## 2013-05-15 MED ORDER — LACTATED RINGERS IV SOLN: INTRAVENOUS | Status: DC

## 2013-05-15 MED ORDER — OXYCODONE HCL 5 MG PO TABS: 5.0000 mg | ORAL_TABLET | Freq: Once | ORAL | Status: DC | PRN

## 2013-05-15 MED ORDER — FENTANYL CITRATE 0.05 MG/ML IJ SOLN
INTRAMUSCULAR | Status: AC
  Filled 2013-05-15: qty 5

## 2013-05-15 MED ORDER — SODIUM CHLORIDE 0.9 % IR SOLN
Status: DC | PRN
  Administered 2013-05-15: 1000 mL

## 2013-05-15 MED ORDER — MIDAZOLAM HCL 2 MG/2ML IJ SOLN
INTRAMUSCULAR | Status: AC
  Filled 2013-05-15: qty 2

## 2013-05-15 MED ORDER — ROCURONIUM BROMIDE 50 MG/5ML IV SOLN
INTRAVENOUS | Status: AC
  Filled 2013-05-15: qty 1

## 2013-05-15 MED ORDER — FENTANYL CITRATE 0.05 MG/ML IJ SOLN: 50.0000 ug | Freq: Once | INTRAMUSCULAR | Status: DC

## 2013-05-15 MED ORDER — PROPOFOL 10 MG/ML IV BOLUS
INTRAVENOUS | Status: DC | PRN
  Administered 2013-05-15: 120 mg via INTRAVENOUS
  Administered 2013-05-15: 20 mg via INTRAVENOUS

## 2013-05-15 MED ORDER — FENTANYL CITRATE 0.05 MG/ML IJ SOLN
INTRAMUSCULAR | Status: DC | PRN
  Administered 2013-05-15: 50 ug via INTRAVENOUS
  Administered 2013-05-15: 100 ug via INTRAVENOUS
  Administered 2013-05-15: 50 ug via INTRAVENOUS
  Administered 2013-05-15 (×2): 100 ug via INTRAVENOUS
  Administered 2013-05-15: 50 ug via INTRAVENOUS
  Administered 2013-05-15: 100 ug via INTRAVENOUS
  Administered 2013-05-15 (×2): 50 ug via INTRAVENOUS
  Administered 2013-05-15: 100 ug via INTRAVENOUS

## 2013-05-15 MED ORDER — LEVOFLOXACIN IN D5W 500 MG/100ML IV SOLN
500.0000 mg | INTRAVENOUS | Status: DC
  Filled 2013-05-15: qty 100

## 2013-05-15 MED ORDER — LACTATED RINGERS IV SOLN
INTRAVENOUS | Status: DC
  Administered 2013-05-16 – 2013-05-18 (×4): via INTRAVENOUS

## 2013-05-15 MED ORDER — HYDROMORPHONE HCL PF 1 MG/ML IJ SOLN
INTRAMUSCULAR | Status: AC
  Filled 2013-05-15: qty 1

## 2013-05-15 MED ORDER — NEOSTIGMINE METHYLSULFATE 1 MG/ML IJ SOLN
INTRAMUSCULAR | Status: DC | PRN
  Administered 2013-05-15: 3 mg via INTRAVENOUS

## 2013-05-15 MED ORDER — HYDROMORPHONE HCL PF 1 MG/ML IJ SOLN
0.2500 mg | INTRAMUSCULAR | Status: DC | PRN
  Administered 2013-05-15 (×2): 1 mg via INTRAVENOUS

## 2013-05-15 MED ORDER — SODIUM CHLORIDE 0.9 % IJ SOLN
OROMUCOSAL | Status: DC | PRN
  Administered 2013-05-15: 14:00:00 via TOPICAL

## 2013-05-15 MED ORDER — OXYCODONE HCL 5 MG PO TABS
ORAL_TABLET | ORAL | Status: AC
  Administered 2013-05-16: 10 mg via ORAL
  Filled 2013-05-15: qty 2

## 2013-05-15 MED ORDER — MORPHINE SULFATE 2 MG/ML IJ SOLN: 2.0000 mg | INTRAMUSCULAR | Status: DC | PRN

## 2013-05-15 MED ORDER — NEOSTIGMINE METHYLSULFATE 1 MG/ML IJ SOLN
INTRAMUSCULAR | Status: AC
  Filled 2013-05-15: qty 10

## 2013-05-15 MED ORDER — MIDAZOLAM HCL 2 MG/2ML IJ SOLN
1.0000 mg | INTRAMUSCULAR | Status: DC | PRN
Start: 2013-05-15 — End: 2013-05-15

## 2013-05-15 MED ORDER — LEVOFLOXACIN IN D5W 750 MG/150ML IV SOLN
750.0000 mg | INTRAVENOUS | Status: DC
  Administered 2013-05-16 – 2013-05-17 (×2): 750 mg via INTRAVENOUS
  Filled 2013-05-15 (×3): qty 150

## 2013-05-15 MED ORDER — LACTATED RINGERS IV SOLN
INTRAVENOUS | Status: DC | PRN
  Administered 2013-05-15: 14:00:00 via INTRAVENOUS

## 2013-05-15 MED ORDER — ONDANSETRON HCL 4 MG/2ML IJ SOLN
INTRAMUSCULAR | Status: DC | PRN
  Administered 2013-05-15: 4 mg via INTRAVENOUS

## 2013-05-15 MED ORDER — 0.9 % SODIUM CHLORIDE (POUR BTL) OPTIME
TOPICAL | Status: DC | PRN
  Administered 2013-05-15: 1000 mL

## 2013-05-15 MED ORDER — OXYCODONE HCL 5 MG/5ML PO SOLN: 5.0000 mg | Freq: Once | ORAL | Status: DC | PRN

## 2013-05-15 MED ORDER — LEVOFLOXACIN IN D5W 500 MG/100ML IV SOLN
INTRAVENOUS | Status: DC | PRN
  Administered 2013-05-15: 500 mg via INTRAVENOUS

## 2013-05-15 MED ORDER — FENTANYL CITRATE 0.05 MG/ML IJ SOLN
INTRAMUSCULAR | Status: AC
Start: 2013-05-15 — End: 2013-05-16
  Filled 2013-05-15: qty 2

## 2013-05-15 MED ORDER — MIDAZOLAM HCL 5 MG/5ML IJ SOLN
INTRAMUSCULAR | Status: DC | PRN
  Administered 2013-05-15 (×2): 2 mg via INTRAVENOUS

## 2013-05-15 MED ORDER — ONDANSETRON HCL 4 MG/2ML IJ SOLN
INTRAMUSCULAR | Status: AC
  Filled 2013-05-15: qty 2

## 2013-05-15 MED ORDER — GLYCOPYRROLATE 0.2 MG/ML IJ SOLN
INTRAMUSCULAR | Status: DC | PRN
  Administered 2013-05-15: 0.4 mg via INTRAVENOUS

## 2013-05-15 MED ORDER — PROPOFOL 10 MG/ML IV BOLUS
INTRAVENOUS | Status: AC
  Filled 2013-05-15: qty 20

## 2013-05-15 MED ORDER — ROCURONIUM BROMIDE 100 MG/10ML IV SOLN
INTRAVENOUS | Status: DC | PRN
  Administered 2013-05-15: 5 mg via INTRAVENOUS
  Administered 2013-05-15: 40 mg via INTRAVENOUS

## 2013-05-15 MED ORDER — GLYCOPYRROLATE 0.2 MG/ML IJ SOLN
INTRAMUSCULAR | Status: AC
  Filled 2013-05-15: qty 2

## 2013-05-15 SURGICAL SUPPLY — 46 items
ATTRACTOMAT 16X20 MAGNETIC DRP (DRAPES) ×3 IMPLANT
BAG URIMETER 350ML BARDEX IC (UROLOGICAL SUPPLIES)
BAG URIMETER BARDEX IC 350 (UROLOGICAL SUPPLIES) IMPLANT
BLADE SURG 10 STRL SS (BLADE) ×3 IMPLANT
CANISTER SUCTION 2500CC (MISCELLANEOUS) ×3 IMPLANT
CATH FOLEY 2WAY SLVR  5CC 16FR (CATHETERS)
CATH FOLEY 2WAY SLVR 5CC 16FR (CATHETERS) IMPLANT
CONT SPEC 4OZ CLIKSEAL STRL BL (MISCELLANEOUS) IMPLANT
DRAIN CHANNEL 19F RND (DRAIN) ×3 IMPLANT
DRAPE LAPAROSCOPIC ABDOMINAL (DRAPES) ×3 IMPLANT
DRAPE WARM FLUID 44X44 (DRAPE) IMPLANT
DRSG PAD ABDOMINAL 8X10 ST (GAUZE/BANDAGES/DRESSINGS) IMPLANT
DRSG VAC ATS MED SENSATRAC (GAUZE/BANDAGES/DRESSINGS) ×3 IMPLANT
ELECT REM PT RETURN 9FT ADLT (ELECTROSURGICAL) ×3
ELECTRODE REM PT RTRN 9FT ADLT (ELECTROSURGICAL) ×1 IMPLANT
EVACUATOR SILICONE 100CC (DRAIN) ×3 IMPLANT
GLOVE EUDERMIC 7 POWDERFREE (GLOVE) ×6 IMPLANT
GOWN STRL NON-REIN LRG LVL3 (GOWN DISPOSABLE) ×12 IMPLANT
HANDPIECE INTERPULSE COAX TIP (DISPOSABLE)
HEMOSTAT POWDER SURGIFOAM 1G (HEMOSTASIS) IMPLANT
KIT BASIN OR (CUSTOM PROCEDURE TRAY) ×3 IMPLANT
KIT ROOM TURNOVER OR (KITS) ×3 IMPLANT
NS IRRIG 1000ML POUR BTL (IV SOLUTION) ×3 IMPLANT
PACK CHEST (CUSTOM PROCEDURE TRAY) ×3 IMPLANT
PAD ARMBOARD 7.5X6 YLW CONV (MISCELLANEOUS) ×6 IMPLANT
PAD NEG PRESSURE SENSATRAC (MISCELLANEOUS) ×3 IMPLANT
SET HNDPC FAN SPRY TIP SCT (DISPOSABLE) IMPLANT
SOLUTION BETADINE 4OZ (MISCELLANEOUS) IMPLANT
SPONGE GAUZE 4X4 12PLY (GAUZE/BANDAGES/DRESSINGS) ×3 IMPLANT
SPONGE GAUZE 4X4 12PLY STER LF (GAUZE/BANDAGES/DRESSINGS) ×3 IMPLANT
SPONGE LAP 18X18 X RAY DECT (DISPOSABLE) ×3 IMPLANT
SUT SILK  1 MH (SUTURE) ×4
SUT SILK 1 MH (SUTURE) ×2 IMPLANT
SUT STEEL 6MS V (SUTURE) IMPLANT
SUT STEEL STERNAL CCS#1 18IN (SUTURE) IMPLANT
SUT STEEL SZ 6 DBL 3X14 BALL (SUTURE) IMPLANT
SUT VIC AB 1 CTX 36 (SUTURE) ×4
SUT VIC AB 1 CTX36XBRD ANBCTR (SUTURE) ×2 IMPLANT
SUT VIC AB 2-0 CTX 27 (SUTURE) ×6 IMPLANT
SUT VIC AB 3-0 X1 27 (SUTURE) ×6 IMPLANT
SWAB COLLECTION DEVICE MRSA (MISCELLANEOUS) IMPLANT
SYR 5ML LL (SYRINGE) IMPLANT
TOWEL OR 17X24 6PK STRL BLUE (TOWEL DISPOSABLE) ×3 IMPLANT
TOWEL OR 17X26 10 PK STRL BLUE (TOWEL DISPOSABLE) ×3 IMPLANT
TUBE ANAEROBIC SPECIMEN COL (MISCELLANEOUS) IMPLANT
WATER STERILE IRR 1000ML POUR (IV SOLUTION) ×3 IMPLANT

## 2013-05-15 NOTE — Preoperative (Signed)
Beta Blockers   Reason not to administer Beta Blockers:Not Applicable 

## 2013-05-15 NOTE — Anesthesia Postprocedure Evaluation (Signed)
  Anesthesia Post-op Note  Patient: Troy [name]  Procedure(s) Performed: Procedure(s): STERNAL WOUND DEBRIDEMENT (N/A) APPLICATION OF WOUND VAC (N/A)  Patient Location: PACU  Anesthesia Type:General  Level of Consciousness: awake and alert   Airway and Oxygen Therapy: Patient Spontanous Breathing  Post-op Pain: mild  Post-op Assessment: Post-op Vital signs reviewed, Patient's Cardiovascular Status Stable, Respiratory Function Stable, Patent Airway, No signs of Nausea or vomiting and Pain level controlled  Post-op Vital Signs: Reviewed and stable  Complications: No apparent anesthesia complications

## 2013-05-15 NOTE — Progress Notes (Signed)
Patient ID: Troy [name], male   DOB: 1940-09-14, 73 y.o.   MRN: [id number]  SICU Evening Rounds:  Hemodynamically stable  Urine output ok  CBC    Component Value Date/Time   WBC 21.4* 05/14/2013 1635   RBC 3.22* 05/14/2013 1635   HGB 8.9* 05/14/2013 1635   HCT 27.8* 05/14/2013 1635   PLT 580* 05/14/2013 1635   MCV 86.3 05/14/2013 1635   MCH 27.6 05/14/2013 1635   MCHC 32.0 05/14/2013 1635   RDW 17.0* 05/14/2013 1635   LYMPHSABS 2.3 05/14/2013 1635   MONOABS 2.2* 05/14/2013 1635   EOSABS 0.1 05/14/2013 1635   BASOSABS 0.1 05/14/2013 1635    BMET    Component Value Date/Time   NA 134* 05/14/2013 2040   K 4.7 05/14/2013 2040   CL 96 05/14/2013 2040   CO2 24 05/14/2013 2040   GLUCOSE 117* 05/14/2013 2040   BUN 16 05/14/2013 2040   CREATININE 0.67 05/14/2013 2040   CALCIUM 9.0 05/14/2013 2040   GFRNONAA >90 05/14/2013 2040   GFRAA >90 05/14/2013 2040    A/P: stable. Remove brachial arterial line. Continue wound VAC

## 2013-05-15 NOTE — Interval H&P Note (Signed)
History and Physical Interval Note:  05/15/2013 1:59 PM  Troy [name]  has presented today for surgery, with the diagnosis of Sternal wound infection  The various methods of treatment have been discussed with the patient and family. After consideration of risks, benefits and other options for treatment, the patient has consented to  Procedure(s): STERNAL WOUND DEBRIDEMENT (N/A) as a surgical intervention .  The patient's history has been reviewed, patient examined, no change in status, stable for surgery.  I have reviewed the patient's chart and labs.  Questions were answered to the patient's satisfaction.     [name]

## 2013-05-15 NOTE — Progress Notes (Signed)
Pt has not voided since 1100, does not feel need to void at this time.  Bladder scan shows greater than 200cc.  Dr. Cyndia Bent notified, orders received. Will continue to monitor.  Vista Lawman, RN

## 2013-05-15 NOTE — Anesthesia Preprocedure Evaluation (Signed)
Anesthesia Evaluation   Patient awake    Reviewed: Allergy & Precautions, H&P , NPO status , Patient's Chart, lab work & pertinent test results  Airway Mallampati: II TM Distance: >3 FB Neck ROM: Full    Dental   Pulmonary shortness of breath, sleep apnea , COPDformer smoker,  S/p thoracotomy w/bx + rhonchi         Cardiovascular hypertension, + angina with exertion + CAD and + CABG Rhythm:Regular Rate:Tachycardia     Neuro/Psych Depression    GI/Hepatic   Endo/Other    Renal/GU      Musculoskeletal   Abdominal   Peds  Hematology   Anesthesia Other Findings   Reproductive/Obstetrics                           Anesthesia Physical Anesthesia Plan  ASA: IV  Anesthesia Plan: General   Post-op Pain Management:    Induction: Intravenous  Airway Management Planned: Oral ETT  Additional Equipment: Arterial line and CVP  Intra-op Plan:   Post-operative Plan: Possible Post-op intubation/ventilation  Informed Consent: I have reviewed the patients History and Physical, chart, labs and discussed the procedure including the risks, benefits and alternatives for the proposed anesthesia with the patient or authorized representative who has indicated his/her understanding and acceptance.     Plan Discussed with: CRNA and Surgeon  Anesthesia Plan Comments:         Anesthesia Quick Evaluation

## 2013-05-15 NOTE — Brief Op Note (Signed)
05/14/2013 - 05/15/2013  4:04 PM  PATIENT:  Troy [name]  73 y.o. male  PRE-OPERATIVE DIAGNOSIS:  Sternal wound infection  POST-OPERATIVE DIAGNOSIS:  Sternal wound infection  PROCEDURE:  Procedure(s): STERNAL WOUND DEBRIDEMENT (N/A) APPLICATION OF WOUND VAC (N/A)  SURGEON:  Surgeon(s) and Role:    * Melrose Nakayama, MD - Primary   ANESTHESIA:   general  EBL:  Total I/O In: 1300 [I.V.:1300] Out: 250 [Urine:250]  BLOOD ADMINISTERED:none  DRAINS: 21 F Blake in mediastinum, Wound vac sternal wound  LOCAL MEDICATIONS USED:  NONE  SPECIMEN:  Source of Specimen:  sternal wound fluid and tissue  DISPOSITION OF SPECIMEN:  Micro  COUNTS:  YES  PLAN OF CARE: Admit to inpatient   PATIENT DISPOSITION:  PACU - hemodynamically stable.   Delay start of Pharmacological VTE agent (>24hrs) due to surgical blood loss or risk of bleeding: yes  Purulent fluid in anterior mediastinum. Upper 2/3 of sternum healed.

## 2013-05-15 NOTE — Transfer of Care (Signed)
Immediate Anesthesia Transfer of Care Note  Patient: Troy [name]  Procedure(s) Performed: Procedure(s): STERNAL WOUND DEBRIDEMENT (N/A) APPLICATION OF WOUND VAC (N/A)  Patient Location: PACU  Anesthesia Type:General  Level of Consciousness: awake, alert , oriented, patient cooperative and responds to stimulation  Airway & Oxygen Therapy: Patient Spontanous Breathing and Patient connected to nasal cannula oxygen  Post-op Assessment: Report given to PACU RN, Post -op Vital signs reviewed and stable and Patient moving all extremities X 4  Post vital signs: Reviewed and stable  Complications: No apparent anesthesia complications

## 2013-05-15 NOTE — ED Provider Notes (Signed)
Medical screening examination/treatment/procedure(s) were conducted as a shared visit with non-physician practitioner(s) and myself.  I personally evaluated the patient during the encounter.  redness and drainage from sternal incision. S/p CABG and lobectomy on 1/29.  Denies fever. Purulent drainage from inferior aspect of sternal scar. CTAB, RRR Start IV vancomycin for wound infection, dw Dr. Shelby Dubin, MD 05/15/13 [phone]

## 2013-05-16 ENCOUNTER — Encounter (HOSPITAL_COMMUNITY): Payer: Self-pay | Admitting: Thoracic Surgery (Cardiothoracic Vascular Surgery)

## 2013-05-16 ENCOUNTER — Inpatient Hospital Stay (HOSPITAL_COMMUNITY): Payer: Medicare Other

## 2013-05-16 LAB — CBC
HCT: 25.6 % — ABNORMAL LOW (ref 39.0–52.0)
Hemoglobin: 8.3 g/dL — ABNORMAL LOW (ref 13.0–17.0)
MCH: 28.1 pg (ref 26.0–34.0)
MCHC: 32.4 g/dL (ref 30.0–36.0)
MCV: 86.8 fL (ref 78.0–100.0)
Platelets: 476 10*3/uL — ABNORMAL HIGH (ref 150–400)
RBC: 2.95 MIL/uL — ABNORMAL LOW (ref 4.22–5.81)
RDW: 16.9 % — AB (ref 11.5–15.5)
WBC: 18.9 10*3/uL — ABNORMAL HIGH (ref 4.0–10.5)

## 2013-05-16 LAB — BASIC METABOLIC PANEL
BUN: 7 mg/dL (ref 6–23)
CALCIUM: 8.4 mg/dL (ref 8.4–10.5)
CO2: 26 meq/L (ref 19–32)
CREATININE: 0.69 mg/dL (ref 0.50–1.35)
Chloride: 96 mEq/L (ref 96–112)
GFR calc Af Amer: >90 mL/min (ref >90)
GFR calc non Af Amer: >90 mL/min (ref >90)
Glucose, Bld: 87 mg/dL (ref 70–99)
Potassium: 4.2 mEq/L (ref 3.7–5.3)
Sodium: 134 mEq/L — ABNORMAL LOW (ref 137–147)

## 2013-05-16 LAB — VANCOMYCIN, TROUGH: VANCOMYCIN TR: 12.5 ug/mL (ref 10.0–20.0)

## 2013-05-16 MED ORDER — ALBUMIN HUMAN 5 % IV SOLN
12.5000 g | Freq: Once | INTRAVENOUS | Status: AC
  Administered 2013-05-16: 12.5 g via INTRAVENOUS
  Filled 2013-05-16: qty 250

## 2013-05-16 MED ORDER — SODIUM CHLORIDE 0.9 % IV SOLN
INTRAVENOUS | Status: AC
  Administered 2013-05-16: 09:00:00 via INTRAVENOUS

## 2013-05-16 MED ORDER — BOOST / RESOURCE BREEZE PO LIQD
1.0000 | Freq: Three times a day (TID) | ORAL | Status: DC
Start: 2013-05-16 — End: 2013-05-31
  Administered 2013-05-16 – 2013-05-30 (×32): 1 via ORAL
  Filled 2013-05-16: qty 1

## 2013-05-16 MED ORDER — VANCOMYCIN HCL 10 G IV SOLR
1500.0000 mg | Freq: Two times a day (BID) | INTRAVENOUS | Status: DC
  Administered 2013-05-16 – 2013-05-17 (×3): 1500 mg via INTRAVENOUS
  Filled 2013-05-16 (×5): qty 1500

## 2013-05-16 NOTE — Progress Notes (Signed)
CT surgery p.m. Rounds  Patient sitting up in chair Plan for wound VAC sternal debridement tomorrow p.m. Urine output low, systolic blood pressure 68/11 and he appears somewhat dry 1 unit albumin 5% ordered

## 2013-05-16 NOTE — Progress Notes (Signed)
ANTIBIOTIC CONSULT NOTE - FOLLOW UP  Pharmacy Consult for Vancomycin Indication: Sternal wound infection  Allergies  Allergen Reactions  . Augmentin [Amoxicillin-Pot Clavulanate] Other (See Comments)    "passed out" when taking augmentin and flagyl together  . Flagyl [Metronidazole] Other (See Comments)    "passed out" when taking augmentin and flagyl together  . Tape Other (See Comments)    Blisters (need to use paper tape)    Patient Measurements: Height: 6' (182.9 cm) Weight: 146 lb 13.2 oz (66.6 kg) IBW/kg (Calculated) : 77.6 Adjusted Body Weight:   Vital Signs: Temp: 97.6 F (36.4 C) (02/24 2000) Temp src: Oral (02/24 2000) BP: 105/65 mmHg (02/24 2000) Pulse Rate: 95 (02/24 2000) Intake/Output from previous day: 02/23 0701 - 02/24 0700 In: 3510 [P.O.:360; I.V.:2400; IV Piggyback:750] Out: 1615 [Urine:1415; Drains:200] Intake/Output from this shift: Total I/O In: 270 [I.V.:20; IV Piggyback:250] Out: 30 [Urine:30]  Labs:  Recent Labs  05/14/13 1635 05/14/13 2040 05/16/13 0354  WBC 21.4*  --  18.9*  HGB 8.9*  --  8.3*  PLT 580*  --  476*  CREATININE 0.65 0.67 0.69   Estimated Creatinine Clearance: 79.8 ml/min (by C-G formula based on Cr of 0.69).  Recent Labs  05/16/13 2011  [place] 12.5     Microbiology: Recent Results (from the past 720 hour(s))  SURGICAL PCR SCREEN     Status: None   Collection Time    04/18/13 12:46 PM      Result Value Ref Range Status   MRSA, PCR NEGATIVE  NEGATIVE Final   Staphylococcus aureus NEGATIVE  NEGATIVE Final   Comment:            The Xpert SA Assay (FDA     approved for NASAL specimens     in patients over 36 years of age),     is one component of     a comprehensive surveillance     program.  Test performance has     been validated by [employer] for patients greater     than or equal to 12 year old.     It is not intended     to diagnose infection nor to     guide or monitor treatment.  CULTURE,  BLOOD (ROUTINE X 2)     Status: None   Collection Time    04/26/13  4:20 PM      Result Value Ref Range Status   Specimen Description BLOOD LEFT ARM   Final   Special Requests BOTTLES DRAWN AEROBIC AND ANAEROBIC 10CC   Final   Culture  Setup Time     Final   Value: 04/26/2013 22:15     Performed at [facility]   Culture     Final   Value: NO GROWTH 5 DAYS     Performed at [facility]   Report Status 05/02/2013 FINAL   Final  CULTURE, BLOOD (ROUTINE X 2)     Status: None   Collection Time    04/26/13  4:32 PM      Result Value Ref Range Status   Specimen Description BLOOD RIGHT HAND   Final   Special Requests     Final   Value: BOTTLES DRAWN AEROBIC AND ANAEROBIC BLUE 10CC RED 5CC   Culture  Setup Time     Final   Value: 04/26/2013 22:15     Performed at      Final  Value: NO GROWTH 5 DAYS     Performed at [facility]   Report Status 05/02/2013 FINAL   Final  URINE CULTURE     Status: None   Collection Time    04/26/13  8:12 PM      Result Value Ref Range Status   Specimen Description URINE, CLEAN CATCH   Final   Special Requests ADDED [id number]   Final   Culture  Setup Time     Final   Value: 04/27/2013 13:06     Performed at [place]     Final   Value: 30,000 COLONIES/ML     Performed at [facility]   Culture     Final   Value: Multiple bacterial morphotypes present, none predominant. Suggest appropriate recollection if clinically indicated.     Performed at [facility]   Report Status 04/28/2013 FINAL   Final  CULTURE, BLOOD (ROUTINE X 2)     Status: None   Collection Time    05/14/13  3:58 PM      Result Value Ref Range Status   Specimen Description BLOOD LEFT ARM   Final   Special Requests BOTTLES DRAWN AEROBIC AND ANAEROBIC 5CC   Final   Culture  Setup Time     Final   Value: 05/14/2013 19:47     Performed at [facility]   Culture     Final    Value:        BLOOD CULTURE RECEIVED NO GROWTH TO DATE CULTURE WILL BE HELD FOR 5 DAYS BEFORE ISSUING A FINAL NEGATIVE REPORT     Performed at [facility]   Report Status PENDING   Incomplete  WOUND CULTURE     Status: None   Collection Time    05/14/13  6:31 PM      Result Value Ref Range Status   Specimen Description WOUND CHEST   Final   Special Requests NONE   Final   Gram Stain     Final   Value: FEW WBC PRESENT, PREDOMINANTLY PMN     NO SQUAMOUS EPITHELIAL CELLS SEEN     FEW GRAM NEGATIVE RODS     Performed at [facility]   Culture     Final   Value: NO GROWTH 1 DAY     Performed at [facility]   Report Status PENDING   Incomplete  CULTURE, BLOOD (ROUTINE X 2)     Status: None   Collection Time    05/14/13  8:40 PM      Result Value Ref Range Status   Specimen Description BLOOD RIGHT ARM   Final   Special Requests BOTTLES DRAWN AEROBIC ONLY 4CC   Final   Culture  Setup Time     Final   Value: 05/15/2013 00:45     Performed at [facility]   Culture     Final   Value:        BLOOD CULTURE RECEIVED NO GROWTH TO DATE CULTURE WILL BE HELD FOR 5 DAYS BEFORE ISSUING A FINAL NEGATIVE REPORT     Performed at [facility]   Report Status PENDING   Incomplete  WOUND CULTURE     Status: None   Collection Time    05/15/13  2:49 PM      Result Value Ref Range Status   Specimen Description WOUND STERNUM   Final   Special Requests NO  1 PT ON VANCO LAVEQUIN   Final   Gram Stain     Final   Value: MODERATE WBC PRESENT, PREDOMINANTLY PMN     NO SQUAMOUS EPITHELIAL CELLS SEEN     MODERATE GRAM NEGATIVE RODS     Performed at [facility]   Culture     Final   Value: NO GROWTH 1 DAY     Performed at [facility]   Report Status PENDING   Incomplete  ANAEROBIC CULTURE     Status: None   Collection Time    05/15/13  2:49 PM      Result Value Ref Range Status   Specimen Description WOUND STERNUM   Final   Special Requests NO  1 PT ON VANCO LAVEQUIN   Final   Gram Stain     Final   Value: MODERATE WBC PRESENT, PREDOMINANTLY PMN     NO SQUAMOUS EPITHELIAL CELLS SEEN     MODERATE GRAM NEGATIVE RODS     Performed at [facility]   Culture     Final   Value: NO ANAEROBES ISOLATED; CULTURE IN PROGRESS FOR 5 DAYS     Performed at [facility]   Report Status PENDING   Incomplete  TISSUE CULTURE     Status: None   Collection Time    05/15/13  2:54 PM      Result Value Ref Range Status   Specimen Description TISSUE STERNUM   Final   Special Requests NO 2 PT ON VANCO LAVEQUIN   Final   Gram Stain     Final   Value: MODERATE WBC PRESENT, PREDOMINANTLY PMN     NO ORGANISMS SEEN     Performed at [facility]     Final   Value: NO GROWTH 1 DAY     Performed at [facility]   Report Status PENDING   Incomplete  TISSUE CULTURE     Status: None   Collection Time    05/15/13  3:03 PM      Result Value Ref Range Status   Specimen Description TISSUE STERNUM   Final   Special Requests NO 3 STERNUM BONE PT ON VANCO LEVEQUIN   Final   Gram Stain     Final   Value: RARE WBC PRESENT, PREDOMINANTLY PMN     NO ORGANISMS SEEN     Performed at [facility]   Culture     Final   Value: NO GROWTH 1 DAY     Performed at [facility]   Report Status PENDING   Incomplete    Anti-infectives   Start     Dose/Rate Route Frequency Ordered Stop   05/16/13 2130  vancomycin (VANCOCIN) 1,500 mg in sodium chloride 0.9 % 500 mL IVPB     1,500 mg 250 mL/hr over 120 Minutes Intravenous Every 12 hours 05/16/13 2106     05/16/13 1400  levofloxacin (LEVAQUIN) IVPB 750 mg     750 mg 100 mL/hr over 90 Minutes Intravenous Every 24 hours 05/15/13 1828     05/15/13 1445  levofloxacin (LEVAQUIN) IVPB 500 mg  Status:  Discontinued     500 mg 100 mL/hr over 60 Minutes Intravenous To Surgery 05/15/13 1434 05/15/13 1828   05/14/13 2100  vancomycin (VANCOCIN) 1,250 mg in sodium chloride  0.9 % 250 mL IVPB  Status:  Discontinued     1,250 mg 166.7 mL/hr over 90 Minutes Intravenous Every  12 hours 05/14/13 2005 05/16/13 2106      Assessment: 72yom on Vancomycin Day 3 for sternal wound infection. Patient is currently afebrile, WBC trending down and no significant growth on cultures. Vancomycin trough checked this PM was subtherapeutic at 12.5 mcg/ml - will increase dose and follow-up antibiotic plans. Patient is also receiving Levaquin - dosing appropriate. - SCr stable, UOP decreased today per notes  Goal of Therapy:  Vancomycin trough level 15-20 mcg/ml  Plan:  1. Increase Vancomycin to 1.5g IV q12h - first dose now (PM dose has not yet been given) 2. Monitor renal function, cultures, clinical course and follow-up antibiotic plan  Earleen Newport [phone] 05/16/2013,9:07 PM

## 2013-05-16 NOTE — Progress Notes (Signed)
Pt has had low UOP the past several hours (<30 ml/hr) with a soft systolic blood pressure. MD Roxan Hockey made aware- new orders for fluid bolus received. Will monitor.   Troy [name]

## 2013-05-16 NOTE — Progress Notes (Signed)
1 Day Post-Op Procedure(s) (LRB): STERNAL WOUND DEBRIDEMENT (N/A) APPLICATION OF WOUND VAC (N/A) Subjective: Some pain  Objective: Vital signs in last 24 hours: Temp:  [89.3 F (31.8 C)-99.8 F (37.7 C)] 98.5 F (36.9 C) (02/24 0800) Pulse Rate:  [79-111] 96 (02/24 0800) Cardiac Rhythm:  [-] Sinus tachycardia (02/24 0800) Resp:  [8-19] 17 (02/24 0800) BP: (87-118)/(54-72) 87/62 mmHg (02/24 0800) SpO2:  [92 %-100 %] 95 % (02/24 0800) Arterial Line BP: (99-136)/(54-66) 114/57 mmHg (02/23 1815) Weight:  [146 lb 13.2 oz (66.6 kg)] 146 lb 13.2 oz (66.6 kg) (02/24 0600)  Hemodynamic parameters for last 24 hours:    Intake/Output from previous day: 02/23 0701 - 02/24 0700 In: 3510 [P.O.:360; I.V.:2400; IV Piggyback:750] Out: 1615 [Urine:1415; Drains:200] Intake/Output this shift: Total I/O In: 100 [I.V.:100] Out: 30 [Urine:30]  General appearance: alert and no distress Neurologic: intact Heart: regular rate and rhythm Lungs: clear to auscultation bilaterally Abdomen: normal findings: soft, non-tender Wound: vac in place  Lab Results:  Recent Labs  05/14/13 1635 05/16/13 0354  WBC 21.4* 18.9*  HGB 8.9* 8.3*  HCT 27.8* 25.6*  PLT 580* 476*   BMET:  Recent Labs  05/14/13 2040 05/16/13 0354  NA 134* 134*  K 4.7 4.2  CL 96 96  CO2 24 26  GLUCOSE 117* 87  BUN 16 7  CREATININE 0.67 0.69  CALCIUM 9.0 8.4    PT/INR:  Recent Labs  05/14/13 2240  LABPROT 15.8*  INR 1.29   ABG    Component Value Date/Time   PHART 7.384 04/20/2013 2014   HCO3 23.6 04/20/2013 2014   TCO2 27 04/21/2013 1700   ACIDBASEDEF 1.0 04/20/2013 2014   O2SAT 99.0 04/20/2013 2014   CBG (last 3)  No results found for this basename: GLUCAP,  in the last 72 hours  Assessment/Plan: S/P Procedure(s) (LRB): STERNAL WOUND DEBRIDEMENT (N/A) APPLICATION OF WOUND VAC (N/A)  Overall looks good Anemia secondary to ABL- follow Cultures showed GNR on gram stain, no growth to date  Continue  Vanco and levafloxacin pending culture results BP a little low- 500 ml bolus of saline Regular diet Will plan to take back to OR tomorrow for VAC change and possibly further debridement   LOS: 2 days    Daryll Spisak C 05/16/2013

## 2013-05-16 NOTE — Op Note (Signed)
Troy [name], [name] NO.:  [id number]  MEDICAL RECORD NO.:  [id number]  LOCATION:  2S06C                        FACILITY:  [facility]  PHYSICIAN:  Revonda Standard. Roxan Hockey, M.D.DATE OF BIRTH:  1940-07-18  DATE OF PROCEDURE:  05/15/2013 DATE OF DISCHARGE:                              OPERATIVE REPORT   PREOPERATIVE DIAGNOSIS:  Sternal wound infection.  POSTOPERATIVE DIAGNOSIS:  Sternal wound infection.  PROCEDURE:  Sternal wound debridement and VAC placement.  SURGEON:  Revonda Standard. Roxan Hockey, M.D.  ASSISTANT:  None.  ANESTHESIA:  General.  FINDINGS:  Purulent drainage through the lower portion of the sternum extending into the anterior mediastinum.  Superior two-third of the sternum healed.  No communication to chest tube sites.  CLINICAL NOTE:  Troy [name] is a 73 year old gentleman, who underwent coronary bypass grafting x1 and right upper lobectomy via median sternotomy on April 20, 2013.  He had been discharged on postoperative day #15 after a prolonged air leak.  He had been treated prior to discharge with Levaquin for a possible UTI.  After discharge, he began having fevers.  On the day of admission, he noted an abscess on the lower part of the sternal wound.  He came to the emergency room.  While in the emergency room, the abscess drained spontaneously.  He was advised to undergo sternal wound exploration and debridement and possible VAC placement and possible sternectomy.  The indications, risks, benefits, and alternatives were discussed in detail with the patient.  He understood and accepted the risks and agreed to proceed.  OPERATIVE NOTE:  Troy [name] was brought to the operating room on May 15, 2013.  Anesthesia placed central venous access as well as an arterial blood pressure monitoring line.  Sequential compression devices were placed on the calves for DVT prophylaxis.  In addition to the intravenous vancomycin that he was already on, he was  given a dose of Levaquin intravenously.  He was anesthetized and intubated.  The packing was removed from the chest wound, and the chest was prepped and draped in usual sterile fashion.  There was an approximately 3 cm long opening, there was some purulent drainage at the base of this.  Specimens were taken for cultures for both anaerobic and aerobic bacteria.  The incision was lengthened superiorly and inferiorly.  Initially, there was no indication of where the pus was originating from.  However, with further exploration, there was pus coming through the lower portion of the sternum.  The 2 lower sternal wires were removed.  The upper portion of the sternum was very solid with no movement and appeared to be healed.  After removing the wires, it was difficult to separate the halves of the sternum.  A rongeur was used to remove the anterior table of the sternum, and then a curette and rongeur were used to remove the marrow.  The posterior table then was carefully debrided.  This allowed placement of a sucker into the chest, and there was an area that tracked superiorly under the sternum into the superior mediastinum or the anterior portion of the right pleural space.  There was purulent material in the space.  The bone was  debrided back until the marrow had relatively good vascularity. The inferior costal cartilages were debrided as was the xiphoid process. There was no communication with any of the 3 chest tube sites that could be identified in the operating room.  The options at this point were to open up the remainder of the wound, removing the remainder of the wires, and debride the entire remainder of the sternum or to place a drain and a VAC and follow the patient clinically see if it would heal without requiring total sternectomy.  A decision was made to place a Blake drain through a separate stab wound and place it into the mediastinum/pleural space.  A 19-French Blake drain was  used.  The wound was copiously irrigated with saline using a Pulsavac.  A VAC sponge then was cut to length and placed in the wound.  The dressing was applied, and vacuum was placed to the sternal wound and bulb suction was placed to the McConnells drain.  The patient then was extubated in the operating room and taken to the postanesthetic care unit in good condition.     Revonda Standard Roxan Hockey, M.D.     SCH/MEDQ  D:  05/15/2013  T:  05/16/2013  Job:  [id number]

## 2013-05-16 NOTE — Progress Notes (Signed)
INITIAL NUTRITION ASSESSMENT  DOCUMENTATION CODES Per approved criteria  -Severe malnutrition in the context of chronic illness  Pt meets criteria for severe MALNUTRITION in the context of chronic illness as evidenced by 23% wt loss in 3 1/2 months and severe fat and muscle wasting.  INTERVENTION: Resource Breeze po TID, each supplement provides 250 kcal and 9 grams of protein  NUTRITION DIAGNOSIS: Inadequate oral intake related to chronic illness as evidenced by wt loss.   Goal: Pt to meet >/= 90% of their estimated nutrition needs   Monitor:  Po intake, weight trends, I/O's, acceptance of supplements  Reason for Assessment: MST  73 y.o. male  Admitting Dx: <principal problem not specified>  ASSESSMENT: 73 year old male who is status post off-pump coronary artery bypass graft for single-vessel disease, intraoperative trans-esophageal echocardiogram, and a right upper lobectomy performed by Dr. Princess Perna over a month ago who presents to ED for postop complication.  Pt reports a weight loss of 46 lbs in 3 1/2 months. He was admitted to the hospital on 1/29 for CABG. Pt reports that he has been eating, but continues to loose weight.   Nutrition Focused Physical Exam:  Subcutaneous Fat:  Orbital Region: severe wasting Upper Arm Region: severe wasting Thoracic and Lumbar Region: n/a  Muscle:  Temple Region: severe wasting Clavicle Bone Region: severe wasting Clavicle and Acromion Bone Region: severe wasting Scapular Bone Region: n/a Dorsal Hand: severe wasting Patellar Region: severe wasting Anterior Thigh Region: severe wasting Posterior Calf Region: severe wasting  Edema: none  Height: Ht Readings from Last 1 Encounters:  05/14/13 6' (1.829 m)    Weight: Wt Readings from Last 1 Encounters:  05/16/13 146 lb 13.2 oz (66.6 kg)    Ideal Body Weight: 178 lbs  % Ideal Body Weight: 82%  Wt Readings from Last 10 Encounters:  05/16/13 146 lb 13.2 oz (66.6 kg)   05/16/13 146 lb 13.2 oz (66.6 kg)  05/05/13 152 lb 1.9 oz (69 kg)  05/05/13 152 lb 1.9 oz (69 kg)  04/19/13 169 lb (76.658 kg)  04/18/13 169 lb 8 oz (76.885 kg)  04/05/13 167 lb (75.751 kg)  03/28/13 166 lb (75.297 kg)  03/20/13 169 lb (76.658 kg)  03/20/13 167 lb (75.751 kg)    Usual Body Weight: 189 lbs  % Usual Body Weight: 76%  BMI:  Body mass index is 19.91 kg/(m^2).  Estimated Nutritional Needs: Kcal: 2000-2200 Protein: 120-130 g Fluid: 2.0-2.2 L  Skin: incision on chest  Diet Order: General  EDUCATION NEEDS: -No education needs identified at this time   Intake/Output Summary (Last 24 hours) at 05/16/13 1539 Last data filed at 05/16/13 1529  Gross per 24 hour  Intake   3614 ml  Output   1865 ml  Net   1749 ml    Last BM: none recorded   Labs:   Recent Labs Lab 05/14/13 1635 05/14/13 2040 05/16/13 0354  NA 132* 134* 134*  K 5.1 4.7 4.2  CL 94* 96 96  CO2 17* 24 26  BUN 16 16 7   CREATININE 0.65 0.67 0.69  CALCIUM 8.9 9.0 8.4  GLUCOSE 101* 117* 87    CBG (last 3)  No results found for this basename: GLUCAP,  in the last 72 hours  Scheduled Meds: . atorvastatin  20 mg Oral Daily  . enoxaparin (LOVENOX) injection  40 mg Subcutaneous Q24H  . levofloxacin (LEVAQUIN) IV  750 mg Intravenous Q24H  . montelukast  10 mg Oral Daily  . pantoprazole  40 mg Oral Q1200  . vancomycin  1,250 mg Intravenous Q12H  . vitamin B-12  1,000 mcg Oral Daily  . vitamin E  400 Units Oral Daily    Continuous Infusions: . lactated ringers 20 mL/hr (05/16/13 2585)    Past Medical History  Diagnosis Date  . CAD (coronary artery disease)   . HTN (hypertension)   . COPD (chronic obstructive pulmonary disease)   . Hyperlipidemia   . Depression   . Vitamin D deficiency   . Sleep apnea   . Anginal pain   . Shortness of breath   . Arthritis   . Polycystic kidney disease     Past Surgical History  Procedure Laterality Date  . Cholecystectomy    . Hernia  repair      x 11  . Angioplasty    . Cardiac catheterization      multiple  . Carpal tunnel release Right   . Spinal cord stimulator implant    . Back surgery    . Vagotomy    . Appendectomy    . Gastrectomy    . Nissen fundoplication    . Coronary artery bypass graft N/A 04/20/2013    Procedure: OFF PUMP CORONARY ARTERY BYPASS GRAFTING (CABG);  Surgeon: Melrose Nakayama, MD;  Location: [facility];  Service: Open Heart Surgery;  Laterality: N/A;  CABG X 1  POSSIBLE OFF PUMP  . Intraoperative transesophageal echocardiogram N/A 04/20/2013    Procedure: INTRAOPERATIVE TRANSESOPHAGEAL ECHOCARDIOGRAM;  Surgeon: Melrose Nakayama, MD;  Location: [facility];  Service: Open Heart Surgery;  Laterality: N/A;  . Lobectomy  04/20/2013    Procedure: LOBECTOMY;  Surgeon: Melrose Nakayama, MD;  Location: [facility];  Service: Open Heart Surgery;;  Right Upper Lobectomy  . Sternal wound debridement N/A 05/15/2013    Procedure: STERNAL WOUND DEBRIDEMENT;  Surgeon: Melrose Nakayama, MD;  Location: [facility];  Service: Thoracic;  Laterality: N/A;  . Application of wound vac N/A 05/15/2013    Procedure: APPLICATION OF WOUND VAC;  Surgeon: Melrose Nakayama, MD;  Location: [facility];  Service: Thoracic;  Laterality: N/A;    Terrace Arabia RD, LDN

## 2013-05-17 ENCOUNTER — Encounter (HOSPITAL_COMMUNITY)
Admission: EM | Disposition: A | Discharge status: Home Health | Payer: Self-pay | Source: Home / Self Care | Attending: Thoracic Surgery (Cardiothoracic Vascular Surgery)

## 2013-05-17 ENCOUNTER — Encounter (HOSPITAL_COMMUNITY): Payer: Self-pay | Admitting: Anesthesiology

## 2013-05-17 ENCOUNTER — Inpatient Hospital Stay (HOSPITAL_COMMUNITY): Payer: Medicare Other | Admitting: Anesthesiology

## 2013-05-17 ENCOUNTER — Encounter (HOSPITAL_COMMUNITY): Payer: Medicare Other | Admitting: Anesthesiology

## 2013-05-17 ENCOUNTER — Encounter: Payer: Medicare Other | Admitting: Cardiovascular Disease

## 2013-05-17 DIAGNOSIS — T8140XA Infection following a procedure, unspecified, initial encounter: Secondary | ICD-10-CM

## 2013-05-17 HISTORY — PX: STERNAL WOUND DEBRIDEMENT: SHX1058

## 2013-05-17 HISTORY — PX: APPLICATION OF WOUND VAC: SHX5189

## 2013-05-17 LAB — WOUND CULTURE
CULTURE: NO GROWTH
CULTURE: NO GROWTH

## 2013-05-17 SURGERY — DEBRIDEMENT, WOUND, STERNUM
Anesthesia: General | Site: Chest

## 2013-05-17 MED ORDER — MIDAZOLAM HCL 2 MG/2ML IJ SOLN
INTRAMUSCULAR | Status: AC
  Filled 2013-05-17: qty 2

## 2013-05-17 MED ORDER — PROPOFOL 10 MG/ML IV BOLUS
INTRAVENOUS | Status: AC
  Filled 2013-05-17: qty 20

## 2013-05-17 MED ORDER — NEOSTIGMINE METHYLSULFATE 1 MG/ML IJ SOLN
INTRAMUSCULAR | Status: DC | PRN
  Administered 2013-05-17: 4 mg via INTRAVENOUS

## 2013-05-17 MED ORDER — LACTATED RINGERS IV SOLN
INTRAVENOUS | Status: DC | PRN
  Administered 2013-05-17: 15:00:00 via INTRAVENOUS

## 2013-05-17 MED ORDER — FENTANYL CITRATE 0.05 MG/ML IJ SOLN
INTRAMUSCULAR | Status: AC
  Filled 2013-05-17: qty 2

## 2013-05-17 MED ORDER — ALBUMIN HUMAN 5 % IV SOLN
INTRAVENOUS | Status: AC
  Administered 2013-05-18: 12.5 g via INTRAVENOUS
  Filled 2013-05-17: qty 250

## 2013-05-17 MED ORDER — FENTANYL CITRATE 0.05 MG/ML IJ SOLN
INTRAMUSCULAR | Status: AC
  Administered 2013-05-17: 50 ug via INTRAVENOUS
  Filled 2013-05-17: qty 2

## 2013-05-17 MED ORDER — FENTANYL CITRATE 0.05 MG/ML IJ SOLN
25.0000 ug | INTRAMUSCULAR | Status: DC | PRN
  Administered 2013-05-19 – 2013-05-26 (×10): 25 ug via INTRAVENOUS
  Filled 2013-05-17 (×12): qty 2

## 2013-05-17 MED ORDER — SODIUM CHLORIDE 0.9 % IR SOLN
Status: DC | PRN
  Administered 2013-05-17: 3000 mL

## 2013-05-17 MED ORDER — FENTANYL CITRATE 0.05 MG/ML IJ SOLN: 25.0000 ug | INTRAMUSCULAR | Status: DC | PRN

## 2013-05-17 MED ORDER — ALBUMIN HUMAN 5 % IV SOLN
12.5000 g | Freq: Once | INTRAVENOUS | Status: AC
  Administered 2013-05-18: 12.5 g via INTRAVENOUS

## 2013-05-17 MED ORDER — PROPOFOL 10 MG/ML IV BOLUS
INTRAVENOUS | Status: DC | PRN
  Administered 2013-05-17: 90 mg via INTRAVENOUS

## 2013-05-17 MED ORDER — ONDANSETRON HCL 4 MG/2ML IJ SOLN
INTRAMUSCULAR | Status: DC | PRN
  Administered 2013-05-17: 4 mg via INTRAVENOUS

## 2013-05-17 MED ORDER — LACTATED RINGERS IV SOLN
INTRAVENOUS | Status: DC
  Administered 2013-05-17: 14:00:00 via INTRAVENOUS

## 2013-05-17 MED ORDER — IBUPROFEN 400 MG PO TABS
400.0000 mg | ORAL_TABLET | Freq: Four times a day (QID) | ORAL | Status: DC | PRN
  Administered 2013-05-17 (×2): 400 mg via ORAL
  Filled 2013-05-17 (×2): qty 1

## 2013-05-17 MED ORDER — PHENYLEPHRINE HCL 10 MG/ML IJ SOLN
10.0000 mg | INTRAVENOUS | Status: DC | PRN
  Administered 2013-05-17: 10 ug/min via INTRAVENOUS

## 2013-05-17 MED ORDER — 0.9 % SODIUM CHLORIDE (POUR BTL) OPTIME
TOPICAL | Status: DC | PRN
  Administered 2013-05-17: 1000 mL

## 2013-05-17 MED ORDER — ROCURONIUM BROMIDE 100 MG/10ML IV SOLN
INTRAVENOUS | Status: DC | PRN
  Administered 2013-05-17: 30 mg via INTRAVENOUS

## 2013-05-17 MED ORDER — GLYCOPYRROLATE 0.2 MG/ML IJ SOLN
INTRAMUSCULAR | Status: DC | PRN
  Administered 2013-05-17: 0.6 mg via INTRAVENOUS

## 2013-05-17 MED ORDER — FENTANYL CITRATE 0.05 MG/ML IJ SOLN
25.0000 ug | INTRAMUSCULAR | Status: DC | PRN
  Administered 2013-05-17 (×2): 50 ug via INTRAVENOUS
  Administered 2013-05-17 (×2): 25 ug via INTRAVENOUS

## 2013-05-17 MED ORDER — FENTANYL CITRATE 0.05 MG/ML IJ SOLN
INTRAMUSCULAR | Status: DC | PRN
  Administered 2013-05-17: 50 ug via INTRAVENOUS

## 2013-05-17 MED ORDER — FENTANYL CITRATE 0.05 MG/ML IJ SOLN
INTRAMUSCULAR | Status: AC
  Filled 2013-05-17: qty 5

## 2013-05-17 SURGICAL SUPPLY — 54 items
BLADE SURG 10 STRL SS (BLADE) ×6 IMPLANT
CANISTER SUCTION 2500CC (MISCELLANEOUS) ×3 IMPLANT
CANISTER WOUND CARE 500ML ATS (WOUND CARE) ×3 IMPLANT
CLEANER TIP ELECTROSURG 2X2 (MISCELLANEOUS) ×3 IMPLANT
CONT SPEC 4OZ CLIKSEAL STRL BL (MISCELLANEOUS) IMPLANT
COVER SURGICAL LIGHT HANDLE (MISCELLANEOUS) ×3 IMPLANT
DRAPE CHEST BREAST 15X10 FENES (DRAPES) ×3 IMPLANT
DRAPE LAPAROSCOPIC ABDOMINAL (DRAPES) IMPLANT
DRAPE SLUSH/WARMER DISC (DRAPES) IMPLANT
DRSG PAD ABDOMINAL 8X10 ST (GAUZE/BANDAGES/DRESSINGS) IMPLANT
DRSG VAC ATS MED SENSATRAC (GAUZE/BANDAGES/DRESSINGS) ×3 IMPLANT
ELECT REM PT RETURN 9FT ADLT (ELECTROSURGICAL) ×3
ELECTRODE REM PT RTRN 9FT ADLT (ELECTROSURGICAL) ×1 IMPLANT
GLOVE BIO SURGEON STRL SZ 6 (GLOVE) ×3 IMPLANT
GLOVE BIOGEL PI IND STRL 6.5 (GLOVE) ×1 IMPLANT
GLOVE BIOGEL PI IND STRL 7.5 (GLOVE) ×1 IMPLANT
GLOVE BIOGEL PI INDICATOR 6.5 (GLOVE) ×2
GLOVE BIOGEL PI INDICATOR 7.5 (GLOVE) ×2
GLOVE EUDERMIC 7 POWDERFREE (GLOVE) ×6 IMPLANT
GOWN STRL REUS W/ TWL LRG LVL3 (GOWN DISPOSABLE) ×2 IMPLANT
GOWN STRL REUS W/ TWL XL LVL3 (GOWN DISPOSABLE) ×1 IMPLANT
GOWN STRL REUS W/TWL LRG LVL3 (GOWN DISPOSABLE) ×4
GOWN STRL REUS W/TWL XL LVL3 (GOWN DISPOSABLE) ×2
HANDPIECE INTERPULSE COAX TIP (DISPOSABLE) ×2
HEMOSTAT POWDER SURGIFOAM 1G (HEMOSTASIS) IMPLANT
IV NS 1000ML (IV SOLUTION) ×4
IV NS 1000ML BAXH (IV SOLUTION) ×2 IMPLANT
KIT BASIN OR (CUSTOM PROCEDURE TRAY) ×3 IMPLANT
KIT ROOM TURNOVER OR (KITS) ×3 IMPLANT
NS IRRIG 1000ML POUR BTL (IV SOLUTION) ×6 IMPLANT
PACK CHEST (CUSTOM PROCEDURE TRAY) IMPLANT
PACK SURGICAL SETUP 50X90 (CUSTOM PROCEDURE TRAY) ×3 IMPLANT
PAD ARMBOARD 7.5X6 YLW CONV (MISCELLANEOUS) ×6 IMPLANT
PENCIL BUTTON HOLSTER BLD 10FT (ELECTRODE) ×6 IMPLANT
SET HNDPC FAN SPRY TIP SCT (DISPOSABLE) ×1 IMPLANT
SPONGE GAUZE 4X4 12PLY (GAUZE/BANDAGES/DRESSINGS) ×3 IMPLANT
SPONGE LAP 18X18 X RAY DECT (DISPOSABLE) ×6 IMPLANT
SUT STEEL 6MS V (SUTURE) IMPLANT
SUT STEEL STERNAL CCS#1 18IN (SUTURE) IMPLANT
SUT STEEL SZ 6 DBL 3X14 BALL (SUTURE) IMPLANT
SUT VIC AB 1 CTX 36 (SUTURE) ×4
SUT VIC AB 1 CTX36XBRD ANBCTR (SUTURE) ×2 IMPLANT
SUT VIC AB 2-0 CTX 27 (SUTURE) ×6 IMPLANT
SUT VIC AB 3-0 X1 27 (SUTURE) ×6 IMPLANT
SWAB COLLECTION DEVICE MRSA (MISCELLANEOUS) IMPLANT
SYR 5ML LL (SYRINGE) IMPLANT
TOWEL OR 17X24 6PK STRL BLUE (TOWEL DISPOSABLE) ×3 IMPLANT
TOWEL OR 17X26 10 PK STRL BLUE (TOWEL DISPOSABLE) ×3 IMPLANT
TRAY FOLEY CATH 14FRSI W/METER (CATHETERS) IMPLANT
TUBE ANAEROBIC SPECIMEN COL (MISCELLANEOUS) IMPLANT
TUBE CONNECTING 12'X1/4 (SUCTIONS) ×1
TUBE CONNECTING 12X1/4 (SUCTIONS) ×2 IMPLANT
WATER STERILE IRR 1000ML POUR (IV SOLUTION) ×3 IMPLANT
YANKAUER SUCT BULB TIP NO VENT (SUCTIONS) ×3 IMPLANT

## 2013-05-17 NOTE — Clinical Documentation Improvement (Signed)
THIS DOCUMENT IS NOT A PERMANENT PART OF THE MEDICAL RECORD  Please update your documentation with the medical record to reflect your response to this query. If you need help knowing how to do this please call [phone].  05/17/13   Dear Dr.Hendrickson / Associates,  In a better effort to capture your patient's severity of illness, reflect appropriate length of stay and utilization of resources, a review of the patient medical record has revealed the following indicators.    Based on your clinical judgment, please clarify and document in a progress note and/or discharge summary the clinical condition associated with the following supporting information:  In responding to this query please exercise your independent judgment.  The fact that a query is asked, does not imply that any particular answer is desired or expected.   Possible Clinical Conditions?  "     Severe Malnutrition    "     Severe Protein Calorie Malnutrition  "     Other Condition  "     Cannot clinically determine     Nutrition Consult: 05/16/13: Patient meets criteria for Severe Malnutrition in the context of chronic illness as evidenced by 23% weight loss in 3 1/2 months and severe fat and muscle wasting.  Signs & Symptoms: Ht: 6'     Wt: 146 lb 13.2 oz BMI:  19.91  Weight  Loss: 46 lb over 3 1/2 months.  Treatment  Resource Breeze po TID.  You may use possible, probable, or suspect with inpatient documentation. possible, probable, suspected diagnoses MUST be documented at the time of discharge  Reviewed: additional documentation in the medical record  Thank Sherian Maroon Documentation Specialist: [place]

## 2013-05-17 NOTE — Anesthesia Procedure Notes (Signed)
Procedure Name: Intubation Date/Time: 05/17/2013 3:11 PM Performed by: Maude Leriche D Pre-anesthesia Checklist: Patient identified, Emergency Drugs available, Suction available, Patient being monitored and Timeout performed Patient Re-evaluated:Patient Re-evaluated prior to inductionOxygen Delivery Method: Circle system utilized Preoxygenation: Pre-oxygenation with 100% oxygen Intubation Type: IV induction Ventilation: Mask ventilation without difficulty and Oral airway inserted - appropriate to patient size Laryngoscope Size: Miller and 2 Grade View: Grade I Tube type: Oral Tube size: 7.5 mm Number of attempts: 1 Airway Equipment and Method: Stylet Placement Confirmation: ETT inserted through vocal cords under direct vision,  positive ETCO2 and breath sounds checked- equal and bilateral Secured at: 21 cm Tube secured with: Tape Dental Injury: Teeth and Oropharynx as per pre-operative assessment

## 2013-05-17 NOTE — H&P (View-Only) (Signed)
2 Days Post-Op Procedure(s) (LRB): STERNAL WOUND DEBRIDEMENT (N/A) APPLICATION OF WOUND VAC (N/A) Subjective: [name]/o right knee pain- swelled up and started hurting after walk last night. Happens a few times a year and says TKR has been recommended. Usually takes ibuprofen adn sometimes steroids for the pain  No difficulty breathing  Objective: Vital signs in last 24 hours: Temp:  [97.6 F (36.4 [name])-98.9 F (37.2 [name])] 98.9 F (37.2 [name]) (02/25 0741) Pulse Rate:  [71-110] 87 (02/25 0600) Cardiac Rhythm:  [-] Normal sinus rhythm (02/25 0600) Resp:  [10-24] 16 (02/25 0600) BP: (79-112)/(48-71) 112/58 mmHg (02/25 0600) SpO2:  [91 %-100 %] 97 % (02/25 0600)  Hemodynamic parameters for last 24 hours:    Intake/Output from previous day: 02/24 0701 - 02/25 0700 In: 2514 [P.O.:240; I.V.:1040; IV Piggyback:1234] Out: 1970 [Urine:1870; Drains:100] Intake/Output this shift:    General appearance: alert and mild distress Neurologic: intact Heart: regular rate and rhythm Lungs: clear to auscultation bilaterally Extremities: r knee swollen, tender, no erythema Wound: VAC in place  Lab Results:  Recent Labs  05/14/13 1635 05/16/13 0354  WBC 21.4* 18.9*  HGB 8.9* 8.3*  HCT 27.8* 25.6*  PLT 580* 476*   BMET:  Recent Labs  05/14/13 2040 05/16/13 0354  NA 134* 134*  K 4.7 4.2  CL 96 96  CO2 24 26  GLUCOSE 117* 87  BUN 16 7  CREATININE 0.67 0.69  CALCIUM 9.0 8.4    PT/INR:  Recent Labs  05/14/13 2240  LABPROT 15.8*  INR 1.29   ABG    Component Value Date/Time   PHART 7.384 04/20/2013 2014   HCO3 23.6 04/20/2013 2014   TCO2 27 04/21/2013 1700   ACIDBASEDEF 1.0 04/20/2013 2014   O2SAT 99.0 04/20/2013 2014   CBG (last 3)  No results found for this basename: GLUCAP,  in the last 72 hours  Assessment/Plan: S/P Procedure(s) (LRB): STERNAL WOUND DEBRIDEMENT (N/A) APPLICATION OF WOUND VAC (N/A) - Sternal wound infection- No organism id'ed yet- GNR on gram stain  Continue  vanco and levaquin pending culture results  To OR today for washout and VAC change  R knee swelling- no erythema or warmth- suspect arthritis related, doubt septic joint  Followed by Dr. Marlou Sa  Will treat with NSAIDs for now   LOS: 3 days    Troy [name] 05/17/2013

## 2013-05-17 NOTE — Anesthesia Postprocedure Evaluation (Signed)
  Anesthesia Post-op Note  Patient: Troy [name]  Procedure(s) Performed: Procedure(s): STERNAL WOUND DEBRIDEMENT (N/A) WOUND VAC CHANGE (N/A)  Patient Location: PACU  Anesthesia Type:General  Level of Consciousness: awake  Airway and Oxygen Therapy: Patient Spontanous Breathing  Post-op Pain: mild  Post-op Assessment: Post-op Vital signs reviewed  Post-op Vital Signs: Reviewed  Complications: No apparent anesthesia complications

## 2013-05-17 NOTE — Interval H&P Note (Signed)
History and Physical Interval Note:  05/17/2013 2:15 PM  Troy [name]  has presented today for surgery, with the diagnosis of STERNAL INFECTION  The various methods of treatment have been discussed with the patient and family. After consideration of risks, benefits and other options for treatment, the patient has consented to  Procedure(s): STERNAL WOUND DEBRIDEMENT (N/A) WOUND VAC CHANGE (N/A) as a surgical intervention .  The patient's history has been reviewed, patient examined, no change in status, stable for surgery.  I have reviewed the patient's chart and labs.  Questions were answered to the patient's satisfaction.     Addison Whidbee C

## 2013-05-17 NOTE — Progress Notes (Addendum)
[facility] BRIEF SICU PROGRESS NOTE  Day of Surgery  S/P Procedure(s) (LRB): STERNAL WOUND DEBRIDEMENT (N/A) WOUND VAC CHANGE (N/A)   Awake and alert, NSR w/ stable BP Moderate soreness in chest Doesn't want morphine, fentanyl affective in PACU  Plan: Will order fentanyl as needed  Troy [name] H 05/17/2013 6:22 PM

## 2013-05-17 NOTE — Progress Notes (Signed)
2 Days Post-Op Procedure(s) (LRB): STERNAL WOUND DEBRIDEMENT (N/A) APPLICATION OF WOUND VAC (N/A) Subjective: C/o right knee pain- swelled up and started hurting after walk last night. Happens a few times a year and says TKR has been recommended. Usually takes ibuprofen adn sometimes steroids for the pain  No difficulty breathing  Objective: Vital signs in last 24 hours: Temp:  [97.6 F (36.4 C)-98.9 F (37.2 C)] 98.9 F (37.2 C) (02/25 0741) Pulse Rate:  [71-110] 87 (02/25 0600) Cardiac Rhythm:  [-] Normal sinus rhythm (02/25 0600) Resp:  [10-24] 16 (02/25 0600) BP: (79-112)/(48-71) 112/58 mmHg (02/25 0600) SpO2:  [91 %-100 %] 97 % (02/25 0600)  Hemodynamic parameters for last 24 hours:    Intake/Output from previous day: 02/24 0701 - 02/25 0700 In: 2514 [P.O.:240; I.V.:1040; IV Piggyback:1234] Out: 1970 [Urine:1870; Drains:100] Intake/Output this shift:    General appearance: alert and mild distress Neurologic: intact Heart: regular rate and rhythm Lungs: clear to auscultation bilaterally Extremities: r knee swollen, tender, no erythema Wound: VAC in place  Lab Results:  Recent Labs  05/14/13 1635 05/16/13 0354  WBC 21.4* 18.9*  HGB 8.9* 8.3*  HCT 27.8* 25.6*  PLT 580* 476*   BMET:  Recent Labs  05/14/13 2040 05/16/13 0354  NA 134* 134*  K 4.7 4.2  CL 96 96  CO2 24 26  GLUCOSE 117* 87  BUN 16 7  CREATININE 0.67 0.69  CALCIUM 9.0 8.4    PT/INR:  Recent Labs  05/14/13 2240  LABPROT 15.8*  INR 1.29   ABG    Component Value Date/Time   PHART 7.384 04/20/2013 2014   HCO3 23.6 04/20/2013 2014   TCO2 27 04/21/2013 1700   ACIDBASEDEF 1.0 04/20/2013 2014   O2SAT 99.0 04/20/2013 2014   CBG (last 3)  No results found for this basename: GLUCAP,  in the last 72 hours  Assessment/Plan: S/P Procedure(s) (LRB): STERNAL WOUND DEBRIDEMENT (N/A) APPLICATION OF WOUND VAC (N/A) - Sternal wound infection- No organism id'ed yet- GNR on gram stain  Continue  vanco and levaquin pending culture results  To OR today for washout and VAC change  R knee swelling- no erythema or warmth- suspect arthritis related, doubt septic joint  Followed by Dr. Marlou Sa  Will treat with NSAIDs for now   LOS: 3 days    HENDRICKSON,STEVEN C 05/17/2013

## 2013-05-17 NOTE — Progress Notes (Signed)
Dr Roxan Hockey updated pt sbp 80s with MAP 58-61, HR 70s. Pt resting comfortably in bed. LR @ 100cc/hr. Will continue to monitor. Troy [name]

## 2013-05-17 NOTE — Brief Op Note (Signed)
05/14/2013 - 05/17/2013  4:04 PM  PATIENT:  Jeneen Rinks Devaux  73 y.o. male  PRE-OPERATIVE DIAGNOSIS:  STERNAL INFECTION  POST-OPERATIVE DIAGNOSIS:  STERNAL INFECTION  PROCEDURE:  Procedure(s): STERNAL WOUND DEBRIDEMENT (N/A) WOUND VAC CHANGE (N/A)  SURGEON:  Surgeon(s) and Role: Panel 1:    * Melrose Nakayama, MD - Primary   ASSISTANTS: none   ANESTHESIA:   general  EBL:  Total I/O In: 1830 [P.O.:30; I.V.:1150; IV Piggyback:650] Out: 300 [Urine:250; Drains:50]  BLOOD ADMINISTERED:none  DRAINS: Blake drain in Mediastinum, VAC in wound   LOCAL MEDICATIONS USED:  NONE  SPECIMEN:  No Specimen  DISPOSITION OF SPECIMEN:  N/A  COUNTS:  YES  PLAN OF CARE: Continue inpatient  PATIENT DISPOSITION:  PACU - hemodynamically stable.   Delay start of Pharmacological VTE agent (>24hrs) due to surgical blood loss or risk of bleeding: no

## 2013-05-17 NOTE — Anesthesia Preprocedure Evaluation (Addendum)
Anesthesia Evaluation  Patient identified by MRN, date of birth, ID band Patient awake    Reviewed: Allergy & Precautions, H&P , NPO status , Patient's Chart, lab work & pertinent test results, reviewed documented beta blocker date and time   Airway Mallampati: I TM Distance: >3 FB Neck ROM: Full    Dental  (+) Edentulous Upper, Edentulous Lower, Dental Advisory Given   Pulmonary shortness of breath, sleep apnea , COPDformer smoker,  breath sounds clear to auscultation        Cardiovascular hypertension, Pt. on medications + angina + CAD and + CABG Rhythm:Regular     Neuro/Psych    GI/Hepatic negative GI ROS, Neg liver ROS,   Endo/Other    Renal/GU Renal disease     Musculoskeletal   Abdominal   Peds  Hematology   Anesthesia Other Findings   Reproductive/Obstetrics                         Anesthesia Physical Anesthesia Plan  ASA: IV  Anesthesia Plan: General   Post-op Pain Management:    Induction: Intravenous  Airway Management Planned: Oral ETT  Additional Equipment:   Intra-op Plan:   Post-operative Plan: Possible Post-op intubation/ventilation  Informed Consent: I have reviewed the patients History and Physical, chart, labs and discussed the procedure including the risks, benefits and alternatives for the proposed anesthesia with the patient or authorized representative who has indicated his/her understanding and acceptance.   Dental advisory given  Plan Discussed with: CRNA, Anesthesiologist and Surgeon  Anesthesia Plan Comments:         Anesthesia Quick Evaluation

## 2013-05-17 NOTE — Transfer of Care (Signed)
Immediate Anesthesia Transfer of Care Note  Patient: Troy [name]  Procedure(s) Performed: Procedure(s): STERNAL WOUND DEBRIDEMENT (N/A) WOUND VAC CHANGE (N/A)  Patient Location: PACU  Anesthesia Type:General  Level of Consciousness: awake  Airway & Oxygen Therapy: Patient Spontanous Breathing and Patient connected to face mask oxygen  Post-op Assessment: Report given to PACU RN and Post -op Vital signs reviewed and stable  Post vital signs: Reviewed and stable  Complications: No apparent anesthesia complications

## 2013-05-18 ENCOUNTER — Inpatient Hospital Stay (HOSPITAL_COMMUNITY): Payer: Medicare Other

## 2013-05-18 LAB — PREPARE RBC (CROSSMATCH)

## 2013-05-18 LAB — CBC
HCT: 22.5 % — ABNORMAL LOW (ref 39.0–52.0)
Hemoglobin: 7.3 g/dL — ABNORMAL LOW (ref 13.0–17.0)
MCH: 28 pg (ref 26.0–34.0)
MCHC: 32.4 g/dL (ref 30.0–36.0)
MCV: 86.2 fL (ref 78.0–100.0)
Platelets: 440 10*3/uL — ABNORMAL HIGH (ref 150–400)
RBC: 2.61 MIL/uL — ABNORMAL LOW (ref 4.22–5.81)
RDW: 16.9 % — AB (ref 11.5–15.5)
WBC: 14.5 10*3/uL — ABNORMAL HIGH (ref 4.0–10.5)

## 2013-05-18 LAB — BASIC METABOLIC PANEL
BUN: 8 mg/dL (ref 6–23)
CALCIUM: 8.4 mg/dL (ref 8.4–10.5)
CO2: 25 mEq/L (ref 19–32)
Chloride: 103 mEq/L (ref 96–112)
Creatinine, Ser: 1.68 mg/dL — ABNORMAL HIGH (ref 0.50–1.35)
GFR, EST AFRICAN AMERICAN: 46 mL/min — AB (ref >90)
GFR, EST NON AFRICAN AMERICAN: 39 mL/min — AB (ref >90)
GLUCOSE: 115 mg/dL — AB (ref 70–99)
POTASSIUM: 4 meq/L (ref 3.7–5.3)
Sodium: 139 mEq/L (ref 137–147)

## 2013-05-18 MED ORDER — LEVOFLOXACIN IN D5W 750 MG/150ML IV SOLN
750.0000 mg | INTRAVENOUS | Status: DC
  Administered 2013-05-19 – 2013-05-21 (×2): 750 mg via INTRAVENOUS
  Filled 2013-05-18 (×3): qty 150

## 2013-05-18 MED ORDER — VANCOMYCIN HCL 10 G IV SOLR
1500.0000 mg | INTRAVENOUS | Status: DC
  Administered 2013-05-18: 1500 mg via INTRAVENOUS
  Filled 2013-05-18 (×2): qty 1500

## 2013-05-18 MED ORDER — FUROSEMIDE 10 MG/ML IJ SOLN
40.0000 mg | Freq: Once | INTRAMUSCULAR | Status: AC
  Administered 2013-05-18: 40 mg via INTRAVENOUS
  Filled 2013-05-18: qty 4

## 2013-05-18 NOTE — Consult Note (Signed)
ORTHOPAEDIC CONSULTATION  REQUESTING PHYSICIAN: Melrose Nakayama, MD  Chief Complaint: "My knee just flared up on Monday"  HPI: Troy [name] is a 73 y.o. retired male who complains of right knee pain for the past 8 years.  He states that he had a "flare up" this past Monday and the pain has been constant and throbbing in nature ever since.  Pain is located throughout entire knee, but is worse posteriorly.  He has start-up and endurance pain.  He occasionally has locking, popping, and catching, but there is often a feeling of instability and weakness.  He has tried Ibuprofen with minimal relief.  He currently sees a pain clinic for back issues and takes hydrocodone on a regular basis which he states only helps relieve his back pain.  He has had recent fever and chills and is currently being hospitalized for a post-op infection from CABG about one month ago.  He denies any previous injury, trauma, surgery or hospitalization for his right knee.  No history of hip pain or surgery.  However, he states that he has had his knee aspirated and injected with cortisone in the past.  His most recent cortisone injection was at the end of this past summer which gave him approximately 6 months relief.     Past Medical History  Diagnosis Date  . CAD (coronary artery disease)   . HTN (hypertension)   . COPD (chronic obstructive pulmonary disease)   . Hyperlipidemia   . Depression   . Vitamin D deficiency   . Sleep apnea   . Anginal pain   . Shortness of breath   . Arthritis   . Polycystic kidney disease    Past Surgical History  Procedure Laterality Date  . Cholecystectomy    . Hernia repair      x 11  . Angioplasty    . Cardiac catheterization      multiple  . Carpal tunnel release Right   . Spinal cord stimulator implant    . Back surgery    . Vagotomy    . Appendectomy    . Gastrectomy    . Nissen fundoplication    . Coronary artery bypass graft N/A 04/20/2013    Procedure: OFF PUMP  CORONARY ARTERY BYPASS GRAFTING (CABG);  Surgeon: Melrose Nakayama, MD;  Location: [place];  Service: Open Heart Surgery;  Laterality: N/A;  CABG X 1  POSSIBLE OFF PUMP  . Intraoperative transesophageal echocardiogram N/A 04/20/2013    Procedure: INTRAOPERATIVE TRANSESOPHAGEAL ECHOCARDIOGRAM;  Surgeon: Melrose Nakayama, MD;  Location: [place];  Service: Open Heart Surgery;  Laterality: N/A;  . Lobectomy  04/20/2013    Procedure: LOBECTOMY;  Surgeon: Melrose Nakayama, MD;  Location: [place];  Service: Open Heart Surgery;;  Right Upper Lobectomy  . Sternal wound debridement N/A 05/15/2013    Procedure: STERNAL WOUND DEBRIDEMENT;  Surgeon: Melrose Nakayama, MD;  Location: [place];  Service: Thoracic;  Laterality: N/A;  . Application of wound vac N/A 05/15/2013    Procedure: APPLICATION OF WOUND VAC;  Surgeon: Melrose Nakayama, MD;  Location: [facility];  Service: Thoracic;  Laterality: N/A;   History   Social History  . Marital Status: Widowed    Spouse Name: N/A    Number of Children: 0  . Years of Education: N/A   Occupational History  . Retired    Social History Main Topics  . Smoking status: Former Smoker -- 1.00 packs/day for 25 years  Types: Cigarettes    Quit date: 03/23/2002  . Smokeless tobacco: Never Used  . Alcohol Use: No  . Drug Use: No  . Sexual Activity: None   Other Topics Concern  . None   Social History Narrative  . None   Family History  Problem Relation Age of Onset  . Lung cancer Mother     worked at a [employer]  . Heart disease Mother   . Stomach cancer Maternal Grandmother   . Brain cancer Father    Allergies  Allergen Reactions  . Augmentin [Amoxicillin-Pot Clavulanate] Other (See Comments)    "passed out" when taking augmentin and flagyl together  . Flagyl [Metronidazole] Other (See Comments)    "passed out" when taking augmentin and flagyl together  . Tape Other (See Comments)    Blisters (need to use paper tape)   Prior to  Admission medications   Medication Sig Start Date End Date Taking? Authorizing Provider  albuterol (PROAIR HFA) 108 (90 BASE) MCG/ACT inhaler Inhale 1-2 puffs into the lungs every 6 (six) hours as needed for wheezing or shortness of breath.    Yes Historical Provider, MD  aspirin 325 MG EC tablet Take 325 mg by mouth daily as needed (angina).   Yes Historical Provider, MD  atorvastatin (LIPITOR) 20 MG tablet Take 20 mg by mouth daily.   Yes Historical Provider, MD  diazepam (VALIUM) 10 MG tablet Take 10 mg by mouth 2 (two) times daily as needed for anxiety (for back spasms).   Yes Historical Provider, MD  guaiFENesin (MUCINEX) 600 MG 12 hr tablet Take 600 mg by mouth 2 (two) times daily as needed for cough or to loosen phlegm.   Yes Historical Provider, MD  HYDROcodone-acetaminophen (NORCO) 10-325 MG per tablet Take 1 tablet by mouth every 6 (six) hours as needed for moderate pain.  01/28/13  Yes Historical Provider, MD  ibuprofen (ADVIL,MOTRIN) 200 MG tablet Take 200 mg by mouth daily as needed for mild pain.    Yes Historical Provider, MD  montelukast (SINGULAIR) 10 MG tablet Take 10 mg by mouth daily.  01/28/13  Yes Historical Provider, MD  traMADol (ULTRAM) 50 MG tablet Take 100 mg by mouth 3 (three) times daily as needed (pain).  01/28/13  Yes Historical Provider, MD  vitamin B-12 (CYANOCOBALAMIN) 1000 MCG tablet Take 1,000 mcg by mouth daily.   Yes Historical Provider, MD  vitamin E 400 UNIT capsule Take 400 Units by mouth daily.   Yes Historical Provider, MD  zolpidem (AMBIEN) 10 MG tablet Take 10 mg by mouth at bedtime as needed for sleep.  12/28/12  Yes Historical Provider, MD   Dg Knee 1-2 Views Right  05/18/2013   CLINICAL DATA:  Right knee pain and swelling, no known injury  EXAM: RIGHT KNEE - 1-2 VIEW  COMPARISON:  12/16/2006  FINDINGS: Two views of right knee submitted. No acute fracture. Small joint effusion. There is progression of degenerative changes with significant narrowing of medial  joint compartment. There is sclerosis of medial tibial plateau and medial femoral condyle. Mild medial subluxation about 5 mm of distal femur on tibial plateau. Mild spurring of medial tibial plateau. Mild chondrocalcinosis.  IMPRESSION: No acute fracture. Mild chondrocalcinosis. Small joint effusion. Progression of osteoarthritic changes as described above.   Electronically Signed   By: Lahoma Crocker M.D.   On: 05/18/2013 16:39   Dg Chest Port 1 View  05/18/2013   CLINICAL DATA:  Sternal wound infection.  EXAM: PORTABLE CHEST -  1 VIEW  COMPARISON:  DG CHEST 1V PORT dated 05/16/2013; DG CHEST 2 VIEW dated 04/18/2013; NM PET IMAGE INITIAL (PI) SKULL BASE TO THIGH dated 03/14/2013; CT ANGIO CHEST W/CM &/OR WO/CM dated 04/28/2013  FINDINGS: Right-sided subclavian line unchanged. Prior median sternotomy. Patient rotated to the right. Mild cardiomegaly. No pleural effusion or pneumothorax. Hyperinflation. Diffuse interstitial thickening. Left perihilar airspace disease is similar. Similar atelectasis at the left lung base.  IMPRESSION: No significant change since the prior exam.  Left perihilar atelectasis or infection, similar.  Cardiomegaly and COPD/ chronic bronchitis.   Electronically Signed   By: Abigail Miyamoto M.D.   On: 05/18/2013 08:10    Positive ROS: All other systems have been reviewed and were otherwise negative with the exception of those mentioned in the HPI and as above.  Labs cbc  Recent Labs  05/16/13 0354 05/18/13 0500  WBC 18.9* 14.5*  HGB 8.3* 7.3*  HCT 25.6* 22.5*  PLT 476* 440*    Labs inflam No results found for this basename: ESR, CRP,  in the last 72 hours  Labs coag No results found for this basename: INR, PT, PTT,  in the last 72 hours   Recent Labs  05/16/13 0354 05/18/13 0500  NA 134* 139  K 4.2 4.0  CL 96 103  CO2 26 25  GLUCOSE 87 115*  BUN 7 8  CREATININE 0.69 1.68*  CALCIUM 8.4 8.4    Physical Exam: Filed Vitals:   05/18/13 1600  BP: 93/55  Pulse: 92    Temp:   Resp: 17   General: Alert, no acute distress Cardiovascular: No pedal edema Respiratory: No cyanosis, no use of accessory musculature GI: No organomegaly, abdomen is soft and non-tender Skin: No lesions in the area of chief complaint Neurologic: Sensation intact distally Psychiatric: Patient is competent for consent with normal mood and affect Lymphatic: No axillary or cervical lymphadenopathy  MUSCULOSKELETAL:  Medial and lateral joint line tenderness TTP posterior knee  1+ joint effusion No erythema No calf tenderness bilaterally  Limited ROM about the right knee due to pain and inability to move from supine position EHL/FHL firing Neurovascularly intact distally  Other extremities are atraumatic with painless ROM and NVI.  Assessment: Most likely degenerative joint disease  Plan: Recommend NSAIDs  Consider corticosteroid injection in the future once infection has resolved Follow plan per medicine Ortho signing off.  Please call for questions.   Larae Grooms, PA-C Cell [phone]   05/18/2013 4:54 PM

## 2013-05-18 NOTE — Progress Notes (Addendum)
Patient ID: Troy [name], male   DOB: Feb 19, 1941, 73 y.o.   MRN: [id number] EVENING ROUNDS NOTE :     [address]       [place] [address]             [phone]                 1 Day Post-Op Procedure(s) (LRB): STERNAL WOUND DEBRIDEMENT (N/A) WOUND VAC CHANGE (N/A)  Total Length of Stay:  LOS: 4 days  BP 97/54  Pulse 99  Temp(Src) 98.5 F (36.9 C) (Oral)  Resp 15  Ht 6' (1.829 m)  Wt 156 lb 8.4 oz (71 kg)  BMI 21.22 kg/m2  SpO2 95%  .Intake/Output     02/25 0701 - 02/26 0700 02/26 0701 - 02/27 0700   P.O. 270 500   I.V. (mL/kg) 2600 (36.6) 1000 (14.1)   Blood  335   IV Piggyback 1400    Total Intake(mL/kg) 4270 (60.1) 1835 (25.8)   Urine (mL/kg/hr) 1430 (0.8) 1065 (1.4)   Drains 100 (0.1) 100 (0.1)   Total Output 1530 1165   Net +2740 +670          . lactated ringers 100 mL/hr at 05/18/13 1452     Lab Results  Component Value Date   WBC 14.5* 05/18/2013   HGB 7.3* 05/18/2013   HCT 22.5* 05/18/2013   PLT 440* 05/18/2013   GLUCOSE 115* 05/18/2013   ALT 12 04/18/2013   AST 17 04/18/2013   NA 139 05/18/2013   K 4.0 05/18/2013   CL 103 05/18/2013   CREATININE 1.68* 05/18/2013   BUN 8 05/18/2013   CO2 25 05/18/2013   INR 1.29 05/14/2013   HGBA1C 6.2* 04/18/2013   Stable day, for dressing change  Tomorrow Anemia- got one unit of prbc's Acute Kidney Injury (any one)  Increase in SCr by > 0.3 within 48 hours  Increase SCr to > 1.5 times baseline  Urine volume < 0.5 ml/kg/h for 6 hrs  Stage:  Risk:   1.5x increase in creatinine or GFR decrease by 25% or UOP <0.51ml/kgperhr for 6 hrs  Injury:  2x increase in creatinine or GFR decrease by 50% or UOP < 0.2ml/kgperhr for 12 hr  Failure:3X increase in creatinine or GFR decrease by 75% or UOP < 0.71ml/kgperhr for 12 hr or                anuria 12 hrs  Loss: complete loss of kidney  function for more then 4 weeks  End-stage renal disease:Complete loss of kidney function for more then 3 months  Lab Results   Component Value Date   CREATININE 1.68* 05/18/2013   CREATININE: 1.68 mg/dL ABNORMAL (05/18/13 0500) Estimated creatinine clearance - Cockcroft-Gault CrCl: 40.5 mL/min   Grace Isaac MD  Beeper [phone] Office [phone] 05/18/2013 5:56 PM

## 2013-05-18 NOTE — Progress Notes (Signed)
Pt's BPs 70s/40s, maps 47-52.  Pt sleeping, but arouses easily.  Dr. Roxy Manns notified, orders received. Will continue to monitor.  Vista Lawman, RN

## 2013-05-18 NOTE — Op Note (Signed)
NAMEGORDAN, GRELL NO.:  [id number]  MEDICAL RECORD NO.:  [id number]  LOCATION:  2S06C                        FACILITY:  [facility]  PHYSICIAN:  Revonda Standard. Roxan Hockey, M.D.DATE OF BIRTH:  Jan 14, 1941  DATE OF PROCEDURE:  05/17/2013 DATE OF DISCHARGE:                              OPERATIVE REPORT   PREOPERATIVE DIAGNOSIS:  Sternal wound infection status post debridement and VAC placement.  POSTOPERATIVE DIAGNOSIS:  Sternal wound infection status post debridement and VAC placement.  PROCEDURE:  VAC change of sternal wound with sternal debridement.  SURGEON:  Revonda Standard. Roxan Hockey, M.D.  ANESTHESIA:  General.  FINDINGS:  Majority of tissue granulating, some exposed costal cartilage was debrided.  Minimal additional debridement of the lower portion of the sternum, upper portion of the sternum still stable.  CLINICAL NOTE:  Mr. Golladay is a 73 year old gentleman who had coronary artery bypass grafting and a right upper lobectomy done in January.  He presented back about a month postop with a sternal wound infection on May 15, 2013.  He was taken to the operating room for sternal debridement and VAC placement.  He now needs a VAC change and relook at the wound to see if additional debridement is necessary.  The indications, risks, benefits, and alternatives were discussed in detail with the patient.  He understood and accepted the risks and agreed to proceed.  OPERATIVE NOTE:  Mr. Tomerlin was brought to the operating room on May 17, 2013.  He was already receiving intravenous antibiotics with vancomycin and Levaquin.  No additional antibiotics were given.  He was anesthetized and intubated.  The VAC sponge was removed and the chest was prepped and draped in usual sterile fashion.  Inspection of the wound revealed some exudate at the base of the wound, but the majority of the tissue was viable and granulating.  There was some exposed costal cartilage  inferiorly on both sides, which was debrided back.  The wound was copiously irrigated with warm saline using a pulse lavage, 3 L of saline was used. Minimal additional devitalized tissue was debrided, but again approximately 90% of the tissue was healthy and granulating.  The Ursa drain was left in place.  A new VAC sponge was cut to fit the wound and the dressing was applied and vacuum was applied.  The patient tolerated the procedure well.  He was extubated in the operating room and taken to the postanesthetic care unit in good condition.    Revonda Standard Roxan Hockey, M.D.    SCH/MEDQ  D:  05/17/2013  T:  05/18/2013  Job:  [id number]

## 2013-05-18 NOTE — Progress Notes (Signed)
Pharmacy: Vancomycin  72yom continues on day #4 vancomycin and levaquin for sternal wound infection s/p I&D, VAC placement 2/24 and then [facility] exchange and further debridement yesterday. He has had a large jump in his sCr overnight 0.69-->1.68. UOP is also decreased. Will adjust antibiotic doses accordingly.  2/23 Vancomycin>> 2/23 Levaquin>> 2/23 sternum cx (on abx)>> GNR, final 2/23 tissue cx (on abx)>> negative, final 2/23 blood cx>> ngtd  Plan: 1) Change vancomycin to 1.5g IV q24 2) Change levaquin to 750mg  IV q48  Nena Jordan, PharmD, BCPS 05/18/2013, 9:26AM

## 2013-05-18 NOTE — Progress Notes (Addendum)
[facility] DAILY ICU PROGRESS NOTE                   [address]            [place] [address]          [phone]   1 Day Post-Op Procedure(s) (LRB): STERNAL WOUND DEBRIDEMENT (N/A) WOUND VAC CHANGE (N/A)  Total Length of Stay:  LOS: 4 days   Subjective: Moderate soreness of chest. Also right knee is painful and he says he cant put weight on it.  Objective: Vital signs in last 24 hours: Temp:  [97.2 F (36.2 C)-98.9 F (37.2 C)] 97.2 F (36.2 C) (02/26 0722) Pulse Rate:  [66-103] 66 (02/26 0600) Cardiac Rhythm:  [-] Normal sinus rhythm (02/26 0000) Resp:  [12-28] 14 (02/26 0700) BP: (75-136)/(41-71) 98/54 mmHg (02/26 0700) SpO2:  [94 %-100 %] 99 % (02/26 0600) Weight:  [156 lb 8.4 oz (71 kg)] 156 lb 8.4 oz (71 kg) (02/26 0600)  Filed Weights   05/14/13 1920 05/16/13 0600 05/18/13 0600  Weight: 142 lb 11.2 oz (64.728 kg) 146 lb 13.2 oz (66.6 kg) 156 lb 8.4 oz (71 kg)    Weight change:    Hemodynamic parameters for last 24 hours:    Intake/Output from previous day: 02/25 0701 - 02/26 0700 In: 4270 [P.O.:270; I.V.:2600; IV Piggyback:1400] Out: 1530 [Urine:1430; Drains:100]  Intake/Output this shift:    Current Meds: Scheduled Meds: . atorvastatin  20 mg Oral Daily  . enoxaparin (LOVENOX) injection  40 mg Subcutaneous Q24H  . feeding supplement (RESOURCE BREEZE)  1 Container Oral TID BM  . levofloxacin (LEVAQUIN) IV  750 mg Intravenous Q24H  . montelukast  10 mg Oral Daily  . pantoprazole  40 mg Oral Q1200  . vancomycin  1,500 mg Intravenous Q12H  . vitamin B-12  1,000 mcg Oral Daily  . vitamin E  400 Units Oral Daily   Continuous Infusions: . lactated ringers 100 mL/hr at 05/18/13 0700   PRN Meds:.acetaminophen, albuterol, alum & mag hydroxide-simeth, aspirin, diazepam, fentaNYL, guaiFENesin, HYDROcodone-acetaminophen, ibuprofen, oxyCODONE, traMADol, zolpidem  General appearance: alert, cooperative and no distress Heart: regular rate and  rhythm Lungs: clear BS Abdomen: soft, nontender Extremities: right knee with swelling/effusion Wound: vac in place, no erethems  Lab Results: CBC: Recent Labs  05/16/13 0354 05/18/13 0500  WBC 18.9* 14.5*  HGB 8.3* 7.3*  HCT 25.6* 22.5*  PLT 476* 440*   BMET:  Recent Labs  05/16/13 0354  NA 134*  K 4.2  CL 96  CO2 26  GLUCOSE 87  BUN 7  CREATININE 0.69  CALCIUM 8.4    PT/INR: No results found for this basename: LABPROT, INR,  in the last 72 hours Radiology: No results found.   Assessment/Plan: S/P Procedure(s) (LRB): STERNAL WOUND DEBRIDEMENT (N/A) WOUND VAC CHANGE (N/A) 1 stable with vac in place, conts vancomycin/levaquin. Leukocytosis improved/no fevers 2 looks fairly pale, HGB 7.3, may benefit from transfusion at this point 3 right knee effusion- chronic problem, on prn steroids, prob not a candidate for cortisone injections with sternal wound 4 cont pain meds/ mobilize as able   GOLD,WAYNE E 05/18/2013 7:28 AM  Patient seen and examined, agree with above Will ask Ortho to see re: right knee swelling and pain Severe protein calorie malnutrition- continue diet + Resource supplements Plan VAC change under sedation in ICU tomorrow

## 2013-05-19 ENCOUNTER — Other Ambulatory Visit: Payer: Self-pay | Admitting: *Deleted

## 2013-05-19 ENCOUNTER — Encounter (HOSPITAL_COMMUNITY): Payer: Self-pay | Admitting: Thoracic Surgery (Cardiothoracic Vascular Surgery)

## 2013-05-19 DIAGNOSIS — J449 Chronic obstructive pulmonary disease, unspecified: Secondary | ICD-10-CM

## 2013-05-19 DIAGNOSIS — I2510 Atherosclerotic heart disease of native coronary artery without angina pectoris: Secondary | ICD-10-CM

## 2013-05-19 DIAGNOSIS — Z48812 Encounter for surgical aftercare following surgery on the circulatory system: Secondary | ICD-10-CM

## 2013-05-19 LAB — TISSUE CULTURE
CULTURE: NO GROWTH
Culture: NO GROWTH

## 2013-05-19 LAB — BASIC METABOLIC PANEL
BUN: 10 mg/dL (ref 6–23)
BUN: 12 mg/dL (ref 6–23)
CALCIUM: 8.1 mg/dL — AB (ref 8.4–10.5)
CALCIUM: 8.4 mg/dL (ref 8.4–10.5)
CHLORIDE: 104 meq/L (ref 96–112)
CO2: 26 meq/L (ref 19–32)
CO2: 25 mEq/L (ref 19–32)
CREATININE: 2.27 mg/dL — AB (ref 0.50–1.35)
CREATININE: 2.26 mg/dL — AB (ref 0.50–1.35)
Chloride: 100 mEq/L (ref 96–112)
GFR calc non Af Amer: 27 mL/min — ABNORMAL LOW (ref >90)
GFR, EST AFRICAN AMERICAN: 32 mL/min — AB (ref >90)
GFR, EST AFRICAN AMERICAN: 32 mL/min — AB (ref >90)
GFR, EST NON AFRICAN AMERICAN: 27 mL/min — AB (ref >90)
Glucose, Bld: 116 mg/dL — ABNORMAL HIGH (ref 70–99)
Glucose, Bld: 100 mg/dL — ABNORMAL HIGH (ref 70–99)
Potassium: 3.7 mEq/L (ref 3.7–5.3)
Potassium: 3.9 mEq/L (ref 3.7–5.3)
SODIUM: 140 meq/L (ref 137–147)
Sodium: 138 mEq/L (ref 137–147)

## 2013-05-19 LAB — CBC
HCT: 23.3 % — ABNORMAL LOW (ref 39.0–52.0)
Hemoglobin: 7.5 g/dL — ABNORMAL LOW (ref 13.0–17.0)
MCH: 27.4 pg (ref 26.0–34.0)
MCHC: 32.2 g/dL (ref 30.0–36.0)
MCV: 85 fL (ref 78.0–100.0)
PLATELETS: 430 10*3/uL — AB (ref 150–400)
RBC: 2.74 MIL/uL — AB (ref 4.22–5.81)
RDW: 16.6 % — ABNORMAL HIGH (ref 11.5–15.5)
WBC: 12.4 10*3/uL — AB (ref 4.0–10.5)

## 2013-05-19 LAB — VANCOMYCIN, TROUGH: VANCOMYCIN TR: 33.1 ug/mL — AB (ref 10.0–20.0)

## 2013-05-19 LAB — PREPARE RBC (CROSSMATCH)

## 2013-05-19 MED ORDER — ENOXAPARIN SODIUM 30 MG/0.3ML ~~LOC~~ SOLN
30.0000 mg | SUBCUTANEOUS | Status: DC
  Administered 2013-05-19 – 2013-05-22 (×4): 30 mg via SUBCUTANEOUS
  Filled 2013-05-19 (×5): qty 0.3

## 2013-05-19 MED ORDER — FUROSEMIDE 10 MG/ML IJ SOLN
40.0000 mg | Freq: Once | INTRAMUSCULAR | Status: AC
  Administered 2013-05-19: 40 mg via INTRAVENOUS
  Filled 2013-05-19: qty 4

## 2013-05-19 MED ORDER — MIDAZOLAM HCL 2 MG/2ML IJ SOLN
INTRAMUSCULAR | Status: AC
  Filled 2013-05-19: qty 4

## 2013-05-19 MED ORDER — SODIUM CHLORIDE 0.9 % IV SOLN
INTRAVENOUS | Status: DC
  Administered 2013-05-19 – 2013-05-22 (×3): 20 mL/h via INTRAVENOUS
  Administered 2013-05-23: 15:00:00 via INTRAVENOUS

## 2013-05-19 MED ORDER — FENTANYL CITRATE 0.05 MG/ML IJ SOLN
150.0000 ug | Freq: Once | INTRAMUSCULAR | Status: AC
  Administered 2013-05-19: 150 ug via INTRAVENOUS

## 2013-05-19 MED ORDER — MIDAZOLAM HCL 2 MG/2ML IJ SOLN
4.0000 mg | Freq: Once | INTRAMUSCULAR | Status: AC
  Administered 2013-05-19: 4 mg via INTRAVENOUS

## 2013-05-19 MED ORDER — VITAMIN C 500 MG PO TABS
1000.0000 mg | ORAL_TABLET | Freq: Every day | ORAL | Status: DC
  Administered 2013-05-19 – 2013-05-31 (×12): 1000 mg via ORAL
  Filled 2013-05-19 (×13): qty 2

## 2013-05-19 NOTE — Consult Note (Signed)
Reason for Consult:sternal wound infection Referring Physician: Dr. Roxan Hockey  Location: [facility]  Troy [name] is an 73 y.o. male.  HPI: Patient readmitted one month post op from 1 V CABG to RCA using RIMA and right upper lobectomy for cancer. Underwent operative debridement where superior sternum noted to be hard in nature, healed and wires left in place. Caudal two wires removed and bone debrided to find fluid cavity substernal. Plastic surgery consulted for coverage.  Of note pt has hx polycystic kidney disease with normal Cr on admission and now 2.6. Cultures demonstrated GNR but have not grown and further organism. Surgical history significant for open CCY via midline incision, vagotomy, Nissen, and partial gastrectomy. Also has history ventral hernia with multiple repairs with mesh.   Past Medical History  Diagnosis Date  . CAD (coronary artery disease)   . HTN (hypertension)   . COPD (chronic obstructive pulmonary disease)   . Hyperlipidemia   . Depression   . Vitamin D deficiency   . Sleep apnea   . Anginal pain   . Shortness of breath   . Arthritis   . Polycystic kidney disease     Past Surgical History  Procedure Laterality Date  . Cholecystectomy    . Hernia repair      x 11  . Angioplasty    . Cardiac catheterization      multiple  . Carpal tunnel release Right   . Spinal cord stimulator implant    . Back surgery    . Vagotomy    . Appendectomy    . Gastrectomy    . Nissen fundoplication    . Coronary artery bypass graft N/A 04/20/2013    Procedure: OFF PUMP CORONARY ARTERY BYPASS GRAFTING (CABG);  Surgeon: Melrose Nakayama, MD;  Location: [facility];  Service: Open Heart Surgery;  Laterality: N/A;  CABG X 1  POSSIBLE OFF PUMP  . Intraoperative transesophageal echocardiogram N/A 04/20/2013    Procedure: INTRAOPERATIVE TRANSESOPHAGEAL ECHOCARDIOGRAM;  Surgeon: Melrose Nakayama, MD;  Location: [facility];  Service: Open Heart Surgery;  Laterality: N/A;  .  Lobectomy  04/20/2013    Procedure: LOBECTOMY;  Surgeon: Melrose Nakayama, MD;  Location: [facility];  Service: Open Heart Surgery;;  Right Upper Lobectomy  . Sternal wound debridement N/A 05/15/2013    Procedure: STERNAL WOUND DEBRIDEMENT;  Surgeon: Melrose Nakayama, MD;  Location: [facility];  Service: Thoracic;  Laterality: N/A;  . Application of wound vac N/A 05/15/2013    Procedure: APPLICATION OF WOUND VAC;  Surgeon: Melrose Nakayama, MD;  Location: [facility];  Service: Thoracic;  Laterality: N/A;  . Sternal wound debridement N/A 05/17/2013    Procedure: STERNAL WOUND DEBRIDEMENT;  Surgeon: Melrose Nakayama, MD;  Location: [facility];  Service: Thoracic;  Laterality: N/A;  . Application of wound vac N/A 05/17/2013    Procedure: WOUND VAC CHANGE;  Surgeon: Melrose Nakayama, MD;  Location: [facility] OR;  Service: Vascular;  Laterality: N/A;    Family History  Problem Relation Age of Onset  . Lung cancer Mother     worked at a [employer]  . Heart disease Mother   . Stomach cancer Maternal Grandmother   . Brain cancer Father     Social History:  reports that he quit smoking about 11 years ago. His smoking use included Cigarettes. He has a 25 pack-year smoking history. He has never used smokeless tobacco. He reports that he does not drink alcohol or use illicit drugs.  Allergies:  Allergies  Allergen Reactions  . Augmentin [Amoxicillin-Pot Clavulanate] Other (See Comments)    "passed out" when taking augmentin and flagyl together  . Flagyl [Metronidazole] Other (See Comments)    "passed out" when taking augmentin and flagyl together  . Tape Other (See Comments)    Blisters (need to use paper tape)    Medications: I have reviewed the patient's current medications.  Results for orders placed during the hospital encounter of 05/14/13 (from the past 48 hour(s))  CBC     Status: Abnormal   Collection Time    05/18/13  5:00 AM      Result Value Ref Range   WBC 14.5 (*) 4.0 - 10.5 K/uL    RBC 2.61 (*) 4.22 - 5.81 MIL/uL   Hemoglobin 7.3 (*) 13.0 - 17.0 g/dL   HCT 22.5 (*) 39.0 - 52.0 %   MCV 86.2  78.0 - 100.0 fL   MCH 28.0  26.0 - 34.0 pg   MCHC 32.4  30.0 - 36.0 g/dL   RDW 16.9 (*) 11.5 - 15.5 %   Platelets 440 (*) 150 - 400 K/uL  BASIC METABOLIC PANEL     Status: Abnormal   Collection Time    05/18/13  5:00 AM      Result Value Ref Range   Sodium 139  137 - 147 mEq/L   Potassium 4.0  3.7 - 5.3 mEq/L   Chloride 103  96 - 112 mEq/L   CO2 25  19 - 32 mEq/L   Glucose, Bld 115 (*) 70 - 99 mg/dL   BUN 8  6 - 23 mg/dL   Creatinine, Ser 1.68 (*) 0.50 - 1.35 mg/dL   Calcium 8.4  8.4 - 10.5 mg/dL   GFR calc non Af Amer 39 (*) >90 mL/min   GFR calc Af Amer 46 (*) >90 mL/min   Comment: (NOTE)     The eGFR has been calculated using the CKD EPI equation.     This calculation has not been validated in all clinical situations.     eGFR's persistently <90 mL/min signify possible Chronic Kidney     Disease.  PREPARE RBC (CROSSMATCH)     Status: None   Collection Time    05/18/13 11:00 AM      Result Value Ref Range   Order Confirmation ORDER PROCESSED BY BLOOD BANK    TYPE AND SCREEN     Status: None   Collection Time    05/18/13 11:00 AM      Result Value Ref Range   ABO/RH(D) O POS     Antibody Screen NEG     Sample Expiration 05/21/2013     Unit Number [id number]     Blood Component Type RED CELLS,LR     Unit division 00     Status of Unit ISSUED,FINAL     Transfusion Status OK TO TRANSFUSE     Crossmatch Result Compatible     Unit Number [id number]     Blood Component Type RED CELLS,LR     Unit division 00     Status of Unit ISSUED     Transfusion Status OK TO TRANSFUSE     Crossmatch Result Compatible    CBC     Status: Abnormal   Collection Time    05/19/13  4:05 AM      Result Value Ref Range   WBC 12.4 (*) 4.0 - 10.5 K/uL   RBC 2.74 (*) 4.22 - 5.81 MIL/uL  Hemoglobin 7.5 (*) 13.0 - 17.0 g/dL   HCT 23.3 (*) 39.0 - 52.0 %   MCV 85.0  78.0 -  100.0 fL   MCH 27.4  26.0 - 34.0 pg   MCHC 32.2  30.0 - 36.0 g/dL   RDW 16.6 (*) 11.5 - 15.5 %   Platelets 430 (*) 150 - 400 K/uL  BASIC METABOLIC PANEL     Status: Abnormal   Collection Time    05/19/13  4:05 AM      Result Value Ref Range   Sodium 140  137 - 147 mEq/L   Potassium 3.7  3.7 - 5.3 mEq/L   Chloride 104  96 - 112 mEq/L   CO2 26  19 - 32 mEq/L   Glucose, Bld 116 (*) 70 - 99 mg/dL   BUN 10  6 - 23 mg/dL   Creatinine, Ser 2.27 (*) 0.50 - 1.35 mg/dL   Calcium 8.1 (*) 8.4 - 10.5 mg/dL   GFR calc non Af Amer 27 (*) >90 mL/min   GFR calc Af Amer 32 (*) >90 mL/min   Comment: (NOTE)     The eGFR has been calculated using the CKD EPI equation.     This calculation has not been validated in all clinical situations.     eGFR's persistently <90 mL/min signify possible Chronic Kidney     Disease.  PREPARE RBC (CROSSMATCH)     Status: None   Collection Time    05/19/13  8:05 AM      Result Value Ref Range   Order Confirmation ORDER PROCESSED BY BLOOD BANK       ROS Blood pressure 93/55, pulse 79, temperature 97.4 F (36.3 C), temperature source Oral, resp. rate 15, height 6' (1.829 m), weight 71.4 kg (157 lb 6.5 oz), SpO2 96.00%. Physical Exam Alert VAC over mid to caudal chest no cellulitis Wound: granulated side walls and sternum, xiphoid debrided, scant drainage Abdomen distended NTTP tympanitic, midline scar healed RUQ Kocher scar no hernias noted  Assessment/Plan: Sternal wound infection in setting of COPD, lung cancer, rising Cr, and severe protein calorie malnutrition.  Prealbumin ordered, on supplements, start Vit C  Nephrology consulted for rising Cr  Given location of defect, will plan left rectus abdominus flap to fill defect. Plan final debridement at time of flap. Renal issues will need to stabilize prior to OR. Discussed with patient significant risks wound healing problems including donor site given comorbidities. There is also a risk that any abdominal  mesh from previous hernia repair may be seeded or risk infection with intervention in this area. OR plan for next week.   Irene Limbo, MD [facility] [phone]

## 2013-05-19 NOTE — Consult Note (Signed)
I participated in the care of this patient and agree with the above history, physical and evaluation. I performed a review of the history and a physical exam as detailed   Xray demonstrates mod/severe degenerative arthritis  Recommend NSAIDS, depomedrol articular injection when appropriate(infection cleared). Ice, elevation.  WBAT, f/u PRN with me.   Ortho signing off, please call with further quesitons  Carole Binning MD

## 2013-05-19 NOTE — Progress Notes (Signed)
ANTIBIOTIC CONSULT NOTE - FOLLOW UP  Pharmacy Consult for Vancomycin Indication: Sternal wound infection  Allergies  Allergen Reactions  . Augmentin [Amoxicillin-Pot Clavulanate] Other (See Comments)    "passed out" when taking augmentin and flagyl together  . Flagyl [Metronidazole] Other (See Comments)    "passed out" when taking augmentin and flagyl together  . Tape Other (See Comments)    Blisters (need to use paper tape)    Patient Measurements: Height: 6' (182.9 cm) Weight: 157 lb 6.5 oz (71.4 kg) IBW/kg (Calculated) : 77.6  Vital Signs: Temp: 98.7 F (37.1 C) (02/27 1541) Temp src: Oral (02/27 1541) BP: 95/59 mmHg (02/27 2100) Pulse Rate: 80 (02/27 2100) Intake/Output from previous day: 02/26 0701 - 02/27 0700 In: 3595 [P.O.:860; I.V.:1900; Blood:335; IV Piggyback:500] Out: 3090 [Urine:2940; Drains:150] Intake/Output from this shift:    Labs:  Recent Labs  05/18/13 0500 05/19/13 0405 05/19/13 1600  WBC 14.5* 12.4*  --   HGB 7.3* 7.5*  --   PLT 440* 430*  --   CREATININE 1.68* 2.27* 2.26*   Estimated Creatinine Clearance: 30.3 ml/min (by C-G formula based on Cr of 2.26).  Recent Labs  05/19/13 2000  [place] 33.1*     Microbiology: Recent Results (from the past 720 hour(s))  CULTURE, BLOOD (ROUTINE X 2)     Status: None   Collection Time    04/26/13  4:20 PM      Result Value Ref Range Status   Specimen Description BLOOD LEFT ARM   Final   Special Requests BOTTLES DRAWN AEROBIC AND ANAEROBIC 10CC   Final   Culture  Setup Time     Final   Value: 04/26/2013 22:15     Performed at [facility]   Culture     Final   Value: NO GROWTH 5 DAYS     Performed at [facility]   Report Status 05/02/2013 FINAL   Final  CULTURE, BLOOD (ROUTINE X 2)     Status: None   Collection Time    04/26/13  4:32 PM      Result Value Ref Range Status   Specimen Description BLOOD RIGHT HAND   Final   Special Requests     Final   Value: BOTTLES  DRAWN AEROBIC AND ANAEROBIC BLUE 10CC RED 5CC   Culture  Setup Time     Final   Value: 04/26/2013 22:15     Performed at [facility]   Culture     Final   Value: NO GROWTH 5 DAYS     Performed at [facility]   Report Status 05/02/2013 FINAL   Final  URINE CULTURE     Status: None   Collection Time    04/26/13  8:12 PM      Result Value Ref Range Status   Specimen Description URINE, CLEAN CATCH   Final   Special Requests ADDED [id number] [phone]   Final   Culture  Setup Time     Final   Value: 04/27/2013 13:06     Performed at [facility]     Final   Value: 30,000 COLONIES/ML     Performed at [facility]   Culture     Final   Value: Multiple bacterial morphotypes present, none predominant. Suggest appropriate recollection if clinically indicated.     Performed at [facility]   Report Status 04/28/2013 FINAL   Final  CULTURE, BLOOD (ROUTINE X 2)  Status: None   Collection Time    05/14/13  3:58 PM      Result Value Ref Range Status   Specimen Description BLOOD LEFT ARM   Final   Special Requests BOTTLES DRAWN AEROBIC AND ANAEROBIC 5CC   Final   Culture  Setup Time     Final   Value: 05/14/2013 19:47     Performed at [facility]   Culture     Final   Value:        BLOOD CULTURE RECEIVED NO GROWTH TO DATE CULTURE WILL BE HELD FOR 5 DAYS BEFORE ISSUING A FINAL NEGATIVE REPORT     Performed at [facility]   Report Status PENDING   Incomplete  WOUND CULTURE     Status: None   Collection Time    05/14/13  6:31 PM      Result Value Ref Range Status   Specimen Description WOUND CHEST   Final   Special Requests NONE   Final   Gram Stain     Final   Value: FEW WBC PRESENT, PREDOMINANTLY PMN     NO SQUAMOUS EPITHELIAL CELLS SEEN     FEW GRAM NEGATIVE RODS     Performed at [facility]   Culture     Final   Value: NO GROWTH 2 DAYS     Performed at [facility]   Report Status 05/17/2013  FINAL   Final  CULTURE, BLOOD (ROUTINE X 2)     Status: None   Collection Time    05/14/13  8:40 PM      Result Value Ref Range Status   Specimen Description BLOOD RIGHT ARM   Final   Special Requests BOTTLES DRAWN AEROBIC ONLY 4CC   Final   Culture  Setup Time     Final   Value: 05/15/2013 00:45     Performed at [facility]   Culture     Final   Value:        BLOOD CULTURE RECEIVED NO GROWTH TO DATE CULTURE WILL BE HELD FOR 5 DAYS BEFORE ISSUING A FINAL NEGATIVE REPORT     Performed at [facility]   Report Status PENDING   Incomplete  WOUND CULTURE     Status: None   Collection Time    05/15/13  2:49 PM      Result Value Ref Range Status   Specimen Description WOUND STERNUM   Final   Special Requests NO 1 PT ON VANCO LAVEQUIN   Final   Gram Stain     Final   Value: MODERATE WBC PRESENT, PREDOMINANTLY PMN     NO SQUAMOUS EPITHELIAL CELLS SEEN     MODERATE GRAM NEGATIVE RODS     Performed at [facility]   Culture     Final   Value: NO GROWTH 2 DAYS     Performed at [facility]   Report Status 05/17/2013 FINAL   Final  ANAEROBIC CULTURE     Status: None   Collection Time    05/15/13  2:49 PM      Result Value Ref Range Status   Specimen Description WOUND STERNUM   Final   Special Requests NO 1 PT ON VANCO LAVEQUIN   Final   Gram Stain     Final   Value: MODERATE WBC PRESENT, PREDOMINANTLY PMN     NO SQUAMOUS EPITHELIAL CELLS SEEN     MODERATE GRAM NEGATIVE RODS  Performed at [facility]     Final   Value: NO ANAEROBES ISOLATED; CULTURE IN PROGRESS FOR 5 DAYS     Performed at [facility]   Report Status PENDING   Incomplete  TISSUE CULTURE     Status: None   Collection Time    05/15/13  2:54 PM      Result Value Ref Range Status   Specimen Description TISSUE STERNUM   Final   Special Requests NO 2 PT ON VANCO LAVEQUIN   Final   Gram Stain     Final   Value: MODERATE WBC PRESENT, PREDOMINANTLY PMN      NO ORGANISMS SEEN     Performed at [facility]   Culture     Final   Value: NO GROWTH 3 DAYS     Performed at [facility]   Report Status 05/19/2013 FINAL   Final  TISSUE CULTURE     Status: None   Collection Time    05/15/13  3:03 PM      Result Value Ref Range Status   Specimen Description TISSUE STERNUM   Final   Special Requests NO 3 STERNUM BONE PT ON VANCO LEVEQUIN   Final   Gram Stain     Final   Value: RARE WBC PRESENT, PREDOMINANTLY PMN     NO ORGANISMS SEEN     Performed at [facility]   Culture     Final   Value: NO GROWTH 3 DAYS     Performed at [facility]   Report Status 05/19/2013 FINAL   Final    Anti-infectives   Start     Dose/Rate Route Frequency Ordered Stop   05/19/13 1400  levofloxacin (LEVAQUIN) IVPB 750 mg     750 mg 100 mL/hr over 90 Minutes Intravenous Every 48 hours 05/18/13 0928     05/18/13 2100  vancomycin (VANCOCIN) 1,500 mg in sodium chloride 0.9 % 500 mL IVPB  Status:  Discontinued     1,500 mg 250 mL/hr over 120 Minutes Intravenous Every 24 hours 05/18/13 0922 05/19/13 1557   05/16/13 2130  vancomycin (VANCOCIN) 1,500 mg in sodium chloride 0.9 % 500 mL IVPB  Status:  Discontinued     1,500 mg 250 mL/hr over 120 Minutes Intravenous Every 12 hours 05/16/13 2106 05/18/13 0922   05/16/13 1400  levofloxacin (LEVAQUIN) IVPB 750 mg  Status:  Discontinued     750 mg 100 mL/hr over 90 Minutes Intravenous Every 24 hours 05/15/13 1828 05/18/13 0928   05/15/13 1445  levofloxacin (LEVAQUIN) IVPB 500 mg  Status:  Discontinued     500 mg 100 mL/hr over 60 Minutes Intravenous To Surgery 05/15/13 1434 05/15/13 1828   05/14/13 2100  vancomycin (VANCOCIN) 1,250 mg in sodium chloride 0.9 % 250 mL IVPB  Status:  Discontinued     1,250 mg 166.7 mL/hr over 90 Minutes Intravenous Every 12 hours 05/14/13 2005 05/16/13 2106      Assessment: 70 yom on vancomycin day 5 for sternal wound infection s/p I&D and VAC placement. He was  taken back to the OR for further debridement and VAC exchange on 2/25. He is febrile with Tmax 100.6, WBC are elevated but trending down. Cultures are negative although gram stain in wound culture showed moderate GNR. Vanc level came back elevated tonight. Renal has dced vanc and planning on ID consult for opinion.   Goal of Therapy:  Vancomycin trough level 15-20 mcg/ml  Plan:   Vanc dced Will f/u to see if it's still warranted after ID sees

## 2013-05-19 NOTE — Consult Note (Signed)
Referring Provider: No ref. provider found Primary Care Physician:  Pcp Not In System Primary Nephrologist:   none  Reason for Consultation:  Acute non oliguric renal failure  HPI: post op from 1 V CABG to RCA using RIMA and right upper lobectomy for cancer, hx polycystic kidney disease with normal GFR on admission.Underwent operative debridement where superior sternum noted to be hard in nature, healed and wires left in place. Caudal two wires removed and bone debrided to find fluid cavity substernal. Plastic surgery consulted for coverage. No organism seen so far on Gram stain. No NSAIDS or ACE or ARB. Developed some early post operative hypotension with blood pressure less than 90 mm Hg . Using Vancomycin coverage as well as levoquin. Urinalysis initially unremarkable has started developing proteinuria on dipstick testing.   Past Medical History  Diagnosis Date  . CAD (coronary artery disease)   . HTN (hypertension)   . COPD (chronic obstructive pulmonary disease)   . Hyperlipidemia   . Depression   . Vitamin D deficiency   . Sleep apnea   . Anginal pain   . Shortness of breath   . Arthritis   . Polycystic kidney disease     Past Surgical History  Procedure Laterality Date  . Cholecystectomy    . Hernia repair      x 11  . Angioplasty    . Cardiac catheterization      multiple  . Carpal tunnel release Right   . Spinal cord stimulator implant    . Back surgery    . Vagotomy    . Appendectomy    . Gastrectomy    . Nissen fundoplication    . Coronary artery bypass graft N/A 04/20/2013    Procedure: OFF PUMP CORONARY ARTERY BYPASS GRAFTING (CABG);  Surgeon: Melrose Nakayama, MD;  Location: [place];  Service: Open Heart Surgery;  Laterality: N/A;  CABG X 1  POSSIBLE OFF PUMP  . Intraoperative transesophageal echocardiogram N/A 04/20/2013    Procedure: INTRAOPERATIVE TRANSESOPHAGEAL ECHOCARDIOGRAM;  Surgeon: Melrose Nakayama, MD;  Location: [place];  Service: Open Heart  Surgery;  Laterality: N/A;  . Lobectomy  04/20/2013    Procedure: LOBECTOMY;  Surgeon: Melrose Nakayama, MD;  Location: [place];  Service: Open Heart Surgery;;  Right Upper Lobectomy  . Sternal wound debridement N/A 05/15/2013    Procedure: STERNAL WOUND DEBRIDEMENT;  Surgeon: Melrose Nakayama, MD;  Location: [place];  Service: Thoracic;  Laterality: N/A;  . Application of wound vac N/A 05/15/2013    Procedure: APPLICATION OF WOUND VAC;  Surgeon: Melrose Nakayama, MD;  Location: [place];  Service: Thoracic;  Laterality: N/A;  . Sternal wound debridement N/A 05/17/2013    Procedure: STERNAL WOUND DEBRIDEMENT;  Surgeon: Melrose Nakayama, MD;  Location: [place];  Service: Thoracic;  Laterality: N/A;  . Application of wound vac N/A 05/17/2013    Procedure: WOUND VAC CHANGE;  Surgeon: Melrose Nakayama, MD;  Location: [place];  Service: Vascular;  Laterality: N/A;    Prior to Admission medications   Medication Sig Start Date End Date Taking? Authorizing Provider  albuterol (PROAIR HFA) 108 (90 BASE) MCG/ACT inhaler Inhale 1-2 puffs into the lungs every 6 (six) hours as needed for wheezing or shortness of breath.    Yes Historical Provider, MD  aspirin 325 MG EC tablet Take 325 mg by mouth daily as needed (angina).   Yes Historical Provider, MD  atorvastatin (LIPITOR) 20 MG tablet Take 20 mg by  mouth daily.   Yes Historical Provider, MD  diazepam (VALIUM) 10 MG tablet Take 10 mg by mouth 2 (two) times daily as needed for anxiety (for back spasms).   Yes Historical Provider, MD  guaiFENesin (MUCINEX) 600 MG 12 hr tablet Take 600 mg by mouth 2 (two) times daily as needed for cough or to loosen phlegm.   Yes Historical Provider, MD  HYDROcodone-acetaminophen (NORCO) 10-325 MG per tablet Take 1 tablet by mouth every 6 (six) hours as needed for moderate pain.  01/28/13  Yes Historical Provider, MD  ibuprofen (ADVIL,MOTRIN) 200 MG tablet Take 200 mg by mouth daily as needed for mild pain.    Yes  Historical Provider, MD  montelukast (SINGULAIR) 10 MG tablet Take 10 mg by mouth daily.  01/28/13  Yes Historical Provider, MD  traMADol (ULTRAM) 50 MG tablet Take 100 mg by mouth 3 (three) times daily as needed (pain).  01/28/13  Yes Historical Provider, MD  vitamin B-12 (CYANOCOBALAMIN) 1000 MCG tablet Take 1,000 mcg by mouth daily.   Yes Historical Provider, MD  vitamin E 400 UNIT capsule Take 400 Units by mouth daily.   Yes Historical Provider, MD  zolpidem (AMBIEN) 10 MG tablet Take 10 mg by mouth at bedtime as needed for sleep.  12/28/12  Yes Historical Provider, MD    Current Facility-Administered Medications  Medication Dose Route Frequency Provider Last Rate Last Dose  . acetaminophen (TYLENOL) tablet 650 mg  650 mg Oral Q6H PRN Melrose Nakayama, MD      . albuterol (PROVENTIL) (2.5 MG/3ML) 0.083% nebulizer solution 2.5 mg  2.5 mg Nebulization Q6H PRN Melrose Nakayama, MD      . alum & mag hydroxide-simeth (MAALOX/MYLANTA) 200-200-20 MG/5ML suspension 30 mL  30 mL Oral Q6H PRN Melrose Nakayama, MD      . aspirin EC tablet 325 mg  325 mg Oral Daily PRN Melrose Nakayama, MD   325 mg at 05/15/13 1012  . atorvastatin (LIPITOR) tablet 20 mg  20 mg Oral Daily Melrose Nakayama, MD   20 mg at 05/19/13 1028  . diazepam (VALIUM) tablet 10 mg  10 mg Oral BID PRN Melrose Nakayama, MD   10 mg at 05/17/13 2220  . enoxaparin (LOVENOX) injection 30 mg  30 mg Subcutaneous Q24H Melrose Nakayama, MD      . feeding supplement (RESOURCE BREEZE) (RESOURCE BREEZE) liquid 1 Container  1 Container Oral TID BM Dagmar Hait, RD   1 Container at 05/19/13 1000  . fentaNYL (SUBLIMAZE) injection 25 mcg  25 mcg Intravenous Q1H PRN Rexene Alberts, MD   25 mcg at 05/19/13 0009  . guaiFENesin (MUCINEX) 12 hr tablet 600 mg  600 mg Oral BID PRN Melrose Nakayama, MD      . HYDROcodone-acetaminophen [facility]) 10-325 MG per tablet 1 tablet  1 tablet Oral Q6H PRN Melrose Nakayama, MD   1  tablet at 05/19/13 0012  . ibuprofen (ADVIL,MOTRIN) tablet 400 mg  400 mg Oral QID PRN Melrose Nakayama, MD   400 mg at 05/17/13 1940  . levofloxacin (LEVAQUIN) IVPB 750 mg  750 mg Intravenous Q48H Benjamine Sprague [place], RPH   750 mg at 05/19/13 1422  . montelukast (SINGULAIR) tablet 10 mg  10 mg Oral Daily Melrose Nakayama, MD   10 mg at 05/19/13 1027  . oxyCODONE (Oxy IR/ROXICODONE) immediate release tablet 10 mg  10 mg Oral Q3H PRN Melrose Nakayama, MD   10  mg at 05/19/13 1153  . pantoprazole (PROTONIX) EC tablet 40 mg  40 mg Oral Q1200 Melrose Nakayama, MD   40 mg at 05/19/13 1152  . traMADol (ULTRAM) tablet 100 mg  100 mg Oral TID PRN Melrose Nakayama, MD   100 mg at 05/19/13 1153  . vitamin B-12 (CYANOCOBALAMIN) tablet 1,000 mcg  1,000 mcg Oral Daily Melrose Nakayama, MD   1,000 mcg at 05/19/13 1028  . vitamin C (ASCORBIC ACID) tablet 1,000 mg  1,000 mg Oral Daily Irene Limbo, MD      . vitamin E capsule 400 Units  400 Units Oral Daily Melrose Nakayama, MD   400 Units at 05/19/13 1028  . zolpidem (AMBIEN) tablet 5 mg  5 mg Oral QHS PRN Melrose Nakayama, MD       Facility-Administered Medications Ordered in Other Encounters  Medication Dose Route Frequency Provider Last Rate Last Dose  . chlorhexidine (HIBICLENS) 4 % liquid 2 application  30 mL Topical UD Melrose Nakayama, MD      . metoprolol tartrate (LOPRESSOR) tablet 12.5 mg  12.5 mg Oral Once Melrose Nakayama, MD        Allergies as of 05/14/2013 - Review Complete 05/14/2013  Allergen Reaction Noted  . Augmentin [amoxicillin-pot clavulanate] Other (See Comments) 02/24/2013  . Flagyl [metronidazole] Other (See Comments) 02/24/2013  . Tape Other (See Comments) 02/24/2013    Family History  Problem Relation Age of Onset  . Lung cancer Mother     worked at a [employer]  . Heart disease Mother   . Stomach cancer Maternal Grandmother   . Brain cancer Father     History   Social  History  . Marital Status: Widowed    Spouse Name: N/A    Number of Children: 0  . Years of Education: N/A   Occupational History  . Retired    Social History Main Topics  . Smoking status: Former Smoker -- 1.00 packs/day for 25 years    Types: Cigarettes    Quit date: 03/23/2002  . Smokeless tobacco: Never Used  . Alcohol Use: No  . Drug Use: No  . Sexual Activity: Not on file   Other Topics Concern  . Not on file   Social History Narrative  . No narrative on file    Review of Systems: Gen: Denies any fever, chills, sweats, anorexia, + fatigue, + weakness, +malaise,  HEENT: Diminished vision , CV: Admits to generalized chest pain,no  Angina, no  palpitations, no syncope, Resp: + dyspnea with exercise, cough, sputum, wheezing, coughing up blood, and pleurisy. GI: Denies vomiting blood, jaundice, and fecal incontinence.   Denies dysphagia or odynophagia. GU : Foley catheter with good urine output MS: Denies joint pain, limitation of movement, and swelling, stiffness, low back pain, extremity pain. Denies muscle weakness, cramps, atrophy.  No use of non steroidal antiinflammatory drugs. Derm: Denies rash, itching, dry skin, hives, moles, warts, or unhealing ulcers.  Psych: Denies depression, anxiety, memory loss, suicidal ideation, hallucinations, paranoia, and confusion. Heme: Denies bruising, bleeding, and enlarged lymph nodes. Neuro: No headache.  No diplopia. No dysarthria.  No dysphasia.  No history of CVA.  No Seizures. No paresthesias.  No weakness. Endocrine No DM.  No Thyroid disease.  No Adrenal disease.  Physical Exam: Vital signs in last 24 hours: Temp:  [97.4 F (36.3 C)-100.6 F (38.1 C)] 98.7 F (37.1 C) (02/27 1541) Pulse Rate:  [60-110] 92 (02/27 1500) Resp:  [11-21]  16 (02/27 1500) BP: (85-118)/(47-82) 109/63 mmHg (02/27 1500) SpO2:  [90 %-100 %] 99 % (02/27 1500) Weight:  [71.4 kg (157 lb 6.5 oz)] 71.4 kg (157 lb 6.5 oz) (02/27 0500) Last BM Date:  05/13/13 General:   Elderly appears unwell chronically Head:  Normocephalic and atraumatic. Eyes:  Sclera clear, no icterus.   Conjunctiva pale Ears:  Normal auditory acuity. Nose:  No deformity, discharge,  or lesions. Mouth:  No deformity or lesions, dentition normal. Neck:  Supple; no masses or thyromegaly. JVP not elevated Lungs:  Clear throughout to auscultation.   No wheezes, crackles, or rhonchi. No acute distress. Heart:  Regular rate and rhythm; no murmurs, clicks, rubs,  or gallops. Abdomen:  Soft, nontender and nondistended. No masses, hepatosplenomegaly or hernias noted. Normal bowel sounds, without guarding, and without rebound.   Msk:  Symmetrical without gross deformities. Normal posture. Pulses:  No carotid, renal, femoral bruits. DP and PT symmetrical and equal Extremities:  Without clubbing or edema. Neurologic:  Alert and  oriented x4;  grossly normal neurologically. Skin:  Intact without significant lesions or rashes.   Intake/Output from previous day: 02/26 0701 - 02/27 0700 In: 3595 [P.O.:860; I.V.:1900; Blood:335; IV Piggyback:500] Out: 3090 [Urine:2940; Drains:150] Intake/Output this shift: Total I/O In: 615 [I.V.:75; Blood:340; IV Piggyback:200] Out: 1425 [Urine:1425]  Lab Results:  Recent Labs  05/18/13 0500 05/19/13 0405  WBC 14.5* 12.4*  HGB 7.3* 7.5*  HCT 22.5* 23.3*  PLT 440* 430*   BMET  Recent Labs  05/18/13 0500 05/19/13 0405  NA 139 140  K 4.0 3.7  CL 103 104  CO2 25 26  GLUCOSE 115* 116*  BUN 8 10  CREATININE 1.68* 2.27*  CALCIUM 8.4 8.1*   LFT No results found for this basename: PROT, ALBUMIN, AST, ALT, ALKPHOS, BILITOT, BILIDIR, IBILI,  in the last 72 hours PT/INR No results found for this basename: LABPROT, INR,  in the last 72 hours Hepatitis Panel No results found for this basename: HEPBSAG, HCVAB, HEPAIGM, HEPBIGM,  in the last 72 hours  Studies/Results: Dg Knee 1-2 Views Right  05/18/2013   CLINICAL DATA:  Right  knee pain and swelling, no known injury  EXAM: RIGHT KNEE - 1-2 VIEW  COMPARISON:  12/16/2006  FINDINGS: Two views of right knee submitted. No acute fracture. Small joint effusion. There is progression of degenerative changes with significant narrowing of medial joint compartment. There is sclerosis of medial tibial plateau and medial femoral condyle. Mild medial subluxation about 5 mm of distal femur on tibial plateau. Mild spurring of medial tibial plateau. Mild chondrocalcinosis.  IMPRESSION: No acute fracture. Mild chondrocalcinosis. Small joint effusion. Progression of osteoarthritic changes as described above.   Electronically Signed   By: Lahoma Crocker M.D.   On: 05/18/2013 16:39   Dg Chest Port 1 View  05/18/2013   CLINICAL DATA:  Sternal wound infection.  EXAM: PORTABLE CHEST - 1 VIEW  COMPARISON:  DG CHEST 1V PORT dated 05/16/2013; DG CHEST 2 VIEW dated 04/18/2013; NM PET IMAGE INITIAL (PI) SKULL BASE TO THIGH dated 03/14/2013; CT ANGIO CHEST W/CM &/OR WO/CM dated 04/28/2013  FINDINGS: Right-sided subclavian line unchanged. Prior median sternotomy. Patient rotated to the right. Mild cardiomegaly. No pleural effusion or pneumothorax. Hyperinflation. Diffuse interstitial thickening. Left perihilar airspace disease is similar. Similar atelectasis at the left lung base.  IMPRESSION: No significant change since the prior exam.  Left perihilar atelectasis or infection, similar.  Cardiomegaly and COPD/ chronic bronchitis.   Electronically Signed  By: Abigail Miyamoto M.D.   On: 05/18/2013 08:10    Assessment/Plan: 1. Acute Kidney Injury appears to have been related to episodes of hypotension and I strongly suspect acute tubular necrosis. There appears little to suggest AGN or AIN although patient was taking antibiitics. The rise in creatinine coincides temporally with the surgery.   2. HTN  / volume  Better BP  No antihypertensives ACE or ARB    blood pressure is controlled   3. Anemia stable will defer  transfusion needs to Dr Roxan Hockey  4. Electrolytes stable  5. Vancomycin  Discontinued due to concerns of toxicity will check trough  Discussed with Dr Vernetta Honey and consider ID consult     LOS: 5 Iley Breeden W @TODAY @4 :09 PM

## 2013-05-19 NOTE — Progress Notes (Signed)
Patient ID: Troy [name], male   DOB: [birth year], 73 y.o.   MRN: [id number]  SICU Evening Rounds  Hemodynamically stable  Good urine output today.  Creat stable at 2.26.  No new problems tonight.

## 2013-05-19 NOTE — Progress Notes (Signed)
2 Days Post-Op Procedure(s) (LRB): STERNAL WOUND DEBRIDEMENT (N/A) WOUND VAC CHANGE (N/A) Subjective: C/o pain Still unable to bear weight on right knee- Dr. Percell Miller saw yesterday- NSAIDs  Objective: Vital signs in last 24 hours: Temp:  [97.5 F (36.4 C)-100.6 F (38.1 C)] 97.9 F (36.6 C) (02/27 0741) Pulse Rate:  [60-110] 60 (02/27 0700) Cardiac Rhythm:  [-] Normal sinus rhythm (02/27 0600) Resp:  [12-20] 12 (02/27 0700) BP: (85-131)/(47-68) 104/57 mmHg (02/27 0700) SpO2:  [95 %-100 %] 97 % (02/27 0700) Weight:  [157 lb 6.5 oz (71.4 kg)] 157 lb 6.5 oz (71.4 kg) (02/27 0500)  Hemodynamic parameters for last 24 hours:    Intake/Output from previous day: 02/26 0701 - 02/27 0700 In: 3595 [P.O.:860; I.V.:1900; Blood:335; IV Piggyback:500] Out: 3090 [Urine:2940; Drains:150] Intake/Output this shift:    General appearance: alert and cooperative Neurologic: intact Heart: regular rate and rhythm Lungs: diminished breath sounds bilaterally Wound: vac in place  Lab Results:  Recent Labs  05/18/13 0500 05/19/13 0405  WBC 14.5* 12.4*  HGB 7.3* 7.5*  HCT 22.5* 23.3*  PLT 440* 430*   BMET:  Recent Labs  05/18/13 0500 05/19/13 0405  NA 139 140  K 4.0 3.7  CL 103 104  CO2 25 26  GLUCOSE 115* 116*  BUN 8 10  CREATININE 1.68* 2.27*  CALCIUM 8.4 8.1*    PT/INR: No results found for this basename: LABPROT, INR,  in the last 72 hours ABG    Component Value Date/Time   PHART 7.384 04/20/2013 2014   HCO3 23.6 04/20/2013 2014   TCO2 27 04/21/2013 1700   ACIDBASEDEF 1.0 04/20/2013 2014   O2SAT 99.0 04/20/2013 2014   CBG (last 3)  No results found for this basename: GLUCAP,  in the last 72 hours  Assessment/Plan: S/P Procedure(s) (LRB): STERNAL WOUND DEBRIDEMENT (N/A) WOUND VAC CHANGE (N/A) - Sternal wound infection-   Will do VAC change at bedside today under conscious sedation  Dr. Migdalia Dk will check wound at that time  Gram stains showed GNR, cultures are no  growth- will ask ID to see  Continue vanco and levafloxacin for now- doses have been adjusted by pharmacy  Acute renal failure- lytes OK and good UO but creatinine rising- will ask Nephrology to see  Anemia- no significant change after 1 unit PRBC- will give another unit today  Severe protein calorie malnutrition- diet + supplements, PO intake only fair     LOS: 5 days    Troy [name] C 05/19/2013

## 2013-05-19 NOTE — Progress Notes (Signed)
CRITICAL VALUE ALERT  Critical value received:  Vancomycin Trough 33.1  Date of notification:  05/19/13  Time of notification:  2107  Critical value read back:yes  Nurse who received alert:  Vivia Ewing, RN  MD notified (1st page):  Dr. Justin Mend   Time of first page:  2108  Responding MD:  Dr. Justin Mend  Time MD responded:  2115

## 2013-05-19 NOTE — Progress Notes (Signed)
ANTIBIOTIC CONSULT NOTE - FOLLOW UP  Pharmacy Consult for Vancomycin Indication: Sternal wound infection  Allergies  Allergen Reactions  . Augmentin [Amoxicillin-Pot Clavulanate] Other (See Comments)    "passed out" when taking augmentin and flagyl together  . Flagyl [Metronidazole] Other (See Comments)    "passed out" when taking augmentin and flagyl together  . Tape Other (See Comments)    Blisters (need to use paper tape)    Patient Measurements: Height: 6' (182.9 cm) Weight: 157 lb 6.5 oz (71.4 kg) IBW/kg (Calculated) : 77.6  Vital Signs: Temp: 97.8 F (36.6 C) (02/27 0840) Temp src: Oral (02/27 0830) BP: 108/65 mmHg (02/27 0840) Pulse Rate: 82 (02/27 0840) Intake/Output from previous day: 02/26 0701 - 02/27 0700 In: 3595 [P.O.:860; I.V.:1900; Blood:335; IV Piggyback:500] Out: 3090 [Urine:2940; Drains:150] Intake/Output from this shift: Total I/O In: 90 [I.V.:75; Blood:15] Out: 175 [Urine:175]  Labs:  Recent Labs  05/18/13 0500 05/19/13 0405  WBC 14.5* 12.4*  HGB 7.3* 7.5*  PLT 440* 430*  CREATININE 1.68* 2.27*   Estimated Creatinine Clearance: 30.1 ml/min (by C-G formula based on Cr of 2.27).  Recent Labs  05/16/13 2011  [place] 12.5     Microbiology: Recent Results (from the past 720 hour(s))  CULTURE, BLOOD (ROUTINE X 2)     Status: None   Collection Time    04/26/13  4:20 PM      Result Value Ref Range Status   Specimen Description BLOOD LEFT ARM   Final   Special Requests BOTTLES DRAWN AEROBIC AND ANAEROBIC 10CC   Final   Culture  Setup Time     Final   Value: 04/26/2013 22:15     Performed at [facility]   Culture     Final   Value: NO GROWTH 5 DAYS     Performed at [facility]   Report Status 05/02/2013 FINAL   Final  CULTURE, BLOOD (ROUTINE X 2)     Status: None   Collection Time    04/26/13  4:32 PM      Result Value Ref Range Status   Specimen Description BLOOD RIGHT HAND   Final   Special Requests     Final    Value: BOTTLES DRAWN AEROBIC AND ANAEROBIC BLUE 10CC RED 5CC   Culture  Setup Time     Final   Value: 04/26/2013 22:15     Performed at [facility]   Culture     Final   Value: NO GROWTH 5 DAYS     Performed at [facility]   Report Status 05/02/2013 FINAL   Final  URINE CULTURE     Status: None   Collection Time    04/26/13  8:12 PM      Result Value Ref Range Status   Specimen Description URINE, CLEAN CATCH   Final   Special Requests ADDED [id number] [phone]   Final   Culture  Setup Time     Final   Value: 04/27/2013 13:06     Performed at [facility]     Final   Value: 30,000 COLONIES/ML     Performed at [facility]   Culture     Final   Value: Multiple bacterial morphotypes present, none predominant. Suggest appropriate recollection if clinically indicated.     Performed at [facility]   Report Status 04/28/2013 FINAL   Final  CULTURE, BLOOD (ROUTINE X 2)     Status: None  Collection Time    05/14/13  3:58 PM      Result Value Ref Range Status   Specimen Description BLOOD LEFT ARM   Final   Special Requests BOTTLES DRAWN AEROBIC AND ANAEROBIC 5CC   Final   Culture  Setup Time     Final   Value: 05/14/2013 19:47     Performed at [facility]   Culture     Final   Value:        BLOOD CULTURE RECEIVED NO GROWTH TO DATE CULTURE WILL BE HELD FOR 5 DAYS BEFORE ISSUING A FINAL NEGATIVE REPORT     Performed at [facility]   Report Status PENDING   Incomplete  WOUND CULTURE     Status: None   Collection Time    05/14/13  6:31 PM      Result Value Ref Range Status   Specimen Description WOUND CHEST   Final   Special Requests NONE   Final   Gram Stain     Final   Value: FEW WBC PRESENT, PREDOMINANTLY PMN     NO SQUAMOUS EPITHELIAL CELLS SEEN     FEW GRAM NEGATIVE RODS     Performed at [facility]   Culture     Final   Value: NO GROWTH 2 DAYS     Performed at [facility]   Report  Status 05/17/2013 FINAL   Final  CULTURE, BLOOD (ROUTINE X 2)     Status: None   Collection Time    05/14/13  8:40 PM      Result Value Ref Range Status   Specimen Description BLOOD RIGHT ARM   Final   Special Requests BOTTLES DRAWN AEROBIC ONLY 4CC   Final   Culture  Setup Time     Final   Value: 05/15/2013 00:45     Performed at [facility]   Culture     Final   Value:        BLOOD CULTURE RECEIVED NO GROWTH TO DATE CULTURE WILL BE HELD FOR 5 DAYS BEFORE ISSUING A FINAL NEGATIVE REPORT     Performed at [facility]   Report Status PENDING   Incomplete  WOUND CULTURE     Status: None   Collection Time    05/15/13  2:49 PM      Result Value Ref Range Status   Specimen Description WOUND STERNUM   Final   Special Requests NO 1 PT ON VANCO LAVEQUIN   Final   Gram Stain     Final   Value: MODERATE WBC PRESENT, PREDOMINANTLY PMN     NO SQUAMOUS EPITHELIAL CELLS SEEN     MODERATE GRAM NEGATIVE RODS     Performed at [facility]   Culture     Final   Value: NO GROWTH 2 DAYS     Performed at [facility]   Report Status 05/17/2013 FINAL   Final  ANAEROBIC CULTURE     Status: None   Collection Time    05/15/13  2:49 PM      Result Value Ref Range Status   Specimen Description WOUND STERNUM   Final   Special Requests NO 1 PT ON VANCO LAVEQUIN   Final   Gram Stain     Final   Value: MODERATE WBC PRESENT, PREDOMINANTLY PMN     NO SQUAMOUS EPITHELIAL CELLS SEEN     MODERATE GRAM NEGATIVE RODS     Performed  at [facility]     Final   Value: NO ANAEROBES ISOLATED; CULTURE IN PROGRESS FOR 5 DAYS     Performed at [facility]   Report Status PENDING   Incomplete  TISSUE CULTURE     Status: None   Collection Time    05/15/13  2:54 PM      Result Value Ref Range Status   Specimen Description TISSUE STERNUM   Final   Special Requests NO 2 PT ON VANCO LAVEQUIN   Final   Gram Stain     Final   Value: MODERATE WBC PRESENT,  PREDOMINANTLY PMN     NO ORGANISMS SEEN     Performed at [facility]   Culture     Final   Value: NO GROWTH 3 DAYS     Performed at [facility]   Report Status 05/19/2013 FINAL   Final  TISSUE CULTURE     Status: None   Collection Time    05/15/13  3:03 PM      Result Value Ref Range Status   Specimen Description TISSUE STERNUM   Final   Special Requests NO 3 STERNUM BONE PT ON VANCO LEVEQUIN   Final   Gram Stain     Final   Value: RARE WBC PRESENT, PREDOMINANTLY PMN     NO ORGANISMS SEEN     Performed at [facility]   Culture     Final   Value: NO GROWTH 3 DAYS     Performed at [facility]   Report Status 05/19/2013 FINAL   Final    Anti-infectives   Start     Dose/Rate Route Frequency Ordered Stop   05/19/13 1400  levofloxacin (LEVAQUIN) IVPB 750 mg     750 mg 100 mL/hr over 90 Minutes Intravenous Every 48 hours 05/18/13 0928     05/18/13 2100  vancomycin (VANCOCIN) 1,500 mg in sodium chloride 0.9 % 500 mL IVPB     1,500 mg 250 mL/hr over 120 Minutes Intravenous Every 24 hours 05/18/13 0922     05/16/13 2130  vancomycin (VANCOCIN) 1,500 mg in sodium chloride 0.9 % 500 mL IVPB  Status:  Discontinued     1,500 mg 250 mL/hr over 120 Minutes Intravenous Every 12 hours 05/16/13 2106 05/18/13 0922   05/16/13 1400  levofloxacin (LEVAQUIN) IVPB 750 mg  Status:  Discontinued     750 mg 100 mL/hr over 90 Minutes Intravenous Every 24 hours 05/15/13 1828 05/18/13 0928   05/15/13 1445  levofloxacin (LEVAQUIN) IVPB 500 mg  Status:  Discontinued     500 mg 100 mL/hr over 60 Minutes Intravenous To Surgery 05/15/13 1434 05/15/13 1828   05/14/13 2100  vancomycin (VANCOCIN) 1,250 mg in sodium chloride 0.9 % 250 mL IVPB  Status:  Discontinued     1,250 mg 166.7 mL/hr over 90 Minutes Intravenous Every 12 hours 05/14/13 2005 05/16/13 2106      Assessment: 36 yom on vancomycin day 5 for sternal wound infection s/p I&D and VAC placement. He was taken back  to the OR for further debridement and VAC exchange on 2/25. He is febrile with Tmax 100.6, WBC are elevated but trending down. Cultures are negative although gram stain in wound culture showed moderate GNR. Renal function continues to decline with increasing SCr. UOP is good. Vancomycin dose adjusted yesterday but has not had a level since renal function started changing. Although vancomycin is not at steady state  with dose change yesterday, I would like to evaluate a random vancomycin level prior to tonight's dose. ID consulted.  Goal of Therapy:  Vancomycin trough level 15-20 mcg/ml  Plan:  -Vancomycin level tonight at 20:30, pharmacy to adjust dose based on level -Consider changing Levaquin to 500 mg IV q48h if renal function worsens  [facility], Pharm.D., BCPS Clinical Pharmacist Pager: [phone] 05/19/2013 9:28 AM

## 2013-05-19 NOTE — Progress Notes (Signed)
VAC changed at bedside with 4 mg versed IV and 150 mcg of fentanyl IV  Patient did experience moderate discomfort  Wound is granulating- no further debridement needed

## 2013-05-19 NOTE — Progress Notes (Addendum)
NUTRITION FOLLOW UP  DOCUMENTATION CODES  Per approved criteria  -Severe malnutrition in the context of chronic illness   Pt continues to meet criteria for severe MALNUTRITION in the context of chronic illness as evidenced by 23% wt loss in 3 1/2 months and severe fat and muscle wasting.  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 Princess Perna over a month ago who presents to ED for postop complication.  Pt reports a weight loss of 46 lbs in 3 1/2 months. He was admitted to the hospital on 1/29 for CABG. Pt reports that he has been eating, but continues to loose weight.   Sternal wound debridement and VAC placement on 2/24. Sternal wound debridement and wound VAC change on 2/25.  Patient reports that he tries to eat well and drink [employer]. Per flow sheet records PO intake 75-100%. Dietetic intern encouraged patient to continue to pick foods that he thinks he will eat and drink Resource Breeze to help prevent further weight loss.   Height: Ht Readings from Last 1 Encounters:  05/14/13 6' (1.829 m)    Weight Status:   Wt Readings from Last 1 Encounters:  05/19/13 157 lb 6.5 oz (71.4 kg)    Re-estimated needs:  Kcal: 2000-2200  Protein: 120-130 g  Fluid: 2.0-2.2 L  Skin: incision on chest  Diet Order: General   Intake/Output Summary (Last 24 hours) at 05/19/13 0852 Last data filed at 05/19/13  0840  Gross per 24 hour  Intake   3465 ml  Output   3165 ml  Net    300 ml    Last BM: 2/21   Labs:   Recent Labs Lab 05/16/13 0354 05/18/13 0500 05/19/13 0405  NA 134* 139 140  K 4.2 4.0 3.7  CL 96 103 104  CO2 26 25 26   BUN 7 8 10   CREATININE 0.69 1.68* 2.27*  CALCIUM 8.4 8.4 8.1*  GLUCOSE 87 115* 116*    CBG (last 3)  No results found for this basename: GLUCAP,  in the last 72 hours  Scheduled Meds: . atorvastatin  20 mg Oral Daily  . enoxaparin (LOVENOX) injection  30 mg Subcutaneous Q24H  . feeding supplement (RESOURCE BREEZE)  1 Container Oral TID BM  . furosemide  40 mg Intravenous Once  . levofloxacin (LEVAQUIN) IV  750 mg Intravenous Q48H  . montelukast  10 mg Oral Daily  . pantoprazole  40 mg Oral Q1200  . vancomycin  1,500 mg Intravenous Q24H  . vitamin B-12  1,000 mcg Oral Daily  . vitamin E  400 Units Oral Daily    Continuous Infusions:   Claudell Kyle, Dietetic Intern Pager: [phone]  I agree with the Student-Dietitian note and made appropriate revisions.  Arthur Holms, RD, LDN Pager #: [phone] After-Hours Pager #: [phone]

## 2013-05-20 ENCOUNTER — Inpatient Hospital Stay (HOSPITAL_COMMUNITY): Payer: Medicare Other

## 2013-05-20 LAB — BASIC METABOLIC PANEL
BUN: 15 mg/dL (ref 6–23)
CALCIUM: 8.2 mg/dL — AB (ref 8.4–10.5)
CO2: 26 mEq/L (ref 19–32)
Chloride: 99 mEq/L (ref 96–112)
Creatinine, Ser: 2.5 mg/dL — ABNORMAL HIGH (ref 0.50–1.35)
GFR, EST AFRICAN AMERICAN: 28 mL/min — AB (ref >90)
GFR, EST NON AFRICAN AMERICAN: 24 mL/min — AB (ref >90)
Glucose, Bld: 98 mg/dL (ref 70–99)
POTASSIUM: 3.5 meq/L — AB (ref 3.7–5.3)
SODIUM: 137 meq/L (ref 137–147)

## 2013-05-20 LAB — TYPE AND SCREEN
ABO/RH(D): O POS
Antibody Screen: NEGATIVE
UNIT DIVISION: 0
Unit division: 0

## 2013-05-20 LAB — CBC
HCT: 26.5 % — ABNORMAL LOW (ref 39.0–52.0)
Hemoglobin: 8.8 g/dL — ABNORMAL LOW (ref 13.0–17.0)
MCH: 28.1 pg (ref 26.0–34.0)
MCHC: 33.2 g/dL (ref 30.0–36.0)
MCV: 84.7 fL (ref 78.0–100.0)
PLATELETS: 403 10*3/uL — AB (ref 150–400)
RBC: 3.13 MIL/uL — ABNORMAL LOW (ref 4.22–5.81)
RDW: 16.6 % — AB (ref 11.5–15.5)
WBC: 12 10*3/uL — ABNORMAL HIGH (ref 4.0–10.5)

## 2013-05-20 LAB — CULTURE, BLOOD (ROUTINE X 2): Culture: NO GROWTH

## 2013-05-20 LAB — VANCOMYCIN, RANDOM: VANCOMYCIN RM: 29.2 ug/mL

## 2013-05-20 LAB — PREALBUMIN: PREALBUMIN: 3.9 mg/dL — AB (ref 17.0–34.0)

## 2013-05-20 NOTE — Progress Notes (Signed)
Troy [name]   Subjective:   Interval History: sitting in chair today  Objective:  Vital signs in last 24 hours:  Temp:  [97.4 F (36.3 C)-98.7 F (37.1 C)] 98 F (36.7 C) (02/28 0822) Pulse Rate:  [66-92] 91 (02/28 0800) Resp:  [8-21] 21 (02/28 0800) BP: (85-118)/(50-69) 92/66 mmHg (02/28 0800) SpO2:  [92 %-100 %] 92 % (02/28 0800) Weight:  [71.6 kg (157 lb 13.6 oz)] 71.6 kg (157 lb 13.6 oz) (02/28 0700)  Weight change: 0.2 kg (7.1 oz) Filed Weights   05/18/13 0600 05/19/13 0500 05/20/13 0700  Weight: 71 kg (156 lb 8.4 oz) 71.4 kg (157 lb 6.5 oz) 71.6 kg (157 lb 13.6 oz)    Intake/Output: I/O last 3 completed shifts: In: 2425 [P.O.:360; I.V.:1025; Blood:340; IV Piggyback:700] Out: 5105 [Urine:5005; Drains:100]   Intake/Output this shift:  Total I/O In: 20 [I.V.:20] Out: 75 [Urine:50; Drains:25]  CVS- RRR RS- CTA ABD- BS present soft non-distended EXT- no edema   Basic Metabolic Panel:  Recent Labs Lab 05/16/13 0354 05/18/13 0500 05/19/13 0405 05/19/13 1600 05/20/13 0406  NA 134* 139 140 138 137  K 4.2 4.0 3.7 3.9 3.5*  CL 96 103 104 100 99  CO2 26 25 26 25 26   GLUCOSE 87 115* 116* 100* 98  BUN 7 8 10 12 15   CREATININE 0.69 1.68* 2.27* 2.26* 2.50*  CALCIUM 8.4 8.4 8.1* 8.4 8.2*    Liver Function Tests: No results found for this basename: AST, ALT, ALKPHOS, BILITOT, PROT, ALBUMIN,  in the last 168 hours No results found for this basename: LIPASE, AMYLASE,  in the last 168 hours No results found for this basename: AMMONIA,  in the last 168 hours  CBC:  Recent Labs Lab 05/14/13 1635 05/16/13 0354 05/18/13 0500 05/19/13 0405 05/20/13 0406  WBC 21.4* 18.9* 14.5* 12.4* 12.0*  NEUTROABS 16.8*  --   --   --   --   HGB 8.9* 8.3* 7.3* 7.5* 8.8*  HCT 27.8* 25.6* 22.5* 23.3* 26.5*  MCV 86.3 86.8 86.2 85.0 84.7  PLT 580* 476* 440* 430* 403*    Cardiac Enzymes: No results found for this basename: CKTOTAL, CKMB,  CKMBINDEX, TROPONINI,  in the last 168 hours  BNP: No components found with this basename: POCBNP,   CBG: No results found for this basename: GLUCAP,  in the last 168 hours  Microbiology: Results for orders placed during the hospital encounter of 05/14/13  CULTURE, BLOOD (ROUTINE X 2)     Status: None   Collection Time    05/14/13  3:58 PM      Result Value Ref Range Status   Specimen Description BLOOD LEFT ARM   Final   Special Requests BOTTLES DRAWN AEROBIC AND ANAEROBIC 5CC   Final   Culture  Setup Time     Final   Value: 05/14/2013 19:47     Performed at [facility]   Culture     Final   Value:        BLOOD CULTURE RECEIVED NO GROWTH TO DATE CULTURE WILL BE HELD FOR 5 DAYS BEFORE ISSUING A FINAL NEGATIVE REPORT     Performed at [facility]   Report Status PENDING   Incomplete  WOUND CULTURE     Status: None   Collection Time    05/14/13  6:31 PM      Result Value Ref Range Status   Specimen Description WOUND CHEST   Final   Special Requests  NONE   Final   Gram Stain     Final   Value: FEW WBC PRESENT, PREDOMINANTLY PMN     NO SQUAMOUS EPITHELIAL CELLS SEEN     FEW GRAM NEGATIVE RODS     Performed at [facility]   Culture     Final   Value: NO GROWTH 2 DAYS     Performed at [facility]   Report Status 05/17/2013 FINAL   Final  CULTURE, BLOOD (ROUTINE X 2)     Status: None   Collection Time    05/14/13  8:40 PM      Result Value Ref Range Status   Specimen Description BLOOD RIGHT ARM   Final   Special Requests BOTTLES DRAWN AEROBIC ONLY 4CC   Final   Culture  Setup Time     Final   Value: 05/15/2013 00:45     Performed at [facility]   Culture     Final   Value:        BLOOD CULTURE RECEIVED NO GROWTH TO DATE CULTURE WILL BE HELD FOR 5 DAYS BEFORE ISSUING A FINAL NEGATIVE REPORT     Performed at [facility]   Report Status PENDING   Incomplete  WOUND CULTURE     Status: None   Collection Time    05/15/13   2:49 PM      Result Value Ref Range Status   Specimen Description WOUND STERNUM   Final   Special Requests NO 1 PT ON VANCO LAVEQUIN   Final   Gram Stain     Final   Value: MODERATE WBC PRESENT, PREDOMINANTLY PMN     NO SQUAMOUS EPITHELIAL CELLS SEEN     MODERATE GRAM NEGATIVE RODS     Performed at [facility]   Culture     Final   Value: NO GROWTH 2 DAYS     Performed at [facility]   Report Status 05/17/2013 FINAL   Final  ANAEROBIC CULTURE     Status: None   Collection Time    05/15/13  2:49 PM      Result Value Ref Range Status   Specimen Description WOUND STERNUM   Final   Special Requests NO 1 PT ON VANCO LAVEQUIN   Final   Gram Stain     Final   Value: MODERATE WBC PRESENT, PREDOMINANTLY PMN     NO SQUAMOUS EPITHELIAL CELLS SEEN     MODERATE GRAM NEGATIVE RODS     Performed at [facility]   Culture     Final   Value: NO ANAEROBES ISOLATED; CULTURE IN PROGRESS FOR 5 DAYS     Performed at [facility]   Report Status PENDING   Incomplete  TISSUE CULTURE     Status: None   Collection Time    05/15/13  2:54 PM      Result Value Ref Range Status   Specimen Description TISSUE STERNUM   Final   Special Requests NO 2 PT ON VANCO LAVEQUIN   Final   Gram Stain     Final   Value: MODERATE WBC PRESENT, PREDOMINANTLY PMN     NO ORGANISMS SEEN     Performed at [facility]   Culture     Final   Value: NO GROWTH 3 DAYS     Performed at [facility]   Report Status 05/19/2013 FINAL   Final  TISSUE CULTURE  Status: None   Collection Time    05/15/13  3:03 PM      Result Value Ref Range Status   Specimen Description TISSUE STERNUM   Final   Special Requests NO 3 STERNUM BONE PT ON VANCO LEVEQUIN   Final   Gram Stain     Final   Value: RARE WBC PRESENT, PREDOMINANTLY PMN     NO ORGANISMS SEEN     Performed at [facility]   Culture     Final   Value: NO GROWTH 3 DAYS     Performed at [facility]   Report  Status 05/19/2013 FINAL   Final    Coagulation Studies: No results found for this basename: LABPROT, INR,  in the last 72 hours  Urinalysis: No results found for this basename: COLORURINE, APPERANCEUR, LABSPEC, PHURINE, GLUCOSEU, HGBUR, BILIRUBINUR, KETONESUR, PROTEINUR, UROBILINOGEN, NITRITE, LEUKOCYTESUR,  in the last 72 hours    Imaging: Dg Knee 1-2 Views Right  05/18/2013   CLINICAL DATA:  Right knee pain and swelling, no known injury  EXAM: RIGHT KNEE - 1-2 VIEW  COMPARISON:  12/16/2006  FINDINGS: Two views of right knee submitted. No acute fracture. Small joint effusion. There is progression of degenerative changes with significant narrowing of medial joint compartment. There is sclerosis of medial tibial plateau and medial femoral condyle. Mild medial subluxation about 5 mm of distal femur on tibial plateau. Mild spurring of medial tibial plateau. Mild chondrocalcinosis.  IMPRESSION: No acute fracture. Mild chondrocalcinosis. Small joint effusion. Progression of osteoarthritic changes as described above.   Electronically Signed   By: Lahoma Crocker M.D.   On: 05/18/2013 16:39     Medications:   . sodium chloride 20 mL/hr (05/19/13 2045)   . atorvastatin  20 mg Oral Daily  . enoxaparin (LOVENOX) injection  30 mg Subcutaneous Q24H  . feeding supplement (RESOURCE BREEZE)  1 Container Oral TID BM  . levofloxacin (LEVAQUIN) IV  750 mg Intravenous Q48H  . montelukast  10 mg Oral Daily  . pantoprazole  40 mg Oral Q1200  . vitamin B-12  1,000 mcg Oral Daily  . vitamin C  1,000 mg Oral Daily  . vitamin E  400 Units Oral Daily   acetaminophen, albuterol, alum & mag hydroxide-simeth, aspirin, diazepam, fentaNYL, guaiFENesin, HYDROcodone-acetaminophen, ibuprofen, oxyCODONE, traMADol, zolpidem  Assessment/ Plan:  post op from 1 V CABG to RCA using RIMA and right upper lobectomy for cancer, hx polycystic kidney disease with normal GFR on admission.Underwent operative debridement where superior  sternum noted to be hard in nature, healed and wires left in place. Caudal two wires removed and bone debrided to find fluid cavity substernal. Plastic surgery consulted for coverage. No organism seen so far on Gram stain. No NSAIDS or ACE or ARB. Developed some early post operative hypotension with blood pressure less than 90 mm Hg . Using Vancomycin coverage as well as levoquin. Urinalysis initially unremarkable has started developing proteinuria on dipstick testing.  1. Acute Kidney Injury appears to have been related to episodes of hypotension and I strongly suspect acute tubular necrosis. There appears little to suggest AGN or AIN although patient was taking antibiitics. The rise in creatinine coincides temporally with the surgery.   2. HTN / volume Better BP No antihypertensives ACE or ARB blood pressure is controlled   3. Anemia stable will defer transfusion needs to Dr Roxan Hockey   4. Electrolytes stable   5. Vancomycin Discontinued due to concerns of toxicity will check trough  Discussed with Dr Vernetta Honey and consider ID consult . Vancomycin levels are high range. Vancomycin stopped 2/27     LOS: 6 Gillie Fleites W @TODAY @10 :14 AM

## 2013-05-20 NOTE — Progress Notes (Signed)
3 Days Post-Op Procedure(s) (LRB): STERNAL WOUND DEBRIDEMENT (N/A) WOUND VAC CHANGE (N/A) Subjective:  No complaints  Not ambulating due to right knee pain  Not eating well  Objective: Vital signs in last 24 hours: Temp:  [97.1 F (36.2 C)-98.7 F (37.1 C)] 97.1 F (36.2 C) (02/28 1219) Pulse Rate:  [66-100] 100 (02/28 1200) Cardiac Rhythm:  [-] Sinus tachycardia (02/28 1200) Resp:  [8-21] 20 (02/28 1200) BP: (84-109)/(50-69) 103/68 mmHg (02/28 1200) SpO2:  [92 %-100 %] 97 % (02/28 1200) Weight:  [71.6 kg (157 lb 13.6 oz)] 71.6 kg (157 lb 13.6 oz) (02/28 0700)  Hemodynamic parameters for last 24 hours:    Intake/Output from previous day: 02/27 0701 - 02/28 0700 In: 815 [I.V.:275; Blood:340; IV Piggyback:200] Out: 3330 [Urine:3280; Drains:50] Intake/Output this shift: Total I/O In: 100 [I.V.:100] Out: 255 [Urine:230; Drains:25]  General appearance: alert and cooperative Heart: regular rate and rhythm, S1, S2 normal, no murmur, click, rub or gallop Lungs: clear to auscultation bilaterally Extremities: edema mild in legs Wound: vac in place. Remainder of incision looks good  Lab Results:  Recent Labs  05/19/13 0405 05/20/13 0406  WBC 12.4* 12.0*  HGB 7.5* 8.8*  HCT 23.3* 26.5*  PLT 430* 403*   BMET:  Recent Labs  05/19/13 1600 05/20/13 0406  NA 138 137  K 3.9 3.5*  CL 100 99  CO2 25 26  GLUCOSE 100* 98  BUN 12 15  CREATININE 2.26* 2.50*  CALCIUM 8.4 8.2*    PT/INR: No results found for this basename: LABPROT, INR,  in the last 72 hours ABG    Component Value Date/Time   PHART 7.384 04/20/2013 2014   HCO3 23.6 04/20/2013 2014   TCO2 27 04/21/2013 1700   ACIDBASEDEF 1.0 04/20/2013 2014   O2SAT 99.0 04/20/2013 2014   CBG (last 3)  No results found for this basename: GLUCAP,  in the last 72 hours  Assessment/Plan:  He is stable with VAC in place. Plan is for flap closure next week. Prealbumin is only 3.9 and he is not eating well or taking  supplements.  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  Acute renal failure: creatinine up slightly again today but making adequate urine. Vancomycin stopped due to high level. Nephrology following. His cultures have only grown some Gram negative rods so I don't think he needs a replacement for vancomycin. Levaquin should be adequate.   LOS: 6 days    Gilford Raid K 05/20/2013

## 2013-05-21 LAB — CBC
HCT: 25.7 % — ABNORMAL LOW (ref 39.0–52.0)
Hemoglobin: 8.5 g/dL — ABNORMAL LOW (ref 13.0–17.0)
MCH: 28 pg (ref 26.0–34.0)
MCHC: 33.1 g/dL (ref 30.0–36.0)
MCV: 84.5 fL (ref 78.0–100.0)
PLATELETS: 396 10*3/uL (ref 150–400)
RBC: 3.04 MIL/uL — AB (ref 4.22–5.81)
RDW: 16.5 % — AB (ref 11.5–15.5)
WBC: 11.6 10*3/uL — AB (ref 4.0–10.5)

## 2013-05-21 LAB — BASIC METABOLIC PANEL
BUN: 15 mg/dL (ref 6–23)
CALCIUM: 8.2 mg/dL — AB (ref 8.4–10.5)
CO2: 26 meq/L (ref 19–32)
Chloride: 105 mEq/L (ref 96–112)
Creatinine, Ser: 2.48 mg/dL — ABNORMAL HIGH (ref 0.50–1.35)
GFR calc non Af Amer: 25 mL/min — ABNORMAL LOW (ref >90)
GFR, EST AFRICAN AMERICAN: 28 mL/min — AB (ref >90)
Glucose, Bld: 97 mg/dL (ref 70–99)
Potassium: 3.8 mEq/L (ref 3.7–5.3)
SODIUM: 143 meq/L (ref 137–147)

## 2013-05-21 LAB — ANAEROBIC CULTURE

## 2013-05-21 LAB — CULTURE, BLOOD (ROUTINE X 2): CULTURE: NO GROWTH

## 2013-05-21 MED ORDER — LACTULOSE 10 GM/15ML PO SOLN
10.0000 g | Freq: Every day | ORAL | Status: DC | PRN
  Administered 2013-05-21: 10 g via ORAL
  Filled 2013-05-21 (×3): qty 15

## 2013-05-21 NOTE — Progress Notes (Signed)
4 Days Post-Op Procedure(s) (LRB): STERNAL WOUND DEBRIDEMENT (N/A) WOUND VAC CHANGE (N/A) Subjective: No complaints  Objective: Vital signs in last 24 hours: Temp:  [97.3 F (36.3 C)-99.4 F (37.4 C)] 97.4 F (36.3 C) (03/01 1149) Pulse Rate:  [63-96] 93 (03/01 1400) Cardiac Rhythm:  [-] Normal sinus rhythm (03/01 1400) Resp:  [8-23] 12 (03/01 1400) BP: (82-128)/(50-69) 102/65 mmHg (03/01 1400) SpO2:  [90 %-99 %] 93 % (03/01 1400) Weight:  [71.6 kg (157 lb 13.6 oz)] 71.6 kg (157 lb 13.6 oz) (03/01 0600)  Hemodynamic parameters for last 24 hours:    Intake/Output from previous day: 02/28 0701 - 03/01 0700 In: 740 [P.O.:260; I.V.:480] Out: 1460 [Urine:1410; Drains:50] Intake/Output this shift: Total I/O In: 140 [I.V.:140] Out: 400 [Urine:300; Drains:100]  General appearance: alert  Heart: regular rate and rhythm, S1, S2 normal, no murmur, click, rub or gallop Lungs: clear to auscultation bilaterally Extremities: edema mild.  Wound: vac in place  Lab Results:  Recent Labs  05/20/13 0406 05/21/13 0400  WBC 12.0* 11.6*  HGB 8.8* 8.5*  HCT 26.5* 25.7*  PLT 403* 396   BMET:  Recent Labs  05/20/13 0406 05/21/13 0400  NA 137 143  K 3.5* 3.8  CL 99 105  CO2 26 26  GLUCOSE 98 97  BUN 15 15  CREATININE 2.50* 2.48*  CALCIUM 8.2* 8.2*    PT/INR: No results found for this basename: LABPROT, INR,  in the last 72 hours ABG    Component Value Date/Time   PHART 7.384 04/20/2013 2014   HCO3 23.6 04/20/2013 2014   TCO2 27 04/21/2013 1700   ACIDBASEDEF 1.0 04/20/2013 2014   O2SAT 99.0 04/20/2013 2014   CBG (last 3)  No results found for this basename: GLUCAP,  in the last 72 hours  Assessment/Plan: S/P Procedure(s) (LRB): STERNAL WOUND DEBRIDEMENT (N/A) WOUND VAC CHANGE (N/A)  Stable with vac in sternal wound. Continue Levaquin Acute renal failure: creat stable at 2.48. Observe. Wound closure this week.   LOS: 7 days    BARTLE,BRYAN K 05/21/2013

## 2013-05-21 NOTE — Progress Notes (Signed)
[facility] ROUNDING NOTE   Subjective:   Interval History:no changes this AM  Objective:  Vital signs in last 24 hours:  Temp:  [97.1 F (36.2 C)-99.4 F (37.4 C)] 97.3 F (36.3 C) (03/01 0750) Pulse Rate:  [63-104] 95 (03/01 0900) Resp:  [8-20] 14 (03/01 0900) BP: (82-108)/(50-74) 108/69 mmHg (03/01 0900) SpO2:  [90 %-100 %] 95 % (03/01 0900) Weight:  [71.6 kg (157 lb 13.6 oz)] 71.6 kg (157 lb 13.6 oz) (03/01 0600)  Weight change: 0 kg (0 lb) Filed Weights   05/19/13 0500 05/20/13 0700 05/21/13 0600  Weight: 71.4 kg (157 lb 6.5 oz) 71.6 kg (157 lb 13.6 oz) 71.6 kg (157 lb 13.6 oz)    Intake/Output: I/O last 3 completed shifts: In: 940 [P.O.:260; I.V.:680] Out: 2760 [Urine:2660; Drains:100]   Intake/Output this shift:  Total I/O In: 40 [I.V.:40] Out: 280 [Urine:180; Drains:100]  CVS- RRR RS- CTA ABD- BS present soft non-distended EXT- no edema   Basic Metabolic Panel:  Recent Labs Lab 05/18/13 0500 05/19/13 0405 05/19/13 1600 05/20/13 0406 05/21/13 0400  NA 139 140 138 137 143  K 4.0 3.7 3.9 3.5* 3.8  CL 103 104 100 99 105  CO2 25 26 25 26 26   GLUCOSE 115* 116* 100* 98 97  BUN 8 10 12 15 15   CREATININE 1.68* 2.27* 2.26* 2.50* 2.48*  CALCIUM 8.4 8.1* 8.4 8.2* 8.2*    Liver Function Tests: No results found for this basename: AST, ALT, ALKPHOS, BILITOT, PROT, ALBUMIN,  in the last 168 hours No results found for this basename: LIPASE, AMYLASE,  in the last 168 hours No results found for this basename: AMMONIA,  in the last 168 hours  CBC:  Recent Labs Lab 05/14/13 1635 05/16/13 0354 05/18/13 0500 05/19/13 0405 05/20/13 0406 05/21/13 0400  WBC 21.4* 18.9* 14.5* 12.4* 12.0* 11.6*  NEUTROABS 16.8*  --   --   --   --   --   HGB 8.9* 8.3* 7.3* 7.5* 8.8* 8.5*  HCT 27.8* 25.6* 22.5* 23.3* 26.5* 25.7*  MCV 86.3 86.8 86.2 85.0 84.7 84.5  PLT 580* 476* 440* 430* 403* 396    Cardiac Enzymes: No results found for this basename:  CKTOTAL, CKMB, CKMBINDEX, TROPONINI,  in the last 168 hours  BNP: No components found with this basename: POCBNP,   CBG: No results found for this basename: GLUCAP,  in the last 168 hours  Microbiology: Results for orders placed during the hospital encounter of 05/14/13  CULTURE, BLOOD (ROUTINE X 2)     Status: None   Collection Time    05/14/13  3:58 PM      Result Value Ref Range Status   Specimen Description BLOOD LEFT ARM   Final   Special Requests BOTTLES DRAWN AEROBIC AND ANAEROBIC 5CC   Final   Culture  Setup Time     Final   Value: 05/14/2013 19:47     Performed at [place]     Final   Value: NO GROWTH 5 DAYS     Performed at [facility]   Report Status 05/20/2013 FINAL   Final  WOUND CULTURE     Status: None   Collection Time    05/14/13  6:31 PM      Result Value Ref Range Status   Specimen Description WOUND CHEST   Final   Special Requests NONE   Final   Gram Stain     Final   Value:  FEW WBC PRESENT, PREDOMINANTLY PMN     NO SQUAMOUS EPITHELIAL CELLS SEEN     FEW GRAM NEGATIVE RODS     Performed at [facility]   Culture     Final   Value: NO GROWTH 2 DAYS     Performed at [facility]   Report Status 05/17/2013 FINAL   Final  CULTURE, BLOOD (ROUTINE X 2)     Status: None   Collection Time    05/14/13  8:40 PM      Result Value Ref Range Status   Specimen Description BLOOD RIGHT ARM   Final   Special Requests BOTTLES DRAWN AEROBIC ONLY 4CC   Final   Culture  Setup Time     Final   Value: 05/15/2013 00:45     Performed at [facility]   Culture     Final   Value:        BLOOD CULTURE RECEIVED NO GROWTH TO DATE CULTURE WILL BE HELD FOR 5 DAYS BEFORE ISSUING A FINAL NEGATIVE REPORT     Performed at [facility]   Report Status PENDING   Incomplete  WOUND CULTURE     Status: None   Collection Time    05/15/13  2:49 PM      Result Value Ref Range Status   Specimen Description WOUND STERNUM    Final   Special Requests NO 1 PT ON VANCO LAVEQUIN   Final   Gram Stain     Final   Value: MODERATE WBC PRESENT, PREDOMINANTLY PMN     NO SQUAMOUS EPITHELIAL CELLS SEEN     MODERATE GRAM NEGATIVE RODS     Performed at [facility]   Culture     Final   Value: NO GROWTH 2 DAYS     Performed at [facility]   Report Status 05/17/2013 FINAL   Final  ANAEROBIC CULTURE     Status: None   Collection Time    05/15/13  2:49 PM      Result Value Ref Range Status   Specimen Description WOUND STERNUM   Final   Special Requests NO 1 PT ON VANCO LAVEQUIN   Final   Gram Stain     Final   Value: MODERATE WBC PRESENT, PREDOMINANTLY PMN     NO SQUAMOUS EPITHELIAL CELLS SEEN     MODERATE GRAM NEGATIVE RODS     Performed at [facility]   Culture     Final   Value: NO ANAEROBES ISOLATED; CULTURE IN PROGRESS FOR 5 DAYS     Performed at [facility]   Report Status PENDING   Incomplete  TISSUE CULTURE     Status: None   Collection Time    05/15/13  2:54 PM      Result Value Ref Range Status   Specimen Description TISSUE STERNUM   Final   Special Requests NO 2 PT ON VANCO LAVEQUIN   Final   Gram Stain     Final   Value: MODERATE WBC PRESENT, PREDOMINANTLY PMN     NO ORGANISMS SEEN     Performed at [facility]   Culture     Final   Value: NO GROWTH 3 DAYS     Performed at [facility]   Report Status 05/19/2013 FINAL   Final  TISSUE CULTURE     Status: None   Collection Time    05/15/13  3:03 PM  Result Value Ref Range Status   Specimen Description TISSUE STERNUM   Final   Special Requests NO 3 STERNUM BONE PT ON VANCO LEVEQUIN   Final   Gram Stain     Final   Value: RARE WBC PRESENT, PREDOMINANTLY PMN     NO ORGANISMS SEEN     Performed at [facility]   Culture     Final   Value: NO GROWTH 3 DAYS     Performed at [facility]   Report Status 05/19/2013 FINAL   Final    Coagulation Studies: No results found for  this basename: LABPROT, INR,  in the last 72 hours  Urinalysis: No results found for this basename: COLORURINE, APPERANCEUR, LABSPEC, PHURINE, GLUCOSEU, HGBUR, BILIRUBINUR, KETONESUR, PROTEINUR, UROBILINOGEN, NITRITE, LEUKOCYTESUR,  in the last 72 hours    Imaging: Dg Chest Port 1 View  05/20/2013   CLINICAL DATA:  Post lobectomy  EXAM: PORTABLE CHEST - 1 VIEW  COMPARISON:  DG CHEST 1V PORT dated 05/18/2013; DG CHEST 1V PORT dated 05/15/2013; DG CHEST 1V PORT dated 05/03/2013; DG CHEST 1V PORT dated 04/28/2013; CT ANGIO CHEST W/CM &/OR WO/CM dated 04/28/2013  FINDINGS: Grossly unchanged enlarged cardiac silhouette and mediastinal contours post median sternotomy and CABG. Stable position of support apparatus. Stable postsurgical change of the right on with associated mild right-sided volume loss with deviation of the cardiomediastinal structures to the right and right apical heterogeneous opacities. There is grossly unchanged mild diffuse slightly nodular thickening of the pulmonary interstitium. Unchanged left perihilar heterogeneous opacities, likely atelectasis. No new focal airspace opacities The lungs remain hyperexpanded. Unchanged bones.  IMPRESSION: Stable postsurgical appearance of the chest without interval change.   Electronically Signed   By: Sandi Mariscal M.D.   On: 05/20/2013 10:19     Medications:   . sodium chloride 20 mL/hr (05/19/13 2045)   . atorvastatin  20 mg Oral Daily  . enoxaparin (LOVENOX) injection  30 mg Subcutaneous Q24H  . feeding supplement (RESOURCE BREEZE)  1 Container Oral TID BM  . levofloxacin (LEVAQUIN) IV  750 mg Intravenous Q48H  . montelukast  10 mg Oral Daily  . pantoprazole  40 mg Oral Q1200  . vitamin B-12  1,000 mcg Oral Daily  . vitamin C  1,000 mg Oral Daily  . vitamin E  400 Units Oral Daily   acetaminophen, albuterol, alum & mag hydroxide-simeth, aspirin, diazepam, fentaNYL, guaiFENesin, HYDROcodone-acetaminophen, oxyCODONE, traMADol,  zolpidem  Assessment/ Plan:  Assessment/ Plan:   post op from 1 V CABG to RCA using RIMA and right upper lobectomy for cancer, hx polycystic kidney disease with normal GFR on admission.Underwent operative debridement where superior sternum noted to be hard in nature, healed and wires left in place. Caudal two wires removed and bone debrided to find fluid cavity substernal. Plastic surgery consulted for coverage. No organism seen so far on Gram stain. No NSAIDS or ACE or ARB. Developed some early post operative hypotension with blood pressure less than 90 mm Hg . Using Vancomycin coverage as well as levoquin. Urinalysis initially unremarkable has started developing proteinuria on dipstick testing.   1. Acute Kidney Injury appears to have been related to episodes of hypotension and I strongly suspect acute tubular necrosis. There appears little to suggest AGN or AIN although patient was taking antibiitics. The rise in creatinine coincides temporally with the surgery. Creatinine is improving 2. HTN / volume Better BP No antihypertensives ACE or ARB blood pressure is controlled  3. Anemia stable  will defer transfusion needs to Dr Roxan Hockey  4. Electrolytes stable  5. Vancomycin Discontinued due to concerns of toxicity will check trough Discussed with Dr Vernetta Honey and consider ID consult . Vancomycin levels are high range. Vancomycin stopped 2/27       LOS: 7 Domenique Quest W @TODAY @10 :22 AM    Seng Larch W 05/21/2013, 10:47 AM

## 2013-05-22 DIAGNOSIS — Y836 Removal of other organ (partial) (total) as the cause of abnormal reaction of the patient, or of later complication, without mention of misadventure at the time of the procedure: Secondary | ICD-10-CM

## 2013-05-22 DIAGNOSIS — B966 Bacteroides fragilis [B. fragilis] as the cause of diseases classified elsewhere: Secondary | ICD-10-CM

## 2013-05-22 DIAGNOSIS — T8140XA Infection following a procedure, unspecified, initial encounter: Principal | ICD-10-CM

## 2013-05-22 LAB — BASIC METABOLIC PANEL
BUN: 16 mg/dL (ref 6–23)
CHLORIDE: 99 meq/L (ref 96–112)
CO2: 25 mEq/L (ref 19–32)
CREATININE: 2.16 mg/dL — AB (ref 0.50–1.35)
Calcium: 8.6 mg/dL (ref 8.4–10.5)
GFR calc non Af Amer: 29 mL/min — ABNORMAL LOW (ref >90)
GFR, EST AFRICAN AMERICAN: 34 mL/min — AB (ref >90)
Glucose, Bld: 139 mg/dL — ABNORMAL HIGH (ref 70–99)
POTASSIUM: 3.4 meq/L — AB (ref 3.7–5.3)
SODIUM: 136 meq/L — AB (ref 137–147)

## 2013-05-22 MED ORDER — POLYETHYLENE GLYCOL 3350 17 G PO PACK
17.0000 g | PACK | Freq: Every day | ORAL | Status: DC | PRN
  Administered 2013-05-22: 17 g via ORAL
  Filled 2013-05-22: qty 1

## 2013-05-22 MED ORDER — FLEET ENEMA 7-19 GM/118ML RE ENEM
1.0000 | ENEMA | Freq: Every day | RECTAL | Status: DC | PRN
  Filled 2013-05-22: qty 1

## 2013-05-22 MED ORDER — ERTAPENEM SODIUM 1 G IJ SOLR
1.0000 g | INTRAMUSCULAR | Status: DC
  Filled 2013-05-22 (×2): qty 1

## 2013-05-22 NOTE — Progress Notes (Signed)
1. Acute Kidney Injury appears  Hemodynamically mediated, remains nonoliguric  Today's labs pending 2. HTN / volume Better BP No antihypertensives ACE or ARB blood pressure is a little soft  Subjective: Interval History:   Objective: Vital signs in last 24 hours: Temp:  [97.6 F (36.4 C)-98.3 F (36.8 C)] 97.8 F (36.6 C) (03/02 1115) Pulse Rate:  [60-96] 85 (03/02 1200) Resp:  [9-23] 12 (03/02 1200) BP: (83-128)/(52-77) 93/64 mmHg (03/02 1200) SpO2:  [91 %-100 %] 91 % (03/02 1200) Weight:  [72.2 kg (159 lb 2.8 oz)] 72.2 kg (159 lb 2.8 oz) (03/02 0600) Weight change: 0.6 kg (1 lb 5.2 oz)  Intake/Output from previous day: 03/01 0701 - 03/02 0700 In: 910 [P.O.:300; I.V.:460; IV Piggyback:150] Out: 1925 [Urine:1825; Drains:100] Intake/Output this shift: Total I/O In: 460 [P.O.:360; I.V.:100] Out: 300 [Urine:300]  General appearance: alert, cooperative and HOH Resp: diminished breath sounds bibasilar Chest wall: Dressed Cardio: RRR Extremities: 1+ edema pedal  Lab Results:  Recent Labs  05/20/13 0406 05/21/13 0400  WBC 12.0* 11.6*  HGB 8.8* 8.5*  HCT 26.5* 25.7*  PLT 403* 396   BMET:  Recent Labs  05/20/13 0406 05/21/13 0400  NA 137 143  K 3.5* 3.8  CL 99 105  CO2 26 26  GLUCOSE 98 97  BUN 15 15  CREATININE 2.50* 2.48*  CALCIUM 8.2* 8.2*   No results found for this basename: PTH,  in the last 72 hours Iron Studies: No results found for this basename: IRON, TIBC, TRANSFERRIN, FERRITIN,  in the last 72 hours Studies/Results: No results found.  Scheduled: . atorvastatin  20 mg Oral Daily  . enoxaparin (LOVENOX) injection  30 mg Subcutaneous Q24H  . feeding supplement (RESOURCE BREEZE)  1 Container Oral TID BM  . levofloxacin (LEVAQUIN) IV  750 mg Intravenous Q48H  . montelukast  10 mg Oral Daily  . pantoprazole  40 mg Oral Q1200  . vitamin B-12  1,000 mcg Oral Daily  . vitamin C  1,000 mg Oral Daily  . vitamin E  400 Units Oral Daily     LOS: 8  days   Kevin Space C 05/22/2013,12:40 PM

## 2013-05-22 NOTE — Progress Notes (Signed)
Patient ID: Troy [name], male   DOB: Aug 25, 1940, 73 y.o.   MRN: [id number] EVENING ROUNDS NOTE :     [address]       [place] [address]             [phone]                 5 Days Post-Op Procedure(s) (LRB): STERNAL WOUND DEBRIDEMENT (N/A) WOUND VAC CHANGE (N/A)  Total Length of Stay:  LOS: 8 days  BP 142/72  Pulse 125  Temp(Src) 97.8 F (36.6 C) (Oral)  Resp 17  Ht 6' (1.829 m)  Wt 159 lb 2.8 oz (72.2 kg)  BMI 21.58 kg/m2  SpO2 82%  .Intake/Output     03/02 0701 - 03/03 0700   P.O. 360   I.V. (mL/kg) 200 (2.8)   IV Piggyback    Total Intake(mL/kg) 560 (7.8)   Urine (mL/kg/hr) 650 (0.7)   Drains    Total Output 650   Net -90         . sodium chloride 20 mL/hr (05/22/13 1316)     Lab Results  Component Value Date   WBC 11.6* 05/21/2013   HGB 8.5* 05/21/2013   HCT 25.7* 05/21/2013   PLT 396 05/21/2013   GLUCOSE 139* 05/22/2013   ALT 12 04/18/2013   AST 17 04/18/2013   NA 136* 05/22/2013   K 3.4* 05/22/2013   CL 99 05/22/2013   CREATININE 2.16* 05/22/2013   BUN 16 05/22/2013   CO2 25 05/22/2013   INR 1.29 05/14/2013   HGBA1C 6.2* 04/18/2013   For flap tomorrow  Grace Isaac MD  Beeper [phone] Office [phone] 05/22/2013 7:31 PM

## 2013-05-22 NOTE — Progress Notes (Signed)
5 Days Post-Op Procedure(s) (LRB): STERNAL WOUND DEBRIDEMENT (N/A) WOUND VAC CHANGE (N/A) Subjective: Overall feels well, but c/o constipation  Objective: Vital signs in last 24 hours: Temp:  [97.4 F (36.3 C)-98.3 F (36.8 C)] 98.2 F (36.8 C) (03/02 0728) Pulse Rate:  [60-96] 83 (03/02 0800) Cardiac Rhythm:  [-] Normal sinus rhythm (03/02 0800) Resp:  [9-23] 23 (03/02 0800) BP: (82-128)/(52-77) 108/72 mmHg (03/02 0800) SpO2:  [93 %-100 %] 100 % (03/02 0800) Weight:  [159 lb 2.8 oz (72.2 kg)] 159 lb 2.8 oz (72.2 kg) (03/02 0600)  Hemodynamic parameters for last 24 hours:    Intake/Output from previous day: 03/01 0701 - 03/02 0700 In: 910 [P.O.:300; I.V.:460; IV Piggyback:150] Out: 1925 [Urine:1825; Drains:100] Intake/Output this shift: Total I/O In: 420 [P.O.:360; I.V.:60] Out: -   General appearance: alert and no distress Neurologic: intact Heart: regular rate and rhythm Lungs: clear to auscultation bilaterally Abdomen: mildly distended, nontender Wound: VAC in place  Lab Results:  Recent Labs  05/20/13 0406 05/21/13 0400  WBC 12.0* 11.6*  HGB 8.8* 8.5*  HCT 26.5* 25.7*  PLT 403* 396   BMET:  Recent Labs  05/20/13 0406 05/21/13 0400  NA 137 143  K 3.5* 3.8  CL 99 105  CO2 26 26  GLUCOSE 98 97  BUN 15 15  CREATININE 2.50* 2.48*  CALCIUM 8.2* 8.2*    PT/INR: No results found for this basename: LABPROT, INR,  in the last 72 hours ABG    Component Value Date/Time   PHART 7.384 04/20/2013 2014   HCO3 23.6 04/20/2013 2014   TCO2 27 04/21/2013 1700   ACIDBASEDEF 1.0 04/20/2013 2014   O2SAT 99.0 04/20/2013 2014   CBG (last 3)  No results found for this basename: GLUCAP,  in the last 72 hours  Assessment/Plan: S/P Procedure(s) (LRB): STERNAL WOUND DEBRIDEMENT (N/A) WOUND VAC CHANGE (N/A) - For rectus flap reconstruction tomorrow  On levaquin. Cultures grew out bacteroides- will ask ID to see  C/o constipation- lactulose  Malnutrition- PO  intake improving    LOS: 8 days    HENDRICKSON,STEVEN C 05/22/2013

## 2013-05-22 NOTE — Progress Notes (Signed)
Pt in chair Cr/BMP pending for this am; appears to have stabilized with Cr 2.48 on 3/1  Temp:  [97.4 F (36.3 C)-98.3 F (36.8 C)] 98.2 F (36.8 C) (03/02 0728) Pulse Rate:  [60-96] 77 (03/02 0700) Resp:  [9-23] 14 (03/02 0700) BP: (82-128)/(52-77) 110/61 mmHg (03/02 0700) SpO2:  [93 %-100 %] 100 % (03/02 0700) Weight:  [72.2 kg (159 lb 2.8 oz)] 72.2 kg (159 lb 2.8 oz) (03/02 0600)  Regular clear VAC in place no cellulitis serosanguinous drainage  A/P OR tomorrow for debridement and rectus abdominus flap to chest Pt with sever protein calorie malnutrition- reviewed risks including wound healing problems, donor site pain, hernia, seroma, hematoma, need for additional surgery, failure flap, recurrent infection.  Irene Limbo, MD [facility] [phone]

## 2013-05-22 NOTE — Consult Note (Signed)
[facility] [place] [facility] for Infectious Disease     Reason for Consult:sternal wound infection    Referring Physician: Dr. Roxan Hockey  Active Problems:   Wound infection after surgery   . atorvastatin  20 mg Oral Daily  . enoxaparin (LOVENOX) injection  30 mg Subcutaneous Q24H  . feeding supplement (RESOURCE BREEZE)  1 Container Oral TID BM  . levofloxacin (LEVAQUIN) IV  750 mg Intravenous Q48H  . montelukast  10 mg Oral Daily  . pantoprazole  40 mg Oral Q1200  . vitamin B-12  1,000 mcg Oral Daily  . vitamin C  1,000 mg Oral Daily  . vitamin E  400 Units Oral Daily    Recommendations: I will change to Invanz  Treatment is typically 2 weeks after flap placement (tomorrow) through March 16th.  PICC line   Assessment: He has a sternal wound infection which is likely monomicrobial and has grown Bacteroides on operative culture.  I would consider this the culprit.  Levaquin may have activity, though is variable and since we can't use flagyl, a second option is a carbapenem and Invanz he can do once a day at home.  He also was on levaquin before admission.    Antibiotics: levaquin day 7  HPI: Troy [name] is a 73 y.o. male with CABG x 1 and right upper lobe lobectomy done 04/20/2013 via median sternotomy who presented 2/22 with fever for about 5 days, increased sternal pain, and abscess.  He underwent debridement 2/23 and found purulence on the lower portion and had a VAC placed.  One anaerobic culture grew Bacteroides fragilis, beta lactamase positive.  He did go back to the OR 2/26 and found most areas with good granulation tissue and required little debridement. Last fever noted was on 2/26.  No complaints now, no chills.      Review of Systems: A comprehensive review of systems was negative.  Past Medical History  Diagnosis Date  . CAD (coronary artery disease)   . HTN (hypertension)   . COPD (chronic obstructive pulmonary disease)   . Hyperlipidemia   . Depression   . Vitamin D  deficiency   . Sleep apnea   . Anginal pain   . Shortness of breath   . Arthritis   . Polycystic kidney disease     History  Substance Use Topics  . Smoking status: Former Smoker -- 1.00 packs/day for 25 years    Types: Cigarettes    Quit date: 03/23/2002  . Smokeless tobacco: Never Used  . Alcohol Use: No    Family History  Problem Relation Age of Onset  . Lung cancer Mother     worked at a [employer]  . Heart disease Mother   . Stomach cancer Maternal Grandmother   . Brain cancer Father    Allergies  Allergen Reactions  . Augmentin [Amoxicillin-Pot Clavulanate] Other (See Comments)    "passed out" when taking augmentin and flagyl together  . Flagyl [Metronidazole] Other (See Comments)    "passed out" when taking augmentin and flagyl together  . Tape Other (See Comments)    Blisters (need to use paper tape)    OBJECTIVE: Blood pressure 93/64, pulse 85, temperature 97.8 F (36.6 C), temperature source Oral, resp. rate 12, height 6' (1.829 m), weight 159 lb 2.8 oz (72.2 kg), SpO2 91.00%. General: Awake, in chair Skin: no rashes, multiple bruises Lungs: CTA B Cor: RRR without m Abdomen: soft, nt, nd Ext: no edema Chest: +right central line, c/d/i  Microbiology:  Recent Results (from the past 240 hour(s))  CULTURE, BLOOD (ROUTINE X 2)     Status: None   Collection Time    05/14/13  3:58 PM      Result Value Ref Range Status   Specimen Description BLOOD LEFT ARM   Final   Special Requests BOTTLES DRAWN AEROBIC AND ANAEROBIC 5CC   Final   Culture  Setup Time     Final   Value: 05/14/2013 19:47     Performed at [facility]   Culture     Final   Value: NO GROWTH 5 DAYS     Performed at [facility]   Report Status 05/20/2013 FINAL   Final  WOUND CULTURE     Status: None   Collection Time    05/14/13  6:31 PM      Result Value Ref Range Status   Specimen Description WOUND CHEST   Final   Special Requests NONE   Final   Gram Stain     Final     Value: FEW WBC PRESENT, PREDOMINANTLY PMN     NO SQUAMOUS EPITHELIAL CELLS SEEN     FEW GRAM NEGATIVE RODS     Performed at [facility]   Culture     Final   Value: NO GROWTH 2 DAYS     Performed at [facility]   Report Status 05/17/2013 FINAL   Final  CULTURE, BLOOD (ROUTINE X 2)     Status: None   Collection Time    05/14/13  8:40 PM      Result Value Ref Range Status   Specimen Description BLOOD RIGHT ARM   Final   Special Requests BOTTLES DRAWN AEROBIC ONLY 4CC   Final   Culture  Setup Time     Final   Value: 05/15/2013 00:45     Performed at [facility]   Culture     Final   Value: NO GROWTH 5 DAYS     Performed at [facility]   Report Status 05/21/2013 FINAL   Final  WOUND CULTURE     Status: None   Collection Time    05/15/13  2:49 PM      Result Value Ref Range Status   Specimen Description WOUND STERNUM   Final   Special Requests NO 1 PT ON VANCO LAVEQUIN   Final   Gram Stain     Final   Value: MODERATE WBC PRESENT, PREDOMINANTLY PMN     NO SQUAMOUS EPITHELIAL CELLS SEEN     MODERATE GRAM NEGATIVE RODS     Performed at [facility]   Culture     Final   Value: NO GROWTH 2 DAYS     Performed at [facility]   Report Status 05/17/2013 FINAL   Final  ANAEROBIC CULTURE     Status: None   Collection Time    05/15/13  2:49 PM      Result Value Ref Range Status   Specimen Description WOUND STERNUM   Final   Special Requests NO 1 PT ON VANCO LAVEQUIN   Final   Gram Stain     Final   Value: MODERATE WBC PRESENT, PREDOMINANTLY PMN     NO SQUAMOUS EPITHELIAL CELLS SEEN     MODERATE GRAM NEGATIVE RODS     Performed at [facility]   Culture     Final   Value: FEW BACTEROIDES FRAGILIS  Note: BETA LACTAMASE POSITIVE     Performed at [facility]   Report Status 05/21/2013 FINAL   Final  TISSUE CULTURE     Status: None   Collection Time    05/15/13  2:54 PM      Result Value Ref Range Status    Specimen Description TISSUE STERNUM   Final   Special Requests NO 2 PT ON VANCO LAVEQUIN   Final   Gram Stain     Final   Value: MODERATE WBC PRESENT, PREDOMINANTLY PMN     NO ORGANISMS SEEN     Performed at [facility]   Culture     Final   Value: NO GROWTH 3 DAYS     Performed at [facility]   Report Status 05/19/2013 FINAL   Final  TISSUE CULTURE     Status: None   Collection Time    05/15/13  3:03 PM      Result Value Ref Range Status   Specimen Description TISSUE STERNUM   Final   Special Requests NO 3 STERNUM BONE PT ON VANCO LEVEQUIN   Final   Gram Stain     Final   Value: RARE WBC PRESENT, PREDOMINANTLY PMN     NO ORGANISMS SEEN     Performed at [facility]   Culture     Final   Value: NO GROWTH 3 DAYS     Performed at [facility]   Report Status 05/19/2013 FINAL   Final    Scharlene Gloss, [email or web address] [phone] pager  [phone] cell 05/22/2013, 2:09 PM

## 2013-05-23 ENCOUNTER — Ambulatory Visit: Payer: Self-pay | Admitting: Thoracic Surgery (Cardiothoracic Vascular Surgery)

## 2013-05-23 ENCOUNTER — Encounter (HOSPITAL_COMMUNITY)
Admission: EM | Disposition: A | Discharge status: Home Health | Payer: Self-pay | Source: Home / Self Care | Attending: Thoracic Surgery (Cardiothoracic Vascular Surgery)

## 2013-05-23 ENCOUNTER — Inpatient Hospital Stay (HOSPITAL_COMMUNITY): Payer: Medicare Other | Admitting: Certified Registered Nurse Anesthetist

## 2013-05-23 ENCOUNTER — Encounter (HOSPITAL_COMMUNITY): Payer: Medicare Other | Admitting: Certified Registered Nurse Anesthetist

## 2013-05-23 HISTORY — PX: TRAM: SHX5363

## 2013-05-23 LAB — BASIC METABOLIC PANEL
BUN: 16 mg/dL (ref 6–23)
CHLORIDE: 106 meq/L (ref 96–112)
CO2: 24 mEq/L (ref 19–32)
Calcium: 7.9 mg/dL — ABNORMAL LOW (ref 8.4–10.5)
Creatinine, Ser: 2.15 mg/dL — ABNORMAL HIGH (ref 0.50–1.35)
GFR calc Af Amer: 34 mL/min — ABNORMAL LOW (ref >90)
GFR calc non Af Amer: 29 mL/min — ABNORMAL LOW (ref >90)
Glucose, Bld: 96 mg/dL (ref 70–99)
POTASSIUM: 3.7 meq/L (ref 3.7–5.3)
SODIUM: 141 meq/L (ref 137–147)

## 2013-05-23 LAB — CBC
HCT: 25.3 % — ABNORMAL LOW (ref 39.0–52.0)
Hemoglobin: 8.3 g/dL — ABNORMAL LOW (ref 13.0–17.0)
MCH: 27.6 pg (ref 26.0–34.0)
MCHC: 32.8 g/dL (ref 30.0–36.0)
MCV: 84.1 fL (ref 78.0–100.0)
Platelets: 393 10*3/uL (ref 150–400)
RBC: 3.01 MIL/uL — ABNORMAL LOW (ref 4.22–5.81)
RDW: 16.3 % — ABNORMAL HIGH (ref 11.5–15.5)
WBC: 12.5 10*3/uL — ABNORMAL HIGH (ref 4.0–10.5)

## 2013-05-23 LAB — PREPARE RBC (CROSSMATCH)

## 2013-05-23 LAB — BLOOD PRODUCT ORDER (VERBAL) VERIFICATION

## 2013-05-23 SURGERY — TRANSVERSE RECTUS ABDOMINIS MYOCUTANEOUS (TRAM)
Anesthesia: General | Laterality: Left

## 2013-05-23 MED ORDER — PHENYLEPHRINE HCL 10 MG/ML IJ SOLN
INTRAMUSCULAR | Status: DC | PRN
  Administered 2013-05-23: 40 ug via INTRAVENOUS
  Administered 2013-05-23: 80 ug via INTRAVENOUS
  Administered 2013-05-23: 40 ug via INTRAVENOUS

## 2013-05-23 MED ORDER — SODIUM CHLORIDE 0.9 % IV SOLN
INTRAVENOUS | Status: DC
Start: 2013-05-23 — End: 2013-05-31
  Administered 2013-05-23 – 2013-05-24 (×2): via INTRAVENOUS

## 2013-05-23 MED ORDER — MIDAZOLAM HCL 2 MG/2ML IJ SOLN
INTRAMUSCULAR | Status: AC
  Filled 2013-05-23: qty 2

## 2013-05-23 MED ORDER — STERILE WATER FOR INJECTION IJ SOLN
INTRAMUSCULAR | Status: AC
  Filled 2013-05-23: qty 10

## 2013-05-23 MED ORDER — ONDANSETRON HCL 4 MG/2ML IJ SOLN
INTRAMUSCULAR | Status: DC | PRN
  Administered 2013-05-23: 4 mg via INTRAVENOUS

## 2013-05-23 MED ORDER — SODIUM CHLORIDE 0.9 % IV SOLN
1.0000 g | INTRAVENOUS | Status: DC
  Administered 2013-05-23 – 2013-05-31 (×9): 1 g via INTRAVENOUS
  Filled 2013-05-23 (×9): qty 1

## 2013-05-23 MED ORDER — FENTANYL CITRATE 0.05 MG/ML IJ SOLN
INTRAMUSCULAR | Status: AC
  Filled 2013-05-23: qty 5

## 2013-05-23 MED ORDER — VECURONIUM BROMIDE 10 MG IV SOLR
INTRAVENOUS | Status: DC | PRN
  Administered 2013-05-23: 2 mg via INTRAVENOUS
  Administered 2013-05-23: 1 mg via INTRAVENOUS

## 2013-05-23 MED ORDER — 0.9 % SODIUM CHLORIDE (POUR BTL) OPTIME
TOPICAL | Status: DC | PRN
  Administered 2013-05-23: 1000 mL

## 2013-05-23 MED ORDER — ONDANSETRON HCL 4 MG/2ML IJ SOLN
4.0000 mg | Freq: Four times a day (QID) | INTRAMUSCULAR | Status: DC | PRN
  Filled 2013-05-23: qty 2

## 2013-05-23 MED ORDER — EPHEDRINE SULFATE 50 MG/ML IJ SOLN
INTRAMUSCULAR | Status: AC
  Filled 2013-05-23: qty 1

## 2013-05-23 MED ORDER — GLYCOPYRROLATE 0.2 MG/ML IJ SOLN
INTRAMUSCULAR | Status: AC
  Filled 2013-05-23: qty 4

## 2013-05-23 MED ORDER — ARTIFICIAL TEARS OP OINT
TOPICAL_OINTMENT | OPHTHALMIC | Status: DC | PRN
  Administered 2013-05-23: 1 via OPHTHALMIC

## 2013-05-23 MED ORDER — ROCURONIUM BROMIDE 50 MG/5ML IV SOLN
INTRAVENOUS | Status: AC
  Filled 2013-05-23: qty 1

## 2013-05-23 MED ORDER — VECURONIUM BROMIDE 10 MG IV SOLR
INTRAVENOUS | Status: AC
  Filled 2013-05-23: qty 10

## 2013-05-23 MED ORDER — FENTANYL CITRATE 0.05 MG/ML IJ SOLN
INTRAMUSCULAR | Status: DC | PRN
  Administered 2013-05-23 (×4): 50 ug via INTRAVENOUS
  Administered 2013-05-23: 100 ug via INTRAVENOUS
  Administered 2013-05-23: 50 ug via INTRAVENOUS

## 2013-05-23 MED ORDER — HYDROMORPHONE HCL PF 1 MG/ML IJ SOLN
0.2500 mg | INTRAMUSCULAR | Status: DC | PRN
  Administered 2013-05-23 (×2): 0.25 mg via INTRAVENOUS
  Administered 2013-05-23 (×2): 0.5 mg via INTRAVENOUS

## 2013-05-23 MED ORDER — ALBUMIN HUMAN 5 % IV SOLN
INTRAVENOUS | Status: DC | PRN
  Administered 2013-05-23: 15:00:00 via INTRAVENOUS

## 2013-05-23 MED ORDER — ROCURONIUM BROMIDE 100 MG/10ML IV SOLN
INTRAVENOUS | Status: DC | PRN
  Administered 2013-05-23: 10 mg via INTRAVENOUS
  Administered 2013-05-23: 40 mg via INTRAVENOUS

## 2013-05-23 MED ORDER — HYDROMORPHONE HCL PF 1 MG/ML IJ SOLN
INTRAMUSCULAR | Status: AC
  Filled 2013-05-23: qty 1

## 2013-05-23 MED ORDER — ARTIFICIAL TEARS OP OINT
TOPICAL_OINTMENT | OPHTHALMIC | Status: AC
  Filled 2013-05-23: qty 3.5

## 2013-05-23 MED ORDER — PHENYLEPHRINE 40 MCG/ML (10ML) SYRINGE FOR IV PUSH (FOR BLOOD PRESSURE SUPPORT)
PREFILLED_SYRINGE | INTRAVENOUS | Status: AC
  Filled 2013-05-23: qty 10

## 2013-05-23 MED ORDER — PROPOFOL 10 MG/ML IV BOLUS
INTRAVENOUS | Status: AC
  Filled 2013-05-23: qty 20

## 2013-05-23 MED ORDER — LIDOCAINE HCL (CARDIAC) 20 MG/ML IV SOLN
INTRAVENOUS | Status: AC
  Filled 2013-05-23: qty 5

## 2013-05-23 MED ORDER — NEOSTIGMINE METHYLSULFATE 1 MG/ML IJ SOLN
INTRAMUSCULAR | Status: DC | PRN
  Administered 2013-05-23: 5 mg via INTRAVENOUS

## 2013-05-23 MED ORDER — SODIUM CHLORIDE 0.9 % IR SOLN
Status: DC | PRN
  Administered 2013-05-23: 14:00:00

## 2013-05-23 MED ORDER — NEOSTIGMINE METHYLSULFATE 1 MG/ML IJ SOLN
INTRAMUSCULAR | Status: AC
  Filled 2013-05-23: qty 10

## 2013-05-23 MED ORDER — LIDOCAINE HCL (CARDIAC) 20 MG/ML IV SOLN
INTRAVENOUS | Status: DC | PRN
  Administered 2013-05-23: 60 mg via INTRAVENOUS

## 2013-05-23 MED ORDER — PHENYLEPHRINE HCL 10 MG/ML IJ SOLN
INTRAMUSCULAR | Status: AC
  Filled 2013-05-23: qty 1

## 2013-05-23 MED ORDER — HYDROMORPHONE HCL PF 1 MG/ML IJ SOLN: 0.2500 mg | INTRAMUSCULAR | Status: DC | PRN

## 2013-05-23 MED ORDER — PROPOFOL 10 MG/ML IV BOLUS
INTRAVENOUS | Status: DC | PRN
  Administered 2013-05-23: 100 mg via INTRAVENOUS

## 2013-05-23 MED ORDER — GLYCOPYRROLATE 0.2 MG/ML IJ SOLN
INTRAMUSCULAR | Status: DC | PRN
  Administered 2013-05-23: .8 mg via INTRAVENOUS

## 2013-05-23 MED ORDER — MIDAZOLAM HCL 5 MG/5ML IJ SOLN
INTRAMUSCULAR | Status: DC | PRN
  Administered 2013-05-23 (×2): 1 mg via INTRAVENOUS

## 2013-05-23 MED ORDER — HYDROMORPHONE HCL PF 1 MG/ML IJ SOLN
INTRAMUSCULAR | Status: AC
Start: 2013-05-23 — End: 2013-05-24
  Filled 2013-05-23: qty 1

## 2013-05-23 SURGICAL SUPPLY — 86 items
APPLIER CLIP 9.375 MED OPEN (MISCELLANEOUS) ×3
BAG DECANTER FOR FLEXI CONT (MISCELLANEOUS) ×3 IMPLANT
BINDER ABD UNIV 12 45-62 (WOUND CARE) IMPLANT
BINDER ABDOMINAL 46IN 62IN (WOUND CARE)
BINDER BREAST LRG (GAUZE/BANDAGES/DRESSINGS) IMPLANT
BINDER BREAST XLRG (GAUZE/BANDAGES/DRESSINGS) IMPLANT
BLADE SURG 10 STRL SS (BLADE) ×3 IMPLANT
BLADE SURG 15 STRL LF DISP TIS (BLADE) ×1 IMPLANT
BLADE SURG 15 STRL SS (BLADE) ×2
BLADE SURG ROTATE 9660 (MISCELLANEOUS) IMPLANT
CANISTER SUCTION 2500CC (MISCELLANEOUS) ×3 IMPLANT
CHLORAPREP W/TINT 26ML (MISCELLANEOUS) ×3 IMPLANT
CLIP APPLIE 9.375 MED OPEN (MISCELLANEOUS) ×1 IMPLANT
CLOSURE WOUND 1/2 X4 (GAUZE/BANDAGES/DRESSINGS)
CORDS BIPOLAR (ELECTRODE) ×3 IMPLANT
COVER MAYO STAND STRL (DRAPES) IMPLANT
COVER SURGICAL LIGHT HANDLE (MISCELLANEOUS) ×3 IMPLANT
DERMABOND ADVANCED (GAUZE/BANDAGES/DRESSINGS) ×2
DERMABOND ADVANCED .7 DNX12 (GAUZE/BANDAGES/DRESSINGS) ×1 IMPLANT
DRAIN CHANNEL 19F RND (DRAIN) IMPLANT
DRAPE ORTHO SPLIT 77X108 STRL (DRAPES) ×4
DRAPE PROXIMA HALF (DRAPES) ×3 IMPLANT
DRAPE SURG 17X11 SM STRL (DRAPES) IMPLANT
DRAPE SURG ORHT 6 SPLT 77X108 (DRAPES) ×2 IMPLANT
DRAPE WARM FLUID 44X44 (DRAPE) ×3 IMPLANT
DRESSING TELFA 8X3 (GAUZE/BANDAGES/DRESSINGS) ×6 IMPLANT
DRSG PAD ABDOMINAL 8X10 ST (GAUZE/BANDAGES/DRESSINGS) IMPLANT
ELECT BLADE 6.5 EXT (BLADE) ×3 IMPLANT
ELECT CAUTERY BLADE 6.4 (BLADE) ×3 IMPLANT
ELECT COATED BLADE 2.86 ST (ELECTRODE) ×3 IMPLANT
ELECT REM PT RETURN 9FT ADLT (ELECTROSURGICAL) ×3
ELECTRODE REM PT RTRN 9FT ADLT (ELECTROSURGICAL) ×1 IMPLANT
EVACUATOR SILICONE 100CC (DRAIN) ×6 IMPLANT
GAUZE XEROFORM 1X8 LF (GAUZE/BANDAGES/DRESSINGS) IMPLANT
GAUZE XEROFORM 5X9 LF (GAUZE/BANDAGES/DRESSINGS) IMPLANT
GLOVE BIO SURGEON STRL SZ 6 (GLOVE) ×3 IMPLANT
GLOVE BIO SURGEON STRL SZ 6.5 (GLOVE) ×2 IMPLANT
GLOVE BIO SURGEONS STRL SZ 6.5 (GLOVE) ×1
GLOVE BIOGEL PI IND STRL 6.5 (GLOVE) ×3 IMPLANT
GLOVE BIOGEL PI IND STRL 7.5 (GLOVE) ×1 IMPLANT
GLOVE BIOGEL PI INDICATOR 6.5 (GLOVE) ×6
GLOVE BIOGEL PI INDICATOR 7.5 (GLOVE) ×2
GLOVE ECLIPSE 6.5 STRL STRAW (GLOVE) ×6 IMPLANT
GOWN STRL NON-REIN LRG LVL3 (GOWN DISPOSABLE) ×9 IMPLANT
GOWN STRL REUS W/ TWL XL LVL3 (GOWN DISPOSABLE) ×1 IMPLANT
GOWN STRL REUS W/TWL XL LVL3 (GOWN DISPOSABLE) ×2
KIT BASIN OR (CUSTOM PROCEDURE TRAY) ×3 IMPLANT
LOOP VESSEL MAXI BLUE (MISCELLANEOUS) IMPLANT
MARKER SKIN DUAL TIP RULER LAB (MISCELLANEOUS) ×3 IMPLANT
NS IRRIG 1000ML POUR BTL (IV SOLUTION) ×3 IMPLANT
PACK GENERAL/GYN (CUSTOM PROCEDURE TRAY) ×3 IMPLANT
PAD ARMBOARD 7.5X6 YLW CONV (MISCELLANEOUS) ×6 IMPLANT
PAD SHARPS MAGNETIC DISPOSAL (MISCELLANEOUS) IMPLANT
PIN SAFETY STERILE (MISCELLANEOUS) IMPLANT
RELOAD PROXIMATE 75MM BLUE (ENDOMECHANICALS) IMPLANT
SPONGE GAUZE 4X4 12PLY (GAUZE/BANDAGES/DRESSINGS) IMPLANT
SPONGE GAUZE 4X4 12PLY STER LF (GAUZE/BANDAGES/DRESSINGS) ×6 IMPLANT
SPONGE LAP 18X18 X RAY DECT (DISPOSABLE) IMPLANT
STAPLER PROXIMATE 75MM BLUE (STAPLE) ×3 IMPLANT
STAPLER VISISTAT 35W (STAPLE) ×3 IMPLANT
STRIP CLOSURE SKIN 1/2X4 (GAUZE/BANDAGES/DRESSINGS) IMPLANT
SUT ETHIBOND NAB CTX #1 30IN (SUTURE) IMPLANT
SUT ETHILON 2 0 FS 18 (SUTURE) ×6 IMPLANT
SUT MNCRL AB 3-0 PS2 18 (SUTURE) IMPLANT
SUT MNCRL AB 4-0 PS2 18 (SUTURE) IMPLANT
SUT MON AB 5-0 PS2 18 (SUTURE) IMPLANT
SUT PDS AB 0 CT 36 (SUTURE) IMPLANT
SUT PDS AB 2-0 CT1 27 (SUTURE) ×9 IMPLANT
SUT PDS AB 3-0 SH 27 (SUTURE) ×6 IMPLANT
SUT PLAIN 5 0 P 3 18 (SUTURE) IMPLANT
SUT VIC AB 3-0 PS2 18 (SUTURE)
SUT VIC AB 3-0 PS2 18XBRD (SUTURE) IMPLANT
SUT VIC AB 3-0 SH 18 (SUTURE) IMPLANT
SUT VIC AB 3-0 SH 27 (SUTURE)
SUT VIC AB 3-0 SH 27X BRD (SUTURE) IMPLANT
SUT VIC AB 4-0 PS2 27 (SUTURE) ×9 IMPLANT
SUT VICRYL 4-0 PS2 18IN ABS (SUTURE) IMPLANT
SUT VLOC 180 0 24IN GS25 (SUTURE) IMPLANT
SWAB COLLECTION DEVICE MRSA (MISCELLANEOUS) ×3 IMPLANT
SYR BULB IRRIGATION 50ML (SYRINGE) ×3 IMPLANT
TAPE PAPER 3X10 WHT MICROPORE (GAUZE/BANDAGES/DRESSINGS) ×6 IMPLANT
TOWEL OR 17X24 6PK STRL BLUE (TOWEL DISPOSABLE) ×3 IMPLANT
TOWEL OR 17X26 10 PK STRL BLUE (TOWEL DISPOSABLE) ×3 IMPLANT
TRAY FOLEY CATH 14FRSI W/METER (CATHETERS) IMPLANT
TUBE ANAEROBIC SPECIMEN COL (MISCELLANEOUS) ×3 IMPLANT
WATER STERILE IRR 1000ML POUR (IV SOLUTION) ×3 IMPLANT

## 2013-05-23 NOTE — Transfer of Care (Signed)
Immediate Anesthesia Transfer of Care Note  Patient: Troy [name]  Procedure(s) Performed: Procedure(s): LEFT RECTUS ABDOMINIS MUSCLE FLAP TO CHEST (Left)  Patient Location: PACU  Anesthesia Type:General  Level of Consciousness: awake and alert   Airway & Oxygen Therapy: Patient Spontanous Breathing and Patient connected to face mask oxygen  Post-op Assessment: Report given to PACU RN and Post -op Vital signs reviewed and stable  Post vital signs: Reviewed and stable  Complications: No apparent anesthesia complications

## 2013-05-23 NOTE — Progress Notes (Addendum)
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 Princess Perna over a month ago who presents to ED for postop complication.  Sternal wound debridement and VAC placement on 2/24. Sternal wound debridement and wound VAC change on 2/25.  Plastic Surgery following.  Patient for procedure today -- left rectus abdominis muscle flap to chest.  RD spoke with patient prior to trip to OR.  Reports he's eating "as best as I can".  + constipation.  PO intake variable at 30-80% per flowsheet records.  Drinking his Resource Breeze TID.  RD encouraged him to continue with his oral nutrition supplements.  Height: Ht Readings from Last 1 Encounters:  05/14/13 6' (1.829 m)    Weight Status:   Wt Readings from Last 1 Encounters:  05/23/13 158 lb 4.6 oz (71.8 kg)    Re-estimated needs:  Kcal: 2000-2200  Protein: 120-130 g  Fluid: 2.0-2.2 L  Skin: sternal wound  Diet Order: NPO   Intake/Output Summary (Last 24 hours) at 05/23/13 1154 Last data filed at 05/23/13 1100  Gross per 24 hour  Intake   1080 ml  Output   2560 ml  Net  -1480 ml    Labs:   Recent Labs Lab 05/21/13 0400 05/22/13 1300 05/23/13 0405  NA 143 136* 141  K 3.8 3.4* 3.7  CL 105 99 106  CO2 26 25 24   BUN 15 16 16   CREATININE 2.48* 2.16* 2.15*   CALCIUM 8.2* 8.6 7.9*  GLUCOSE 97 139* 96    Scheduled Meds: . atorvastatin  20 mg Oral Daily  . enoxaparin (LOVENOX) injection  30 mg Subcutaneous Q24H  . ertapenem  1 g Intravenous Q24H  . feeding supplement (RESOURCE BREEZE)  1 Container Oral TID BM  . montelukast  10 mg Oral Daily  . pantoprazole  40 mg Oral Q1200  . vitamin B-12  1,000 mcg Oral Daily  . vitamin C  1,000 mg Oral Daily  . vitamin E  400 Units Oral Daily    Continuous Infusions: . sodium chloride 20 mL/hr (05/22/13 1316)    Arthur Holms, RD, LDN Pager #: [phone] After-Hours Pager #: [phone]

## 2013-05-23 NOTE — Progress Notes (Signed)
6 Days Post-Op Procedure(s) (LRB): STERNAL WOUND DEBRIDEMENT (N/A) WOUND VAC CHANGE (N/A) Subjective: C/o a "crick in my neck" Otherwise feels well  Objective: Vital signs in last 24 hours: Temp:  [97.4 F (36.3 C)-99.9 F (37.7 C)] 97.4 F (36.3 C) (03/03 0700) Pulse Rate:  [72-125] 80 (03/03 0700) Cardiac Rhythm:  [-] Normal sinus rhythm (03/03 0600) Resp:  [10-23] 13 (03/03 0700) BP: (83-142)/(48-72) 102/59 mmHg (03/03 0700) SpO2:  [82 %-100 %] 99 % (03/03 0700) Weight:  [158 lb 4.6 oz (71.8 kg)] 158 lb 4.6 oz (71.8 kg) (03/03 0600)  Hemodynamic parameters for last 24 hours:    Intake/Output from previous day: 03/02 0701 - 03/03 0700 In: 1440 [P.O.:960; I.V.:480] Out: 2300 [Urine:2250; Drains:50] Intake/Output this shift:    General appearance: alert and no distress Neurologic: intact Heart: regular rate and rhythm Lungs: diminished breath sounds bilaterally  Lab Results:  Recent Labs  05/21/13 0400 05/23/13 0405  WBC 11.6* 12.5*  HGB 8.5* 8.3*  HCT 25.7* 25.3*  PLT 396 393   BMET:  Recent Labs  05/22/13 1300 05/23/13 0405  NA 136* 141  K 3.4* 3.7  CL 99 106  CO2 25 24  GLUCOSE 139* 96  BUN 16 16  CREATININE 2.16* 2.15*  CALCIUM 8.6 7.9*    PT/INR: No results found for this basename: LABPROT, INR,  in the last 72 hours ABG    Component Value Date/Time   PHART 7.384 04/20/2013 2014   HCO3 23.6 04/20/2013 2014   TCO2 27 04/21/2013 1700   ACIDBASEDEF 1.0 04/20/2013 2014   O2SAT 99.0 04/20/2013 2014   CBG (last 3)  No results found for this basename: GLUCAP,  in the last 72 hours  Assessment/Plan: S/P Procedure(s) (LRB): STERNAL WOUND DEBRIDEMENT (N/A) WOUND VAC CHANGE (N/A) - 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

## 2013-05-23 NOTE — Anesthesia Postprocedure Evaluation (Signed)
  Anesthesia Post-op Note  Patient: Troy [name]  Procedure(s) Performed: Procedure(s): LEFT RECTUS ABDOMINIS MUSCLE FLAP TO CHEST (Left)  Patient Location: PACU  Anesthesia Type:General  Level of Consciousness: awake  Airway and Oxygen Therapy: Patient Spontanous Breathing  Post-op Pain: mild  Post-op Assessment: Post-op Vital signs reviewed  Post-op Vital Signs: Reviewed  Complications: No apparent anesthesia complications

## 2013-05-23 NOTE — Progress Notes (Signed)
Invanz had not arrived to floor prior to pt leaving for OR. Pharmacy called to make aware to send medication to OR/Short stay for the med to be given as the patient was gone to OR. Short stay called and spoke with Sharyn Lull to make them aware that invanz was coming to them and that the med needed to be given.

## 2013-05-23 NOTE — Progress Notes (Signed)
CT surgery p.m. Rounds  Patient resting comfortably after rectus muscle flap to lower sternal incision Complaining of mild nausea Hemodynamic stable JP drains with minimal output Doing well

## 2013-05-23 NOTE — Anesthesia Procedure Notes (Signed)
Procedure Name: Intubation Date/Time: 05/23/2013 1:39 PM Performed by: [place] Pink Pre-anesthesia Checklist: Patient identified, Timeout performed, Emergency Drugs available, Suction available and Patient being monitored Patient Re-evaluated:Patient Re-evaluated prior to inductionOxygen Delivery Method: Circle system utilized Preoxygenation: Pre-oxygenation with 100% oxygen Intubation Type: IV induction Ventilation: Mask ventilation without difficulty Laryngoscope Size: Mac and 3 Grade View: Grade I Tube type: Oral Tube size: 8.0 mm Number of attempts: 1 Airway Equipment and Method: Stylet Placement Confirmation: ETT inserted through vocal cords under direct vision,  positive ETCO2 and breath sounds checked- equal and bilateral Secured at: 22 cm Tube secured with: Tape Dental Injury: Teeth and Oropharynx as per pre-operative assessment

## 2013-05-23 NOTE — Anesthesia Preprocedure Evaluation (Signed)
Anesthesia Evaluation  Patient identified by MRN, date of birth, ID band Patient awake    Reviewed: Allergy & Precautions, H&P , NPO status , Patient's Chart, lab work & pertinent test results  Airway Mallampati: I      Dental   Pulmonary shortness of breath, sleep apnea , COPDformer smoker,  breath sounds clear to auscultation        Cardiovascular hypertension, + angina + CAD Rhythm:Regular Rate:Normal     Neuro/Psych    GI/Hepatic negative GI ROS, Neg liver ROS,   Endo/Other    Renal/GU Renal disease     Musculoskeletal   Abdominal   Peds  Hematology   Anesthesia Other Findings   Reproductive/Obstetrics                           Anesthesia Physical Anesthesia Plan  ASA: III  Anesthesia Plan: General   Post-op Pain Management:    Induction: Intravenous  Airway Management Planned: Oral ETT  Additional Equipment:   Intra-op Plan:   Post-operative Plan: Possible Post-op intubation/ventilation  Informed Consent: I have reviewed the patients History and Physical, chart, labs and discussed the procedure including the risks, benefits and alternatives for the proposed anesthesia with the patient or authorized representative who has indicated his/her understanding and acceptance.   Dental advisory given  Plan Discussed with: CRNA and Anesthesiologist  Anesthesia Plan Comments:         Anesthesia Quick Evaluation

## 2013-05-23 NOTE — H&P (View-Only) (Signed)
Pt in chair Cr/BMP pending for this am; appears to have stabilized with Cr 2.48 on 3/1  Temp:  [97.4 F (36.3 C)-98.3 F (36.8 C)] 98.2 F (36.8 C) (03/02 0728) Pulse Rate:  [60-96] 77 (03/02 0700) Resp:  [9-23] 14 (03/02 0700) BP: (82-128)/(52-77) 110/61 mmHg (03/02 0700) SpO2:  [93 %-100 %] 100 % (03/02 0700) Weight:  [72.2 kg (159 lb 2.8 oz)] 72.2 kg (159 lb 2.8 oz) (03/02 0600)  Regular clear VAC in place no cellulitis serosanguinous drainage  A/P OR tomorrow for debridement and rectus abdominus flap to chest Pt with sever protein calorie malnutrition- reviewed risks including wound healing problems, donor site pain, hernia, seroma, hematoma, need for additional surgery, failure flap, recurrent infection.  Irene Limbo, MD [facility] Plastic & Reconstructive Surgery [phone]

## 2013-05-23 NOTE — Interval H&P Note (Signed)
History and Physical Interval Note:  05/23/2013 7:00 AM  Troy [name]  has presented today for surgery, with the diagnosis of sternal wound infection  The various methods of treatment have been discussed with the patient and family. After consideration of risks, benefits and other options for treatment, the patient has consented to  Procedure(s): LEFT RECTUS ABDOMINIS MUSCLE FLAP TO CHEST (Left) as a surgical intervention .  The patient's history has been reviewed, patient examined, no change in status, stable for surgery.  I have reviewed the patient's chart and labs.  Questions were answered to the patient's satisfaction.     Ervine Witucki

## 2013-05-23 NOTE — Op Note (Signed)
Operative Note   DATE OF OPERATION: 05/23/13  LOCATION: [facility] Main OR Inpatient  SURGICAL DIVISION: Plastic Surgery  PREOPERATIVE DIAGNOSES:  1. Hx aortocoronary bypass 2. Lung cancer 3. Sternal wound infection  POSTOPERATIVE DIAGNOSES:  same  PROCEDURE:  Vertical rectus abdominus muscle flap to chest  SURGEON: Irene Limbo MD MBA  ASSISTANT: none  ANESTHESIA:  General.   EBL: 174 ml  COMPLICATIONS: None.   INDICATIONS FOR PROCEDURE:  The patient, Troy [name], is a 73 y.o. male born on 03-15-1941, is here for treatment of open wound caudal chest following sternal debridement that has grown bacteroides species.   FINDING: Viable sternal bone. No hernias encountered in left hemi abdomen.   DESCRIPTION OF PROCEDURE:  The patient's operative site was marked with the patient in the preoperative area. The patient was taken to the operating room. SCDs were placed and IV antibiotics were given. The patient's operative site was prepped and draped in a sterile fashion. A time out was performed and all information was confirmed to be correct. I began with sharp excision of wound margins in chest. This included skin, subcutaneous tissue, bone and cartilage. The wound was then irrigated with bacitrain-polymyxin solution. New instruments were then used to obtain sternal bone for culture. I then directed my attention to abdomen where patient's prior midline scar was incised. Incision was carried to anterior rectus fascia, and anterior sheath was incised. The rectus abdominus was freed from both anterior and posterior sheath at level of pubis and muscle divided with GIA 75 stapler. Dissection then proceeded cephalically; segmental nerves and blood vessels controlled with clips. Subcutaneous tunnel was developed to chest wound and the muscle flap was then rotated into sternal wound without tension. The rectus abdominus muscle was then sewn to chest wall and defect with 2-0 PDS. A 15 Fr JP was placed in chest  in submuscular position in chest. The abdominal wound was irrigated and inspected for hemostasis. The anterior rectus fascia was closed with interrupted 2-0 PDS. A 15 Fr JP was placed in subcutaneous position. 4-0 vicryl was placed in dermis. The chest wound was closed with interrupted 2-0 PDS to approximate superficial fascia. Additional 4-0 vicryl was placed in dermis. Skin closure was completed throughout with staples. The patient's previous mediastinal dressing skin exit was excised an 4-0 vicryl was placed in dermis.  Dry dressing was applied.  Drains secured with 2-0 nylon.   The patient was allowed to wake from anesthesia, extubated and taken to the recovery room in satisfactory condition.   SPECIMENS: sternal bone for culture  DRAINS: 15 Fr JP in chest, 15 Fr JP in subcutaneous abdomen  Irene Limbo, MD [facility] Plastic & Reconstructive Surgery [phone]

## 2013-05-24 LAB — BASIC METABOLIC PANEL
BUN: 15 mg/dL (ref 6–23)
CALCIUM: 8.4 mg/dL (ref 8.4–10.5)
CHLORIDE: 106 meq/L (ref 96–112)
CO2: 23 meq/L (ref 19–32)
CREATININE: 1.97 mg/dL — AB (ref 0.50–1.35)
GFR calc Af Amer: 38 mL/min — ABNORMAL LOW (ref >90)
GFR calc non Af Amer: 32 mL/min — ABNORMAL LOW (ref >90)
Glucose, Bld: 120 mg/dL — ABNORMAL HIGH (ref 70–99)
Potassium: 4.2 mEq/L (ref 3.7–5.3)
Sodium: 143 mEq/L (ref 137–147)

## 2013-05-24 LAB — CBC
HEMATOCRIT: 30 % — AB (ref 39.0–52.0)
Hemoglobin: 9.7 g/dL — ABNORMAL LOW (ref 13.0–17.0)
MCH: 27.6 pg (ref 26.0–34.0)
MCHC: 32.3 g/dL (ref 30.0–36.0)
MCV: 85.2 fL (ref 78.0–100.0)
Platelets: 382 10*3/uL (ref 150–400)
RBC: 3.52 MIL/uL — AB (ref 4.22–5.81)
RDW: 16.2 % — ABNORMAL HIGH (ref 11.5–15.5)
WBC: 17.9 10*3/uL — ABNORMAL HIGH (ref 4.0–10.5)

## 2013-05-24 MED ORDER — SODIUM CHLORIDE 0.9 % IJ SOLN
10.0000 mL | INTRAMUSCULAR | Status: DC | PRN
  Administered 2013-05-31 (×3): 10 mL

## 2013-05-24 MED ORDER — SODIUM CHLORIDE 0.9 % IJ SOLN
10.0000 mL | Freq: Two times a day (BID) | INTRAMUSCULAR | Status: DC
  Administered 2013-05-24 – 2013-05-30 (×11): 10 mL

## 2013-05-24 NOTE — Progress Notes (Signed)
T[facility] BRIEF SICU PROGRESS NOTE  1 Day Post-Op  S/P Procedure(s) (LRB): LEFT RECTUS ABDOMINIS MUSCLE FLAP TO CHEST (Left)   Stable day NSR w/ stable BP O2 sats 90-95% on RA UOP adequate  Plan: Continue current plan  [name],Troy H 05/24/2013 5:49 PM

## 2013-05-24 NOTE — Progress Notes (Signed)
[place] for Infectious Disease  Date of Admission:  05/14/2013  Antibiotics:  Subjective: No complaints, flap yesterday  Objective: Temp:  [97.2 F (36.2 C)-98.2 F (36.8 C)] 98.2 F (36.8 C) (03/04 0800) Pulse Rate:  [80-100] 89 (03/04 0800) Resp:  [13-34] 16 (03/04 0800) BP: (93-170)/(43-72) 108/60 mmHg (03/04 0800) SpO2:  [87 %-100 %] 100 % (03/04 0800)  General: awake, alert, nad Skin: no rashes Lungs: CTA B Cor: RRR without m Abdomen: soft, mild tenderness, nd Ext: no edema  Lab Results Lab Results  Component Value Date   WBC 17.9* 05/24/2013   HGB 9.7* 05/24/2013   HCT 30.0* 05/24/2013   MCV 85.2 05/24/2013   PLT 382 05/24/2013    Lab Results  Component Value Date   CREATININE 2.15* 05/23/2013   BUN 16 05/23/2013   NA 141 05/23/2013   K 3.7 05/23/2013   CL 106 05/23/2013   CO2 24 05/23/2013    Lab Results  Component Value Date   ALT 12 04/18/2013   AST 17 04/18/2013   ALKPHOS 90 04/18/2013   BILITOT 0.3 04/18/2013      Microbiology: Recent Results (from the past 240 hour(s))  CULTURE, BLOOD (ROUTINE X 2)     Status: None   Collection Time    05/14/13  3:58 PM      Result Value Ref Range Status   Specimen Description BLOOD LEFT ARM   Final   Special Requests BOTTLES DRAWN AEROBIC AND ANAEROBIC 5CC   Final   Culture  Setup Time     Final   Value: 05/14/2013 19:47     Performed at [facility]   Culture     Final   Value: NO GROWTH 5 DAYS     Performed at [facility]   Report Status 05/20/2013 FINAL   Final  WOUND CULTURE     Status: None   Collection Time    05/14/13  6:31 PM      Result Value Ref Range Status   Specimen Description WOUND CHEST   Final   Special Requests NONE   Final   Gram Stain     Final   Value: FEW WBC PRESENT, PREDOMINANTLY PMN     NO SQUAMOUS EPITHELIAL CELLS SEEN     FEW GRAM NEGATIVE RODS     Performed at [facility]   Culture     Final   Value: NO GROWTH 2 DAYS     Performed at [facility]   Report Status 05/17/2013 FINAL   Final  CULTURE, BLOOD (ROUTINE X 2)     Status: None   Collection Time    05/14/13  8:40 PM      Result Value Ref Range Status   Specimen Description BLOOD RIGHT ARM   Final   Special Requests BOTTLES DRAWN AEROBIC ONLY 4CC   Final   Culture  Setup Time     Final   Value: 05/15/2013 00:45     Performed at [facility]   Culture     Final   Value: NO GROWTH 5 DAYS     Performed at [facility]   Report Status 05/21/2013 FINAL   Final  WOUND CULTURE     Status: None   Collection Time    05/15/13  2:49 PM      Result Value Ref Range Status   Specimen Description WOUND STERNUM   Final   Special Requests NO 1  PT ON VANCO LAVEQUIN   Final   Gram Stain     Final   Value: MODERATE WBC PRESENT, PREDOMINANTLY PMN     NO SQUAMOUS EPITHELIAL CELLS SEEN     MODERATE GRAM NEGATIVE RODS     Performed at [facility]   Culture     Final   Value: NO GROWTH 2 DAYS     Performed at [facility]   Report Status 05/17/2013 FINAL   Final  ANAEROBIC CULTURE     Status: None   Collection Time    05/15/13  2:49 PM      Result Value Ref Range Status   Specimen Description WOUND STERNUM   Final   Special Requests NO 1 PT ON VANCO LAVEQUIN   Final   Gram Stain     Final   Value: MODERATE WBC PRESENT, PREDOMINANTLY PMN     NO SQUAMOUS EPITHELIAL CELLS SEEN     MODERATE GRAM NEGATIVE RODS     Performed at [facility]   Culture     Final   Value: FEW BACTEROIDES FRAGILIS     Note: BETA LACTAMASE POSITIVE     Performed at [facility]   Report Status 05/21/2013 FINAL   Final  TISSUE CULTURE     Status: None   Collection Time    05/15/13  2:54 PM      Result Value Ref Range Status   Specimen Description TISSUE STERNUM   Final   Special Requests NO 2 PT ON VANCO LAVEQUIN   Final   Gram Stain     Final   Value: MODERATE WBC PRESENT, PREDOMINANTLY PMN     NO ORGANISMS SEEN     Performed at [facility]   Culture     Final   Value: NO GROWTH 3 DAYS     Performed at [facility]   Report Status 05/19/2013 FINAL   Final  TISSUE CULTURE     Status: None   Collection Time    05/15/13  3:03 PM      Result Value Ref Range Status   Specimen Description TISSUE STERNUM   Final   Special Requests NO 3 STERNUM BONE PT ON VANCO LEVEQUIN   Final   Gram Stain     Final   Value: RARE WBC PRESENT, PREDOMINANTLY PMN     NO ORGANISMS SEEN     Performed at [facility]   Culture     Final   Value: NO GROWTH 3 DAYS     Performed at [facility]   Report Status 05/19/2013 FINAL   Final  TISSUE CULTURE     Status: None   Collection Time    05/23/13  2:10 PM      Result Value Ref Range Status   Specimen Description TISSUE BONE   Final   Special Requests STERNAL BONE  PT ON INVANZ   Final   Gram Stain PENDING   Incomplete   Culture     Final   Value: NO GROWTH 1 DAY     Performed at [facility]   Report Status PENDING   Incomplete    Studies/Results: No results found.  Assessment/Plan: 1) sternal wound infection - on ertapenem for 2 weeks.  If picc line is an issue we could trial oral levaquin with oral flagyl and see how he does despite unusual allergy possibly to flagyl.    Scharlene Gloss, MD  [place] for Infectious Disease [email or web address] [phone] pager   [phone] cell 05/24/2013, 8:56 AM

## 2013-05-24 NOTE — Progress Notes (Signed)
1. Acute Kidney Injury appears Hemodynamically mediated, remains nonoliguric and recovering. We are not adding much and will sign off 2. HTN / volume Better BP No antihypertensives ACE or ARB blood pressure is a little soft  Subjective: Interval History: s/p flap yesterday  Objective: Vital signs in last 24 hours: Temp:  [97.2 F (36.2 C)-98.2 F (36.8 C)] 98.2 F (36.8 C) (03/04 0800) Pulse Rate:  [80-100] 89 (03/04 0800) Resp:  [13-34] 16 (03/04 0800) BP: (93-170)/(43-72) 108/60 mmHg (03/04 0800) SpO2:  [87 %-100 %] 100 % (03/04 0800) Weight change:   Intake/Output from previous day: 03/03 0701 - 03/04 0700 In: 2715 [I.V.:2130; Blood:335; IV Piggyback:250] Out: 2398 [Urine:2130; Drains:118; Blood:150] Intake/Output this shift: Total I/O In: 50 [I.V.:50] Out: 185 [Urine:125; Drains:60]  Resp: clear to auscultation bilaterally Chest wall: post op Cardio: regular rate and rhythm  Lab Results:  Recent Labs  05/23/13 0405 05/24/13 0725  WBC 12.5* 17.9*  HGB 8.3* 9.7*  HCT 25.3* 30.0*  PLT 393 382   BMET:  Recent Labs  05/23/13 0405 05/24/13 0725  NA 141 143  K 3.7 4.2  CL 106 106  CO2 24 23  GLUCOSE 96 120*  BUN 16 15  CREATININE 2.15* 1.97*  CALCIUM 7.9* 8.4   No results found for this basename: PTH,  in the last 72 hours Iron Studies: No results found for this basename: IRON, TIBC, TRANSFERRIN, FERRITIN,  in the last 72 hours Studies/Results: No results found.  Scheduled: . atorvastatin  20 mg Oral Daily  . ertapenem  1 g Intravenous Q24H  . feeding supplement (RESOURCE BREEZE)  1 Container Oral TID BM  . montelukast  10 mg Oral Daily  . pantoprazole  40 mg Oral Q1200  . vitamin B-12  1,000 mcg Oral Daily  . vitamin C  1,000 mg Oral Daily  . vitamin E  400 Units Oral Daily     LOS: 10 days   Berk Pilot C 05/24/2013,9:12 AM

## 2013-05-24 NOTE — Progress Notes (Signed)
Attempted to draw labs from [facility] with no successes.  CV[facility] flushes easily but does not draw back blood.  Called lab to notify that they would need to come draw labs.  Lab advised they would send someone.

## 2013-05-24 NOTE — Progress Notes (Signed)
POD # 1 left rectus flap to chest  Pain ok, eating breakfast no nausea  Temp:  [97.2 F (36.2 C)-98.2 F (36.8 C)] 98.2 F (36.8 C) (03/04 0800) Pulse Rate:  [80-100] 83 (03/04 0700) Resp:  [13-34] 14 (03/04 0700) BP: (93-170)/(43-72) 104/58 mmHg (03/04 0700) SpO2:  [87 %-100 %] 99 % (03/04 0700)  JP 30/88  PE Dressings dry, JP serosanguinous Soft distended, appropriate tenderness  A/P: out of bed as tolerated. Consider d/c Foley if Cr continues to improve today IV antibiotics as per ID Will take down dressing in am.    Irene Limbo, MD [facility] Plastic & Reconstructive Surgery [phone]

## 2013-05-24 NOTE — Progress Notes (Signed)
1 Day Post-Op Procedure(s) (LRB): LEFT RECTUS ABDOMINIS MUSCLE FLAP TO CHEST (Left) Subjective: C/o incisional pain  Objective: Vital signs in last 24 hours: Temp:  [97.2 F (36.2 C)-98.1 F (36.7 C)] 98 F (36.7 C) (03/04 0400) Pulse Rate:  [78-100] 83 (03/04 0700) Cardiac Rhythm:  [-] Normal sinus rhythm (03/04 0400) Resp:  [13-34] 14 (03/04 0700) BP: (93-170)/(43-72) 104/58 mmHg (03/04 0700) SpO2:  [87 %-100 %] 99 % (03/04 0700)  Hemodynamic parameters for last 24 hours:    Intake/Output from previous day: 03/03 0701 - 03/04 0700 In: 2715 [I.V.:2130; Blood:335; IV Piggyback:250] Out: 2398 [Urine:2130; Drains:118; Blood:150] Intake/Output this shift:    General appearance: alert and no distress Neurologic: intact Heart: regular rate and rhythm Lungs: diminished breath sounds bilaterally Abdomen: dressing in place  Lab Results:  Recent Labs  05/23/13 0405  WBC 12.5*  HGB 8.3*  HCT 25.3*  PLT 393   BMET:  Recent Labs  05/22/13 1300 05/23/13 0405  NA 136* 141  K 3.4* 3.7  CL 99 106  CO2 25 24  GLUCOSE 139* 96  BUN 16 16  CREATININE 2.16* 2.15*  CALCIUM 8.6 7.9*    PT/INR: No results found for this basename: LABPROT, INR,  in the last 72 hours ABG    Component Value Date/Time   PHART 7.384 04/20/2013 2014   HCO3 23.6 04/20/2013 2014   TCO2 27 04/21/2013 1700   ACIDBASEDEF 1.0 04/20/2013 2014   O2SAT 99.0 04/20/2013 2014   CBG (last 3)  No results found for this basename: GLUCAP,  in the last 72 hours  Assessment/Plan: S/P Procedure(s) (LRB): LEFT RECTUS ABDOMINIS MUSCLE FLAP TO CHEST (Left) - POD # 1 post rectus flap CV- stable  RESP- pulmonary hygiene  RENAL- labs pending  ID- ertrapenem for 2 additional weeks  If creatinine improved will get a PICC line for IV antibiotics and dc central line   LOS: 10 days    Edinson Domeier C 05/24/2013

## 2013-05-24 NOTE — Progress Notes (Signed)
Peripherally Inserted Central Catheter/Midline Placement  The IV Nurse has discussed with the patient and/or persons authorized to consent for the patient, the purpose of this procedure and the potential benefits and risks involved with this procedure.  The benefits include less needle sticks, lab draws from the catheter and patient may be discharged home with the catheter.  Risks include, but not limited to, infection, bleeding, blood clot (thrombus formation), and puncture of an artery; nerve damage and irregular heat beat.  Alternatives to this procedure were also discussed.  PICC/Midline Placement Documentation        Troy [name] 05/24/2013, 5:33 PM

## 2013-05-25 ENCOUNTER — Inpatient Hospital Stay (HOSPITAL_COMMUNITY): Payer: Medicare Other

## 2013-05-25 ENCOUNTER — Encounter (HOSPITAL_COMMUNITY): Payer: Self-pay | Admitting: Plastic Surgery

## 2013-05-25 LAB — BASIC METABOLIC PANEL
BUN: 16 mg/dL (ref 6–23)
CALCIUM: 8.1 mg/dL — AB (ref 8.4–10.5)
CO2: 24 mEq/L (ref 19–32)
CREATININE: 1.68 mg/dL — AB (ref 0.50–1.35)
Chloride: 103 mEq/L (ref 96–112)
GFR, EST AFRICAN AMERICAN: 46 mL/min — AB (ref >90)
GFR, EST NON AFRICAN AMERICAN: 39 mL/min — AB (ref >90)
GLUCOSE: 101 mg/dL — AB (ref 70–99)
POTASSIUM: 3.5 meq/L — AB (ref 3.7–5.3)
Sodium: 139 mEq/L (ref 137–147)

## 2013-05-25 LAB — CBC
HEMATOCRIT: 26.1 % — AB (ref 39.0–52.0)
HEMOGLOBIN: 8.5 g/dL — AB (ref 13.0–17.0)
MCH: 27.6 pg (ref 26.0–34.0)
MCHC: 32.6 g/dL (ref 30.0–36.0)
MCV: 84.7 fL (ref 78.0–100.0)
Platelets: 329 10*3/uL (ref 150–400)
RBC: 3.08 MIL/uL — ABNORMAL LOW (ref 4.22–5.81)
RDW: 16.3 % — AB (ref 11.5–15.5)
WBC: 14.4 10*3/uL — ABNORMAL HIGH (ref 4.0–10.5)

## 2013-05-25 MED ORDER — LACTULOSE 10 GM/15ML PO SOLN
30.0000 g | Freq: Every day | ORAL | Status: DC | PRN
  Filled 2013-05-25: qty 45

## 2013-05-25 NOTE — Progress Notes (Signed)
2 Days Post-Op Procedure(s) (LRB): LEFT RECTUS ABDOMINIS MUSCLE FLAP TO CHEST (Left) Subjective: Having some incisional pain Generally weak Denies SOB  Objective: Vital signs in last 24 hours: Temp:  [98.4 F (36.9 C)-99.4 F (37.4 C)] 98.5 F (36.9 C) (03/05 0759) Pulse Rate:  [50-117] 95 (03/05 0900) Cardiac Rhythm:  [-] Normal sinus rhythm (03/05 0800) Resp:  [11-26] 26 (03/05 0900) BP: (74-112)/(48-69) 96/69 mmHg (03/05 0900) SpO2:  [86 %-100 %] 91 % (03/05 0900) Weight:  [163 lb 2.3 oz (74 kg)] 163 lb 2.3 oz (74 kg) (03/05 0700)  Hemodynamic parameters for last 24 hours:    Intake/Output from previous day: 03/04 0701 - 03/05 0700 In: 1225 [I.V.:1175; IV Piggyback:50] Out: 1625 [VZCHY:8502; Drains:60] Intake/Output this shift: Total I/O In: 100 [I.V.:100] Out: 150 [Urine:150]  General appearance: alert and no distress Neurologic: intact Wound: clean and dry  Lab Results:  Recent Labs  05/24/13 0725 05/25/13 0330  WBC 17.9* 14.4*  HGB 9.7* 8.5*  HCT 30.0* 26.1*  PLT 382 329   BMET:  Recent Labs  05/24/13 0725 05/25/13 0330  NA 143 139  K 4.2 3.5*  CL 106 103  CO2 23 24  GLUCOSE 120* 101*  BUN 15 16  CREATININE 1.97* 1.68*  CALCIUM 8.4 8.1*    PT/INR: No results found for this basename: LABPROT, INR,  in the last 72 hours ABG    Component Value Date/Time   PHART 7.384 04/20/2013 2014   HCO3 23.6 04/20/2013 2014   TCO2 27 04/21/2013 1700   ACIDBASEDEF 1.0 04/20/2013 2014   O2SAT 99.0 04/20/2013 2014   CBG (last 3)  No results found for this basename: GLUCAP,  in the last 72 hours  Assessment/Plan: S/P Procedure(s) (LRB): LEFT RECTUS ABDOMINIS MUSCLE FLAP TO CHEST (Left) - CV- stable BP remains low  RESP- stable  RENAL- ATN resolving, creatinine down to 1.68, dc foley  Continue ertrapenem for bacteroides infection  Deconditioning- PT consult   LOS: 11 days    Chai Routh C 05/25/2013

## 2013-05-25 NOTE — Progress Notes (Signed)
No new issues today  Per RN did better with PT than he had been doing with staff  Continue current care

## 2013-05-25 NOTE — Progress Notes (Signed)
POD # 2 left rectus flap to chest  In chair  Temp:  [98.2 F (36.8 C)-99.4 F (37.4 C)] 98.4 F (36.9 C) (03/05 0400) Pulse Rate:  [50-117] 79 (03/05 0700) Resp:  [11-19] 11 (03/05 0700) BP: (74-113)/(48-72) 108/57 mmHg (03/05 0700) SpO2:  [86 %-100 %] 95 % (03/05 0700) Weight:  [74 kg (163 lb 2.3 oz)] 74 kg (163 lb 2.3 oz) (03/05 0700)  JP 5/55  PE Dressings removed incisions intact with some drainage around old LUQ drain site Abdomen soft  A/P: May leave incisions open to air. Ok to sponge bathe or shower over incisions. D/C jp #1  Irene Limbo, MD [facility] Plastic & Reconstructive Surgery [phone]

## 2013-05-25 NOTE — Evaluation (Signed)
Physical Therapy Evaluation Patient Details Name: Troy [name] MRN: [id number] DOB: [birth year] Today's Date: 05/25/2013 Time: 6962-9528 PT Time Calculation (min): 25 min  PT Assessment / Plan / Recommendation History of Present Illness  73 yo male who underwent CABG x 1 and right upper lobectomy via a median sternotomy on 04/20/13. He has a history of CAD, COPD, lung cancer(T1b, N0- stage IA), HTN, hyperlipidemia, and malnutrition. He had a prolonged air leak after surgery and was finally discharged after that resolved on POD # 15. He also was treated empirically with levaquin for a possible UTI. He has been having fevers for the last 5 days or so. The highest was 101.2, most of the time it only went to about 100. Redness and swelling and what appeared to be an abscess on the lower part of his sternal wound that drained in ED. Pt with sternal wound s/p Left rectus abdominus flap to sternum 3/3  Clinical Impression  Pt very pleasant and able to state that he should not use his arms to lift or push and educated for all precautions as well as sequence for transfers and gait. Pt with below deficits but very motivated to progress and would benefit from acute therapy to maximize mobility, increased strength, gait and function to decrease burden of care at DC. Encouraged daily HEP and ambulation with nursing.     PT Assessment  Patient needs continued PT services    Follow Up Recommendations  Home health PT;Supervision for mobility/OOB    Does the patient have the potential to tolerate intense rehabilitation      Barriers to Discharge        Equipment Recommendations  None recommended by PT    Recommendations for Other Services     Frequency Min 3X/week    Precautions / Restrictions Precautions Precautions: Sternal;Fall Precaution Comments: bulb drain   Pertinent Vitals/Pain HR 89-100 sats 94% on RA Pain 3/10 abdomen      Mobility  Bed Mobility General bed mobility comments: pt in  recliner on arrival and denied return to bed Transfers Overall transfer level: Needs assistance Equipment used: Rolling walker (2 wheeled) Transfers: Sit to/from Stand Sit to Stand: Mod assist General transfer comment: cueing for hand placement, anterior translation and sequence. mod assist for reciprocal scooting to edge of chair but only supervision to scoot back in chair Ambulation/Gait Ambulation/Gait assistance: Min guard Ambulation Distance (Feet): 40 Feet Assistive device: Rolling walker (2 wheeled) Gait Pattern/deviations: Step-to pattern;Antalgic;Decreased weight shift to right;Trunk flexed Gait velocity interpretation: <1.8 ft/sec, indicative of risk for recurrent falls General Gait Details: pt with cues for step to pattern due to Right knee pain. Pt with short step length but able to perform gait with greater ease with this pattern    Exercises General Exercises - Lower Extremity Short Arc Quad: AROM;Seated;Right;10 reps Long Arc Quad: AROM;Seated;Left;10 reps Hip Flexion/Marching: AROM;Seated;Both;10 reps   PT Diagnosis: Difficulty walking;Acute pain  PT Problem List: Decreased strength;Decreased activity tolerance;Decreased knowledge of use of DME;Decreased knowledge of precautions;Pain;Decreased mobility PT Treatment Interventions: Gait training;Stair training;Functional mobility training;Therapeutic activities;Therapeutic exercise;Patient/family education;DME instruction     PT Goals(Current goals can be found in the care plan section) Acute Rehab PT Goals Patient Stated Goal: get back to caring for myself PT Goal Formulation: With patient Time For Goal Achievement: 06/08/13 Potential to Achieve Goals: Good  Visit Information  Last PT Received On: 05/25/13 Assistance Needed: +1 History of Present Illness: 73 yo male who underwent CABG x 1 and right upper lobectomy  via a median sternotomy on 04/20/13. He has a history of CAD, COPD, lung cancer(T1b, N0- stage IA), HTN,  hyperlipidemia, and malnutrition. He had a prolonged air leak after surgery and was finally discharged after that resolved on POD # 15. He also was treated empirically with levaquin for a possible UTI. He has been having fevers for the last 5 days or so. The highest was 101.2, most of the time it only went to about 100. Redness and swelling and what appeared to be an abscess on the lower part of his sternal wound that drained in ED. Pt with sternal wound s/p Left rectus abdominus flap to sternum 3/3       Prior [place] expects to be discharged to:: Private residence Living Arrangements: Other relatives Available Help at Discharge: Family;Available 24 hours/day Type of Home: House Home Access: Stairs to enter [employer] of Steps: 4 Home Layout: One level Home Equipment: Walker - 2 wheels Additional Comments: pt normally lives alone but was staying with brother after CABG and plans to return there until able to live alone again Prior Function Level of Independence: Independent with assistive device(s) Communication Communication: No difficulties    Cognition  Cognition Arousal/Alertness: Awake/alert Behavior During Therapy: WFL for tasks assessed/performed Overall Cognitive Status: Within Functional Limits for tasks assessed    Extremity/Trunk Assessment Upper Extremity Assessment Upper Extremity Assessment: Generalized weakness Lower Extremity Assessment Lower Extremity Assessment: Generalized weakness Cervical / Trunk Assessment Cervical / Trunk Assessment: Kyphotic   Balance    End of Session PT - End of Session Equipment Utilized During Treatment: Gait belt Activity Tolerance: Patient tolerated treatment well Patient left: in chair;with call bell/phone within reach;with family/visitor present Nurse Communication: Mobility status  GP     Melford Aase 05/25/2013, 11:40 AM Elwyn Reach, [place]

## 2013-05-26 LAB — TISSUE CULTURE
CULTURE: NO GROWTH
GRAM STAIN: NONE SEEN

## 2013-05-26 MED ORDER — SORBITOL 70 % SOLN
960.0000 mL | TOPICAL_OIL | Freq: Once | ORAL | Status: AC
  Administered 2013-05-26: 960 mL via RECTAL
  Filled 2013-05-26: qty 240

## 2013-05-26 NOTE — Progress Notes (Signed)
POD # 3 left rectus flap to chest  Worked with PT  Temp:  [97.6 F (36.4 C)-98.5 F (36.9 C)] 98.2 F (36.8 C) (03/06 0759) Pulse Rate:  [66-101] 87 (03/06 0700) Resp:  [10-25] 25 (03/06 0700) BP: (78-115)/(49-72) 89/61 mmHg (03/06 0700) SpO2:  [91 %-100 %] 96 % (03/06 0700) Weight:  [73.1 kg (161 lb 2.5 oz)] 73.1 kg (161 lb 2.5 oz) (03/06 0500)  JP no output recorded last 24 hrs.  PE  incisions intact,   old LUQ drain site with few mm erythema- consistent with wound healing Abdomen soft fullness (expected) in area of flap LUQ  A/P: May leave incisions open to air. Ok to sponge bathe or shower over incisions. Likely transfer out of ICU Discharge planning- consult to CM for arranging IV abx, PT at home.  Irene Limbo, MD [facility] Plastic & Reconstructive Surgery [phone]

## 2013-05-26 NOTE — Progress Notes (Addendum)
[place] for Infectious Disease  Date of Admission:  05/14/2013  Antibiotics: ertapenem  Subjective: No complaints  Objective: Temp:  [97.6 F (36.4 C)-98.5 F (36.9 C)] 98.2 F (36.8 C) (03/06 0759) Pulse Rate:  [66-102] 89 (03/06 0940) Resp:  [10-25] 13 (03/06 0940) BP: (78-115)/(49-72) 87/56 mmHg (03/06 0940) SpO2:  [91 %-100 %] 100 % (03/06 0940) Weight:  [161 lb 2.5 oz (73.1 kg)] 161 lb 2.5 oz (73.1 kg) (03/06 0500)  General: awake, alert, nad Skin: no rashes Lungs: CTA B Cor: RRR without m Abdomen: soft, mild tenderness, nd Ext: no edema  Lab Results Lab Results  Component Value Date   WBC 14.4* 05/25/2013   HGB 8.5* 05/25/2013   HCT 26.1* 05/25/2013   MCV 84.7 05/25/2013   PLT 329 05/25/2013    Lab Results  Component Value Date   CREATININE 1.68* 05/25/2013   BUN 16 05/25/2013   NA 139 05/25/2013   K 3.5* 05/25/2013   CL 103 05/25/2013   CO2 24 05/25/2013    Lab Results  Component Value Date   ALT 12 04/18/2013   AST 17 04/18/2013   ALKPHOS 90 04/18/2013   BILITOT 0.3 04/18/2013      Microbiology: Recent Results (from the past 240 hour(s))  TISSUE CULTURE     Status: None   Collection Time    05/23/13  2:10 PM      Result Value Ref Range Status   Specimen Description TISSUE BONE   Final   Special Requests STERNAL BONE  PT ON INVANZ   Final   Gram Stain     Final   Value: NO WBC SEEN     NO ORGANISMS SEEN     Performed at [facility]   Culture     Final   Value: NO GROWTH 3 DAYS     Performed at [facility]   Report Status 05/26/2013 FINAL   Final  ANAEROBIC CULTURE     Status: None   Collection Time    05/23/13  2:10 PM      Result Value Ref Range Status   Specimen Description TISSUE BONE   Final   Special Requests STERNAL BONE  PT ON INVANZ   Final   Gram Stain     Final   Value: NO WBC SEEN     NO ORGANISMS SEEN     Performed at [facility]   Culture     Final   Value: NO ANAEROBES ISOLATED; CULTURE IN PROGRESS FOR 5  DAYS     Performed at [facility]   Report Status PENDING   Incomplete    Studies/Results: Dg Chest Port 1 View  05/25/2013   CLINICAL DATA:  Status post sternal infection  EXAM: PORTABLE CHEST - 1 VIEW  COMPARISON:  DG CHEST 1V PORT dated 05/20/2013; CT ANGIO CHEST W/CM &/OR WO/CM dated 04/28/2013  FINDINGS: Left PICC line unchanged. Removal of right central venous line. Sternotomy wires overlie normal cardiac silhouette. No effusion, infiltrate, or pneumothorax. Mild chronic interstitial markings.  IMPRESSION: No acute cardiopulmonary process.   Electronically Signed   By: Suzy Bouchard M.D.   On: 05/25/2013 08:40    Assessment/Plan: 1) sternal wound infection - + Bacteroides, + allergies, on ertapenem for 2 weeks through March 16th.  Has picc.   -we will arrange follow up in [facility] on or around the 16th.  -antibiotics per home health protocol. -Please leave picc in place until seen  by MD in [facility] Thanks for consult, call with questions.   Scharlene Gloss, [place] for Infectious Disease [place] [email or web address] [phone] pager   [phone] cell 05/26/2013, 10:10 AM

## 2013-05-26 NOTE — Progress Notes (Addendum)
TCTS BRIEF SICU PROGRESS NOTE  3 Days Post-Op  S/P Procedure(s) (LRB): LEFT RECTUS ABDOMINIS MUSCLE FLAP TO CHEST (Left)   Stable day Ambulated around unit x3 + BM  Plan: Continue current plan  OWEN,CLARENCE H 05/26/2013 6:47 PM

## 2013-05-26 NOTE — Progress Notes (Signed)
Pt already active with [facility] for nursing, notified liaison that pt will discharge on IV antibx, and will need PT and OT.  Pt will be staying with his brother and sister-in-law.  Brother works but sister-in-law states she will be available and her dtr lives next door, will also assist as needed.  They have a rolling walker and w/c for pt to use PRN.

## 2013-05-26 NOTE — Progress Notes (Signed)
3 Days Post-Op Procedure(s) (LRB): LEFT RECTUS ABDOMINIS MUSCLE FLAP TO CHEST (Left) Subjective: Feels a little better today Still having right knee pain  Objective: Vital signs in last 24 hours: Temp:  [97.6 F (36.4 C)-98.5 F (36.9 C)] 98.2 F (36.8 C) (03/06 0759) Pulse Rate:  [66-102] 89 (03/06 0940) Cardiac Rhythm:  [-] Normal sinus rhythm (03/06 0940) Resp:  [10-25] 13 (03/06 0940) BP: (78-115)/(49-72) 87/56 mmHg (03/06 0940) SpO2:  [91 %-100 %] 100 % (03/06 0940) Weight:  [161 lb 2.5 oz (73.1 kg)] 161 lb 2.5 oz (73.1 kg) (03/06 0500)  Hemodynamic parameters for last 24 hours:    Intake/Output from previous day: 03/05 0701 - 03/06 0700 In: 1105 [P.O.:300; I.V.:755; IV Piggyback:50] Out: 3474 [QVZDG:3875] Intake/Output this shift: Total I/O In: 250 [P.O.:200; I.V.:50] Out: -   General appearance: alert and no distress Neurologic: intact Heart: regular rate and rhythm Lungs: diminished breath sounds bilaterally Wound: intact with minimal erythema  Lab Results:  Recent Labs  05/24/13 0725 05/25/13 0330  WBC 17.9* 14.4*  HGB 9.7* 8.5*  HCT 30.0* 26.1*  PLT 382 329   BMET:  Recent Labs  05/24/13 0725 05/25/13 0330  NA 143 139  K 4.2 3.5*  CL 106 103  CO2 23 24  GLUCOSE 120* 101*  BUN 15 16  CREATININE 1.97* 1.68*  CALCIUM 8.4 8.1*    PT/INR: No results found for this basename: LABPROT, INR,  in the last 72 hours ABG    Component Value Date/Time   PHART 7.384 04/20/2013 2014   HCO3 23.6 04/20/2013 2014   TCO2 27 04/21/2013 1700   ACIDBASEDEF 1.0 04/20/2013 2014   O2SAT 99.0 04/20/2013 2014   CBG (last 3)  No results found for this basename: GLUCAP,  in the last 72 hours  Assessment/Plan: S/P Procedure(s) (LRB): LEFT RECTUS ABDOMINIS MUSCLE FLAP TO CHEST (Left) - Stable, but progress is slow Wounds OK, Drains per Dr. Iran Planas Day 3/14 postop ertrapenem Malnutrition- push PO/ supplements Deconditioning- PT Has not had BM in 6 days- enema  if no BM this morning    LOS: 12 days    Shanara Schnieders C 05/26/2013

## 2013-05-27 ENCOUNTER — Inpatient Hospital Stay (HOSPITAL_COMMUNITY): Payer: Medicare Other

## 2013-05-27 LAB — TYPE AND SCREEN
ABO/RH(D): O POS
Antibody Screen: NEGATIVE
Unit division: 0
Unit division: 0

## 2013-05-27 LAB — BASIC METABOLIC PANEL
BUN: 13 mg/dL (ref 6–23)
CALCIUM: 8.7 mg/dL (ref 8.4–10.5)
CO2: 25 mEq/L (ref 19–32)
Chloride: 104 mEq/L (ref 96–112)
Creatinine, Ser: 1.47 mg/dL — ABNORMAL HIGH (ref 0.50–1.35)
GFR, EST AFRICAN AMERICAN: 54 mL/min — AB (ref >90)
GFR, EST NON AFRICAN AMERICAN: 46 mL/min — AB (ref >90)
Glucose, Bld: 99 mg/dL (ref 70–99)
POTASSIUM: 3.8 meq/L (ref 3.7–5.3)
Sodium: 143 mEq/L (ref 137–147)

## 2013-05-27 LAB — CBC
HCT: 29.4 % — ABNORMAL LOW (ref 39.0–52.0)
Hemoglobin: 9.8 g/dL — ABNORMAL LOW (ref 13.0–17.0)
MCH: 28.5 pg (ref 26.0–34.0)
MCHC: 33.3 g/dL (ref 30.0–36.0)
MCV: 85.5 fL (ref 78.0–100.0)
Platelets: 403 10*3/uL — ABNORMAL HIGH (ref 150–400)
RBC: 3.44 MIL/uL — ABNORMAL LOW (ref 4.22–5.81)
RDW: 16.3 % — AB (ref 11.5–15.5)
WBC: 12.8 10*3/uL — ABNORMAL HIGH (ref 4.0–10.5)

## 2013-05-27 NOTE — Progress Notes (Signed)
Pt arrived from [place], placed in chair, VSS, elink and CMT notified, oriented to unit and routine, call bell within reach. Brother is at bedside.

## 2013-05-27 NOTE — Progress Notes (Signed)
Transferred to 3S08 via wheelchair. Portable monitor on. No changes. Report given to Cherly Beach, RN.

## 2013-05-27 NOTE — Progress Notes (Signed)
      [address]       [place] [address]             [phone]        CARDIOTHORACIC SURGERY PROGRESS NOTE   R4 Days Post-Op Procedure(s) (LRB): LEFT RECTUS ABDOMINIS MUSCLE FLAP TO CHEST (Left)  Subjective: No complaints  Objective: Vital signs: BP Readings from Last 1 Encounters:  05/27/13 100/63   Pulse Readings from Last 1 Encounters:  05/27/13 89   Resp Readings from Last 1 Encounters:  05/27/13 12   Temp Readings from Last 1 Encounters:  05/27/13 97.8 F (36.6 C) Oral    Hemodynamics:    Physical Exam:  Rhythm:   sinus  Breath sounds: clear  Heart sounds:  RRR  Incisions:  Dressings dry, intact  Abdomen:  Soft, non-distended, non-tender  Extremities:  Warm, well-perfused    Intake/Output from previous day: 03/06 0701 - 03/07 0700 In: 1160 [P.O.:620; I.V.:490; IV Piggyback:50] Out: 1560 [Urine:1550; Drains:10] Intake/Output this shift: Total I/O In: 20 [I.V.:20] Out: -   Lab Results:  CBC: Recent Labs  05/25/13 0330 05/27/13 0500  WBC 14.4* 12.8*  HGB 8.5* 9.8*  HCT 26.1* 29.4*  PLT 329 403*    BMET:  Recent Labs  05/25/13 0330 05/27/13 0500  NA 139 143  K 3.5* 3.8  CL 103 104  CO2 24 25  GLUCOSE 101* 99  BUN 16 13  CREATININE 1.68* 1.47*  CALCIUM 8.1* 8.7     CBG (last 3)  No results found for this basename: GLUCAP,  in the last 72 hours  ABG    Component Value Date/Time   PHART 7.384 04/20/2013 2014   PCO2ART 39.5 04/20/2013 2014   PO2ART 148.0* 04/20/2013 2014   HCO3 23.6 04/20/2013 2014   TCO2 27 04/21/2013 1700   ACIDBASEDEF 1.0 04/20/2013 2014   O2SAT 99.0 04/20/2013 2014    CXR: CLINICAL DATA: Atelectasis  EXAM:  PORTABLE CHEST - 1 VIEW  COMPARISON: 05/25/2013  FINDINGS:  Status post median sternotomy and CABG procedure. The heart size  appears normal. There is no pleural effusion or edema. No airspace  consolidation identified. Persistent nodular opacity within the left  upper lobe  which is new from 04/28/2013 and may represent an area of  pneumonia.  IMPRESSION:  Persistent left upper lobe opacity compatible with pneumonia.  Electronically Signed  By: Kerby Moors M.D.  On: 05/27/2013 08:49    Assessment/Plan: S/P Procedure(s) (LRB): LEFT RECTUS ABDOMINIS MUSCLE FLAP TO CHEST (Left)  Clinically stable Constipation resolved yesterday Small opacity LUL on CXR w/out clinical signs of pneumonia Malnutrition Deconditioning   Mobilize  Nutritional supplements  Awaiting bed for transfer  Dreama Kuna H 05/27/2013 11:27 AM

## 2013-05-27 NOTE — Progress Notes (Signed)
POD # 4 left rectus flap to chest  CXR with opacity LUL; no fevers or elevated WBC or cough Aerobic culture with no growth ; anaerobic culture sternum NGTD   Temp:  [97.8 F (36.6 C)-99 F (37.2 C)] 97.8 F (36.6 C) (03/07 1147) Pulse Rate:  [65-101] 89 (03/07 1000) Resp:  [10-19] 12 (03/07 1000) BP: (83-111)/(47-70) 100/63 mmHg (03/07 0800) SpO2:  [81 %-100 %] 81 % (03/07 1000) Weight:  [73.3 kg (161 lb 9.6 oz)] 73.3 kg (161 lb 9.6 oz) (03/07 0500)  JP 10 cc  PE  incisions intact,   old LUQ drain site with few mm erythema- consistent with wound healing Abdomen soft fullness (expected) in area of flap LUQ  A/P: May leave incisions open to air. Ok to sponge bathe or shower over incisions. D/c JP #2 Staples out appr POD#10   Irene Limbo, MD [facility] Plastic & Reconstructive Surgery [phone]

## 2013-05-28 LAB — ANAEROBIC CULTURE: Gram Stain: NONE SEEN

## 2013-05-28 NOTE — Progress Notes (Addendum)
       [address]       [place] [address]             [phone]          5 Days Post-Op Procedure(s) (LRB): LEFT RECTUS ABDOMINIS MUSCLE FLAP TO CHEST (Left)  Subjective: Feels well, no complaints except weakness.   Objective: Vital signs in last 24 hours: Patient Vitals for the past 24 hrs:  BP Temp Temp src Pulse Resp SpO2  05/28/13 0700 - 98.3 F (36.8 C) Oral - - -  05/28/13 0435 96/54 mmHg 98.8 F (37.1 C) Oral 74 12 96 %  05/27/13 2321 95/59 mmHg 99.2 F (37.3 C) Oral 79 14 100 %  05/27/13 1941 119/68 mmHg 98.2 F (36.8 C) Oral 101 24 93 %  05/27/13 1752 115/68 mmHg 98.1 F (36.7 C) Oral - - 100 %  05/27/13 1600 100/62 mmHg - - 74 13 100 %  05/27/13 1545 - 98.4 F (36.9 C) Oral - - -  05/27/13 1300 - - - 79 18 100 %  05/27/13 1200 119/75 mmHg - - - 14 100 %  05/27/13 1147 - 97.8 F (36.6 C) Oral - - -  05/27/13 1000 - - - 89 12 81 %  05/27/13 0900 - - - 88 15 100 %   Current Weight  05/27/13 161 lb 9.6 oz (73.3 kg)     Intake/Output from previous day: 03/07 0701 - 03/08 0700 In: 20 [P.O.:840; I.V.:180; IV Piggyback:50] Out: 1904 [Urine:1900; Drains:3; Stool:1]    PHYSICAL EXAM:  Heart: RRR Lungs: Slightly decreased BS in bases,overall clear Wound: Clean and dry Extremities: No edema    Lab Results: CBC: Recent Labs  05/27/13 0500  WBC 12.8*  HGB 9.8*  HCT 29.4*  PLT 403*   BMET:  Recent Labs  05/27/13 0500  NA 143  K 3.8  CL 104  CO2 25  GLUCOSE 99  BUN 13  CREATININE 1.47*  CALCIUM 8.7    PT/INR: No results found for this basename: LABPROT, INR,  in the last 72 hours    Assessment/Plan: S/P Procedure(s) (LRB): LEFT RECTUS ABDOMINIS MUSCLE FLAP TO CHEST (Left) Rectus flap POD#5- Wound healing well, all drains out now.  Staples to stay in until POD#10.  Per plastics. Stable otherwise. Repeat CXR in am to follow up LUL opacity. WBC stable, trending down. On Invanz postop. Continue pulm toilet,  ambulation.   LOS: 14 days    COLLINS,GINA H 05/28/2013  I have seen and examined the patient and agree with the assessment and plan as outlined.  Hayla Hinger H 05/28/2013 11:50 AM

## 2013-05-29 ENCOUNTER — Inpatient Hospital Stay (HOSPITAL_COMMUNITY): Payer: Medicare Other

## 2013-05-29 LAB — CBC
HCT: 25.3 % — ABNORMAL LOW (ref 39.0–52.0)
Hemoglobin: 8.2 g/dL — ABNORMAL LOW (ref 13.0–17.0)
MCH: 27.4 pg (ref 26.0–34.0)
MCHC: 32.4 g/dL (ref 30.0–36.0)
MCV: 84.6 fL (ref 78.0–100.0)
Platelets: 413 10*3/uL — ABNORMAL HIGH (ref 150–400)
RBC: 2.99 MIL/uL — ABNORMAL LOW (ref 4.22–5.81)
RDW: 16.1 % — AB (ref 11.5–15.5)
WBC: 9.4 10*3/uL (ref 4.0–10.5)

## 2013-05-29 LAB — BASIC METABOLIC PANEL
BUN: 12 mg/dL (ref 6–23)
CALCIUM: 8.2 mg/dL — AB (ref 8.4–10.5)
CHLORIDE: 104 meq/L (ref 96–112)
CO2: 26 mEq/L (ref 19–32)
Creatinine, Ser: 1.34 mg/dL (ref 0.50–1.35)
GFR calc non Af Amer: 52 mL/min — ABNORMAL LOW (ref >90)
GFR, EST AFRICAN AMERICAN: 60 mL/min — AB (ref >90)
Glucose, Bld: 85 mg/dL (ref 70–99)
Potassium: 3.3 mEq/L — ABNORMAL LOW (ref 3.7–5.3)
Sodium: 143 mEq/L (ref 137–147)

## 2013-05-29 MED ORDER — HYDROCODONE-ACETAMINOPHEN 10-325 MG PO TABS
2.0000 | ORAL_TABLET | ORAL | Status: DC | PRN
  Administered 2013-05-29: 1 via ORAL
  Filled 2013-05-29: qty 1

## 2013-05-29 MED ORDER — HYDROCODONE-ACETAMINOPHEN 10-325 MG PO TABS
1.0000 | ORAL_TABLET | ORAL | Status: DC | PRN
  Administered 2013-05-29 – 2013-05-30 (×3): 1 via ORAL
  Filled 2013-05-29 (×3): qty 1

## 2013-05-29 NOTE — Care Management Note (Addendum)
Page 1 of 2   05/31/2013     2:09:23 PM   CARE MANAGEMENT NOTE 05/31/2013  Patient:  Troy [name],Troy [name]   Account Number:  [id number]  Date Initiated:  05/15/2013  Documentation initiated by:  Elissa Hefty  Subjective/Objective Assessment:   adm w wound dehiscense     Action/Plan:   lives alone, act w [facility] for hhrn   Anticipated DC Date:  05/31/2013   Anticipated DC Plan:  [place]  CM consult      [facility] Choice  Resumption Of Svcs/PTA Provider   Choice offered to / List presented to:  C-1 Patient   DME arranged  IV PUMP/EQUIPMENT      DME agency  [place] arranged  HH-1 RN      [place].   Status of service:  Completed, signed off Medicare Important Message given?   (If response is "NO", the following Medicare IM given date fields will be blank) Date Medicare IM given:   Date Additional Medicare IM given:    Discharge Disposition:  [place]  Per UR Regulation:  Reviewed for med. necessity/level of care/duration of stay  If discussed at [place] of Stay Meetings, dates discussed:   05/18/2013  05/23/2013  05/25/2013  05/30/2013    Comments:  ContactBoby Eyer, brother  [phone], [phone]  05/31/13 Naysha Sholl,RN,BSN [phone] [facility] [place]; [place].  INVANZ NOT COVERED BY PT'S INSURANCE, AND WOULD HAVE COST PT OVER $500 FOR LESS THAN ONE WEEK'S THERAPY.  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  START OF CARE IN AM.  05-30-13 [place], RN,BSN [phone] CM did call [place] to make them aware of IV ABX. [facility] is following the pt. No further needs from CM at this time.  05-29-13 [place], [place] [phone]  Plan for Home with IV ABXd when stable. CM will continue to monitor for disposititon needs.  05/26/13 [place] MSN BSN CCM Pt now states he wants to discharge home with brother - does not want to consider rehab options.  Per staff, pt unable to ambulate independently due to arthritic knee.  CM asked brother if he would be able to assist pt and he indicated he had been taking care of him off and on for some time and thinks this would be no different.  Pt will need home IV antibx and brother states he is also willing to work with [facility] and learn to administer antibx.  Notified [facility] liaison that pt will need home PT/OT and IV antibx. 35 CM learned that pt's brother works, talked with pt's sister-in-law who states she does not work and she or her dtr will be available to assist as needed-dtr lives next door.  She has a rolling walker and w/c for pt's use as well.  05/23/13 [place] RN MSN BSN CCM S/P debridement and flap.  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  LTAC evals requested. 1120 Talked with pt and brother about potential rehab options.  Pt's hope is that he can go home with his brother with home health following but is willing to consider other options.  05/18/13 [place]  RN MSN BSN CCM VAC change and sternal wound debridement.  05/15/13 [place] RN MSN BSN CCM S/P Sternal wound debridement and VAC placement.

## 2013-05-29 NOTE — Progress Notes (Signed)
      [address]       [place] [address]             [phone]      6 Days Post-Op Procedure(s) (LRB): LEFT RECTUS ABDOMINIS MUSCLE FLAP TO CHEST (Left)  Subjective:  Troy [name] is without complaints this morning.  Objective: Vital signs in last 24 hours: Temp:  [97.7 F (36.5 C)-98.7 F (37.1 C)] 97.7 F (36.5 C) (03/09 0726) Pulse Rate:  [75-87] 76 (03/09 0726) Cardiac Rhythm:  [-] Normal sinus rhythm (03/09 0730) Resp:  [14-19] 18 (03/09 0726) BP: (97-125)/(50-77) 108/57 mmHg (03/09 0726) SpO2:  [97 %-100 %] 100 % (03/09 0726)  Intake/Output from previous day: 03/08 0701 - 03/09 0700 In: 1200 [P.O.:1200] Out: 1 [Urine:1] Intake/Output this shift: Total I/O In: 480 [P.O.:480] Out: -   General appearance: alert, cooperative and no distress Heart: regular rate and rhythm Lungs: clear to auscultation bilaterally Abdomen: soft, non-tender; mildly distended at area of incision Wound: clean and dry, staples in place  Lab Results:  Recent Labs  05/27/13 0500 05/29/13 0500  WBC 12.8* 9.4  HGB 9.8* 8.2*  HCT 29.4* 25.3*  PLT 403* 413*   BMET:  Recent Labs  05/27/13 0500 05/29/13 0500  NA 143 143  K 3.8 3.3*  CL 104 104  CO2 25 26  GLUCOSE 99 85  BUN 13 12  CREATININE 1.47* 1.34  CALCIUM 8.7 8.2*    PT/INR: No results found for this basename: LABPROT, INR,  in the last 72 hours ABG    Component Value Date/Time   PHART 7.384 04/20/2013 2014   HCO3 23.6 04/20/2013 2014   TCO2 27 04/21/2013 1700   ACIDBASEDEF 1.0 04/20/2013 2014   O2SAT 99.0 04/20/2013 2014   CBG (last 3)  No results found for this basename: GLUCAP,  in the last 72 hours  Assessment/Plan: S/P Procedure(s) (LRB): LEFT RECTUS ABDOMINIS MUSCLE FLAP TO CHEST (Left)  1. POD #6, wound healing well, no drains in place- plastics evaluated today and felt okay to take staples out tomorrow or prior to discharge 2. ID- no leukocytosis, CXR shows decrease in opacity in left  lung on [place] 3. Dispo- continue current care,   LOS: 15 days    Kaitlin Ardito, Junie Panning 05/29/2013

## 2013-05-29 NOTE — Progress Notes (Signed)
Physical Therapy Treatment Patient Details Name: Troy [name] MRN: [id number] DOB: Nov 29, 1940 Today's Date: 05/29/2013 Time: 5462-7035 PT Time Calculation (min): 48 min  PT Assessment / Plan / Recommendation  History of Present Illness 73 yo male who underwent CABG x 1 and right upper lobectomy via a median sternotomy on 04/20/13. He has a history of CAD, COPD, lung cancer(T1b, N0- stage IA), HTN, hyperlipidemia, and malnutrition. He had a prolonged air leak after surgery and was finally discharged after that resolved on POD # 15. He also was treated empirically with levaquin for a possible UTI. He has been having fevers for the last 5 days or so. The highest was 101.2, most of the time it only went to about 100. Redness and swelling and what appeared to be an abscess on the lower part of his sternal wound that drained in ED. Pt with sternal wound s/p Left rectus abdominus flap to sternum 3/3   PT Comments   Pt with good awareness of his deficits and how to progress his activity to maximize improving strength and endurance without overdoing activity and further aggravating his Rt knee. He anticipates he will discharge to his brother's home in the next few days and denies need to practice up/down steps to enter his home. (He is able to correctly verbalize technique and fortunately has bil rails). Agrees to HHPT for home safety eval.    Follow Up Recommendations  Home health PT;Supervision for mobility/OOB     Does the patient have the potential to tolerate intense rehabilitation     Barriers to Discharge        Equipment Recommendations  None recommended by PT    Recommendations for Other Services    Frequency Min 3X/week   Progress towards PT Goals Progress towards PT goals: Progressing toward goals  Plan Current plan remains appropriate    Precautions / Restrictions Precautions Precautions: Sternal;Fall   Pertinent Vitals/Pain HR 100s, SaO2 100% on RA Pain 7/10 Rt knee is greater than  his chest/incisional pain.    Mobility  Bed Mobility General bed mobility comments: pt in recliner on arrival and denied return to bed Transfers Overall transfer level: Needs assistance Equipment used: Rolling walker (2 wheeled) Transfers: Sit to/from Stand Sit to Stand: Supervision General transfer comment: vc for safe use of RW (pt initially wanting to keep hands on hand grips as coming to stand with RW tipping posteriorly) Ambulation/Gait Ambulation/Gait assistance: Supervision Ambulation Distance (Feet): 180 Feet Assistive device: Rolling walker (2 wheeled) Gait Pattern/deviations: Step-to pattern;Antalgic Gait velocity interpretation: Below normal speed for age/gender General Gait Details: pt continues with chronic Rt knee pain and independently recalled proper gait pattern and use of RW for support Stairs:  (pt deferred; able to state proper technique)    Exercises General Exercises - Lower Extremity Ankle Circles/Pumps: AROM;Both;20 reps;Seated Quad Sets: AROM;Both;10 reps;Seated Long Arc Quad: AROM;Both;5 reps;Seated Hip Flexion/Marching: AROM;Both;5 reps;Seated Other Exercises Other Exercises: sit to stand x 2 from chair; x 3 from bed (slightly higher and less painful for his knee)   PT Diagnosis:    PT Problem List:   PT Treatment Interventions:     PT Goals (current goals can now be found in the care plan section) Acute Rehab PT Goals Patient Stated Goal: get back to caring for myself  Visit Information  Last PT Received On: 05/29/13 Assistance Needed: +1 History of Present Illness: 73 yo male who underwent CABG x 1 and right upper lobectomy via a median sternotomy on 04/20/13. He  has a history of CAD, COPD, lung cancer(T1b, N0- stage IA), HTN, hyperlipidemia, and malnutrition. He had a prolonged air leak after surgery and was finally discharged after that resolved on POD # 15. He also was treated empirically with levaquin for a possible UTI. He has been having  fevers for the last 5 days or so. The highest was 101.2, most of the time it only went to about 100. Redness and swelling and what appeared to be an abscess on the lower part of his sternal wound that drained in ED. Pt with sternal wound s/p Left rectus abdominus flap to sternum 3/3    Subjective Data  Subjective: Pt able to verbalize how to ascend steps at his brothers and does not want to practice prior to d/c Patient Stated Goal: get back to caring for myself   Cognition  Cognition Arousal/Alertness: Awake/alert Behavior During Therapy: WFL for tasks assessed/performed Overall Cognitive Status: Within Functional Limits for tasks assessed    Balance  General Comments General comments (skin integrity, edema, etc.): Pt demonstrated good understanding of need to do enough activity to continue to regain his strength, and not so much to further aggravate his Rt knee pain. Pt states ortho was consulted and cannot drain fluid from knee (not enough) and cannot inject with steroids due to risk of sternal flap failure  End of Session PT - End of Session Activity Tolerance: Patient tolerated treatment well Patient left: in chair;with call bell/phone within reach   GP     Annah Jasko 05/29/2013, 5:07 PM Pager [phone]

## 2013-05-29 NOTE — Progress Notes (Signed)
POD # 6 left rectus flap to chest  Cultures with no growth on both aerobic and anaerobic Cr 1.34 WBC 9.4  CXR with improved aeration LUL noted  Temp:  [97.7 F (36.5 C)-98.7 F (37.1 C)] 97.7 F (36.5 C) (03/09 0726) Pulse Rate:  [75-87] 76 (03/09 0726) Resp:  [14-19] 18 (03/09 0726) BP: (97-125)/(50-77) 108/57 mmHg (03/09 0726) SpO2:  [97 %-100 %] 100 % (03/09 0726)   PE  incisions intact,  Abdomen soft fullness (expected) in area of flap LUQ No drainage  A/P: Doing well. May leave incisions open to air. Ok to sponge bathe or shower over incisions. Staples can likely come out tomorrow or at least prior to discharge.    Irene Limbo, MD [facility] [phone]

## 2013-05-30 MED ORDER — POTASSIUM CHLORIDE CRYS ER 20 MEQ PO TBCR
20.0000 meq | EXTENDED_RELEASE_TABLET | Freq: Two times a day (BID) | ORAL | Status: DC
  Administered 2013-05-30 (×2): 20 meq via ORAL
  Filled 2013-05-30 (×4): qty 1

## 2013-05-30 MED ORDER — FUROSEMIDE 40 MG PO TABS
40.0000 mg | ORAL_TABLET | Freq: Every day | ORAL | Status: DC
  Administered 2013-05-30 – 2013-05-31 (×2): 40 mg via ORAL
  Filled 2013-05-30 (×2): qty 1

## 2013-05-30 NOTE — Progress Notes (Signed)
POD # 7 left rectus flap to chest  No events, awaiting transfer to 2W  Temp:  [97.8 F (36.6 C)-98.6 F (37 C)] 97.9 F (36.6 C) (03/10 1142) Pulse Rate:  [70-85] 72 (03/10 0707) Resp:  [12-21] 21 (03/10 0707) BP: (96-100)/(52-63) 98/59 mmHg (03/10 0707) SpO2:  [96 %-100 %] 100 % (03/10 0707)   PE  incisions intact,  Abdomen soft  No drainage  A/P: D/c staples today. Anticipating d/c in next 1-2 days. Can f/u with me 1 week post d/c in office.   Irene Limbo, MD [facility]astic [facility] [phone]

## 2013-05-30 NOTE — Discharge Summary (Signed)
[address]       [place] [address]             [phone]              Discharge Summary  Name: Troy [name] DOB: Aug 25, 1940 73 y.o. MRN: [id number]   Admission Date: 05/14/2013 Discharge Date: 05/31/2013    Admitting Diagnosis: Sternal wound infection   Discharge Diagnosis:  Bacteroides sternal wound infection Acute renal failure due to acute kidney injury/acute tubular necrosis Degenerative joint disease Expected postoperative blood loss anemia  Past Medical History  Diagnosis Date  . CAD (coronary artery disease)   . HTN (hypertension)   . COPD (chronic obstructive pulmonary disease)   . Hyperlipidemia   . Depression   . Vitamin D deficiency   . Sleep apnea   . Anginal pain   . Shortness of breath   . Arthritis   . 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 STERNAL WOUND DEBRIDEMENT AND VAC PLACEMENT - 05/15/2013  2. STERNAL VAC CHANGE WITH STERNAL DEBRIDEMENT - 05/17/2013  3. LEFT RECTUS ABDOMINIS MUSCLE FLAP TO CHEST  - 05/23/2013     HPI:  The patient is a 73 y.o. male known to [facility] from off pump CABG x 1 by Dr. Roxan Hockey on 04/20/2013.  He also underwent a right upper lobectomy for Stage Ia lung cancer at that time.  He had a prolonged postoperative course due to a persistent air leak, but this did ultimately resolve.  He also was treated during that admission for a UTI.  He was discharged home in stable condition on 04/28/2013.  He presented to the ER at [facility] on the date of this admission complaining of fever over the preceding 5 days or so.  Initially, there was no obvious source for infection, but he noted redness and swelling of the lower portion of his sternal wound on the date of admission, with an area of what appeared to be an abscess.  He called our office and was instructed to come to the ER.  On evaluation, he was noted to have purulent drainage from the  area.  Dr. Roxan Hockey saw the patient, and he was admitted for wound care and IV antibiotic therapy.    Hospital Course:  The patient was admitted to [facility] on 05/14/2013. The wound drainage was cultured, and he was started empirically on Vancomycin. It was felt that his wound would require exploration in the OR, with possible VAC placement.   The patient was taken to the operating room and underwent the above procedure. Intraoperatively, there was drainage noted through the lower portion of the sternum extending into the mediastinum. A VAC was placed and he was taken to the SICU for postoperative management.  He returned to the OR for VAC change on 2/25.  The postoperative course was notable for acute renal insufficiency with episodes of hypotension.  A renal consult was requested and Dr. Justin Mend saw the patient. This was felt to be related to acute tubular necrosis, and was monitored closely and treated conservatively.  Vancomycin was discontinued and he was kept on IV Levaquin. He also complained of right knee pain and difficulty with weight bearing, for which total knee replacement had previously been recommended.  Dr. Percell Miller saw the patient in consult and recommended continued NSAID therapy, ice and elevation. Plastic surgery was consulted to evaluate the  sternal wound.  Dr. Iran Planas saw the patient and felt that given the location of the defect, it would require left rectus abdominus flap for closure after his renal function was optimized.  Renal function stablized and he was cleared to proceed with surgery. All risks, benefits and alternatives of surgery were explained in detail, and the patient agreed to proceed.   He was taken to the operating room and underwent rectus muscle flap as above.  Postoperatively, he was seen in consultation by the Infectious Disease service due to his wound culture growing Bacteroides. Antibiotic coverage was changed to Invanz via PICC line, and it was recommended to  continue for a total of 2 weeks after surgery (to be discontinued on 3/16).   Overall, he has progressed well postoperatively.  All drains have been removed and his wound is healing well without signs of infection. Renal function has continued to improve, with creatinine peaking at 2.47 and trending down to 1.34 at present.  He did have a small left upper lobe opacity on chest x-ray, but has had no fever or leukocytosis.  This has been treated with aggressive pulmonary toilet measures and is improving.  He has been weak and deconditioned, but is working on mobility and ambulation with physical therapy.  He has remained afebrile, and cardiac status has been stable.  He is tolerating a regular diet. We anticipate discharge home in the next 24-48 hours provided no acute changes occur.    Recent vital signs:  Filed Vitals:   05/31/13 0515  BP: 120/71  Pulse: 84  Temp: 97.6 F (36.4 C)  Resp: 20    Recent laboratory studies:  CBC:  Recent Labs  05/29/13 0500 05/31/13 0500  WBC 9.4 9.0  HGB 8.2* 8.2*  HCT 25.3* 25.4*  PLT 413* 456*   BMET:   Recent Labs  05/29/13 0500 05/31/13 0500  NA 143 144  K 3.3* 3.1*  CL 104 104  CO2 26 28  GLUCOSE 85 82  BUN 12 12  CREATININE 1.34 1.15  CALCIUM 8.2* 8.0*    PT/INR: No results found for this basename: LABPROT, INR,  in the last 72 hours   Discharge Medications:     Medication List    STOP taking these medications       HYDROcodone-acetaminophen 10-325 MG per tablet  Commonly known as:  NORCO     traMADol 50 MG tablet  Commonly known as:  ULTRAM      TAKE these medications       ascorbic acid 1000 MG tablet  Commonly known as:  VITAMIN C  Take 1 tablet (1,000 mg total) by mouth daily.     aspirin 325 MG EC tablet  Take 325 mg by mouth daily as needed (angina).     atorvastatin 20 MG tablet  Commonly known as:  LIPITOR  Take 20 mg by mouth daily.     diazepam 10 MG tablet  Commonly known as:  VALIUM  Take 10 mg  by mouth 2 (two) times daily as needed for anxiety (for back spasms).     feeding supplement (RESOURCE BREEZE) Liqd  Take 1 Container by mouth 3 (three) times daily between meals.     furosemide 40 MG tablet  Commonly known as:  LASIX  Take 1 tablet (40 mg total) by mouth daily.     guaiFENesin 600 MG 12 hr tablet  Commonly known as:  MUCINEX  Take 600 mg by mouth 2 (two) times daily as needed  for cough or to loosen phlegm.     ibuprofen 200 MG tablet  Commonly known as:  ADVIL,MOTRIN  Take 200 mg by mouth daily as needed for mild pain.     montelukast 10 MG tablet  Commonly known as:  SINGULAIR  Take 10 mg by mouth daily.     oxyCODONE 5 MG immediate release tablet  Commonly known as:  Oxy IR/ROXICODONE  Take 1 tablet (5 mg total) by mouth every 4 (four) hours as needed for severe pain.     potassium chloride SA 20 MEQ tablet  Commonly known as:  K-DUR,KLOR-CON  Take 2 tablets (40 mEq total) by mouth daily.     PROAIR HFA 108 (90 BASE) MCG/ACT inhaler  Generic drug:  albuterol  Inhale 1-2 puffs into the lungs every 6 (six) hours as needed for wheezing or shortness of breath.     sodium chloride 0.9 % SOLN 50 mL with ertapenem 1 G SOLR 1 g  Inject 1 g into the vein daily. Last dose is to be given on 06/05/2013.     vitamin B-12 1000 MCG tablet  Commonly known as:  CYANOCOBALAMIN  Take 1,000 mcg by mouth daily.     vitamin E 400 UNIT capsule  Take 400 Units by mouth daily.     zolpidem 10 MG tablet  Commonly known as:  AMBIEN  Take 10 mg by mouth at bedtime as needed for sleep.        Discharge Instructions:  The patient is to refrain from driving, heavy lifting or strenuous activity.  May shower daily and clean incisions with soap and water.  May resume regular diet.   Follow Up: Follow-up Information   Follow up with [facility], Arnoldo Hooker, MD. (As directed)    Specialty:  Plastic Surgery   Contact information:   [address]  [address] [phone]       Follow up with Melrose Nakayama, MD. (PA/LAT CXR (to be taken at [place] which is in the same building as Dr. Leonarda Salon office) one hour prior to office appointment with Dr. Theotis Burrow will mail appointment date and time)    Specialty:  Cardiothoracic Surgery   Contact information:   [address] [place] [address] [phone]       Follow up with [facility]. (Registered Nurse for IV ABX and Physical Therapy for evaluation and treatment. Please remove PICC line after last dose of IV antibiotic on 06/05/2013.)    Contact information:   [place] [address] [phone]       Follow up with [facility], Arnoldo Hooker, MD. Schedule an appointment as soon as possible for a visit in 1 week.   Specialty:  Plastic Surgery   Contact information:   [address] [place] [address] [phone]        Nani Skillern PA-C 05/31/2013, 8:44 AM

## 2013-05-30 NOTE — Progress Notes (Signed)
Pt report called to [id number]. VSS. Pt requested something for anxiety related to this transfer. Provided ordered Valium. Will continue to monitor. Belongs packed and sent to new room.

## 2013-05-30 NOTE — Progress Notes (Signed)
Per Dr's orders staples removed. Pt tolerated well. No signs of infection or drainage at the site.

## 2013-05-30 NOTE — Progress Notes (Addendum)
       [address]       [place] [address]             [phone]          7 Days Post-Op Procedure(s) (LRB): LEFT RECTUS ABDOMINIS MUSCLE FLAP TO CHEST (Left)  Subjective: Comfortable, no complaints.   Objective: Vital signs in last 24 hours: Patient Vitals for the past 24 hrs:  BP Temp Temp src Pulse Resp SpO2  05/30/13 0707 98/59 mmHg 98.3 F (36.8 C) Oral 72 21 100 %  05/30/13 0447 96/52 mmHg 98.5 F (36.9 C) Oral 70 17 96 %  05/30/13 0054 98/55 mmHg 98.6 F (37 C) Oral 71 16 99 %  05/29/13 2230 - - - 76 19 100 %  05/29/13 2100 - - - 83 12 100 %  05/29/13 2026 100/63 mmHg 97.8 F (36.6 C) Oral 85 13 100 %  05/29/13 1137 106/65 mmHg 97.5 F (36.4 C) Oral - - -   Current Weight  05/27/13 161 lb 9.6 oz (73.3 kg)     Intake/Output from previous day: 03/09 0701 - 03/10 0700 In: 1080 [P.O.:1080] Out: 1200 [Urine:1200]    PHYSICAL EXAM:  Heart: RRR Lungs: Clear Wound: Clean and dry Extremities: Mild LE edema    Lab Results: CBC: Recent Labs  05/29/13 0500  WBC 9.4  HGB 8.2*  HCT 25.3*  PLT 413*   BMET:  Recent Labs  05/29/13 0500  NA 143  K 3.3*  CL 104  CO2 26  GLUCOSE 85  BUN 12  CREATININE 1.34  CALCIUM 8.2*    PT/INR: No results found for this basename: LABPROT, INR,  in the last 72 hours    Assessment/Plan: S/P Procedure(s) (LRB): LEFT RECTUS ABDOMINIS MUSCLE FLAP TO CHEST (Left) Rectus flap  POD#7 - Continue care per plastics. Continue PT/CRPI. IDColbert Ewing for Bacteroides sternal wound infection to complete course on 3/16 per ID. Hopefully getting closer to discharge home.   LOS: 16 days    COLLINS,GINA H 05/30/2013  Patient seen and examined, agree with above Transfer to 2000 Possibly home tomorrow with [facility] and [place]

## 2013-05-30 NOTE — Progress Notes (Signed)
NUTRITION FOLLOW UP  DOCUMENTATION CODES Per approved criteria  -Severe malnutrition in the context of chronic illness   Intervention:   Continue Resource Breeze po TID, each supplement provides 250 kcal and 9 grams of protein. Reviewed and encouraged high protein foods. RD to follow for nutrition care plan.  Nutrition Dx: Increased nutrient needs related to wound healing as evidenced by estimated nutrition needs, ongoing  Goal:   Patient to meet >/= 90% of estimated nutrition needs, progressing  Monitor:   PO & supplemental intake, weight, labs, I/O's  Assessment:   73 year old male who is status post off-pump coronary artery bypass graft for single-vessel disease, intraoperative trans-esophageal echocardiogram, and a right upper lobectomy performed by Dr. Princess Perna over a month ago who presents to ED for postop complication.  Sternal wound debridement and VAC placement on 2/24. Sternal wound debridement and wound VAC change on 2/25. L rectus abdominis muscle flap to chest on 3/3.  Per team notes, pt with evidence of wound healing. Team in anticipating d/c soon, in 1-2 days.  Currently ordered for a Regular diet. Meal intake is roughly 50%. Pt states that he cannot eat a lot at baseline, typically eats 5-6 small meals a day. Eating the protein off of his trays. Had a hot dog for lunch with tomato soup. Pt continues to receive Resource Breeze supplements between meals. He prefers these to Ensure and would like to continue to receive them.  Potassium is low at 3.3. Meds of note include: lasix, protonix, potassium chloride, vitamins B12, C and E  Height: Ht Readings from Last 1 Encounters:  05/14/13 6' (1.829 m)    Weight Status:   Wt Readings from Last 1 Encounters:  05/27/13 161 lb 9.6 oz (73.3 kg)  Admit wt 142 lb  Re-estimated needs:  Kcal: 2000-2200  Protein: 120-130 g  Fluid: 2.0-2.2 L  Skin:  Chest incision Abdominal incision  Diet Order:  General   Intake/Output Summary (Last 24 hours) at 05/30/13 1351 Last data filed at 05/30/13 0817  Gross per 24 hour  Intake    480 ml  Output    950 ml  Net   -470 ml    Last BM: 3/6  Labs:   Recent Labs Lab 05/25/13 0330 05/27/13 0500 05/29/13 0500  NA 139 143 143  K 3.5* 3.8 3.3*  CL 103 104 104  CO2 24 25 26   BUN 16 13 12   CREATININE 1.68* 1.47* 1.34  CALCIUM 8.1* 8.7 8.2*  GLUCOSE 101* 99 85    Scheduled Meds: . atorvastatin  20 mg Oral Daily  . ertapenem  1 g Intravenous Q24H  . feeding supplement (RESOURCE BREEZE)  1 Container Oral TID BM  . furosemide  40 mg Oral Daily  . montelukast  10 mg Oral Daily  . pantoprazole  40 mg Oral Q1200  . potassium chloride  20 mEq Oral BID  . sodium chloride  10-40 mL Intracatheter Q12H  . vitamin B-12  1,000 mcg Oral Daily  . vitamin C  1,000 mg Oral Daily  . vitamin E  400 Units Oral Daily    Continuous Infusions: . sodium chloride 20 mL/hr at 05/27/13 0400    Inda Coke MS, RD, LDN Inpatient Registered Dietitian Pager: [phone] After-hours pager: [phone]

## 2013-05-31 LAB — BASIC METABOLIC PANEL
BUN: 12 mg/dL (ref 6–23)
CHLORIDE: 104 meq/L (ref 96–112)
CO2: 28 mEq/L (ref 19–32)
Calcium: 8 mg/dL — ABNORMAL LOW (ref 8.4–10.5)
Creatinine, Ser: 1.15 mg/dL (ref 0.50–1.35)
GFR calc non Af Amer: 62 mL/min — ABNORMAL LOW (ref >90)
GFR, EST AFRICAN AMERICAN: 72 mL/min — AB (ref >90)
Glucose, Bld: 82 mg/dL (ref 70–99)
Potassium: 3.1 mEq/L — ABNORMAL LOW (ref 3.7–5.3)
SODIUM: 144 meq/L (ref 137–147)

## 2013-05-31 LAB — CBC
HCT: 25.4 % — ABNORMAL LOW (ref 39.0–52.0)
Hemoglobin: 8.2 g/dL — ABNORMAL LOW (ref 13.0–17.0)
MCH: 27.6 pg (ref 26.0–34.0)
MCHC: 32.3 g/dL (ref 30.0–36.0)
MCV: 85.5 fL (ref 78.0–100.0)
Platelets: 456 10*3/uL — ABNORMAL HIGH (ref 150–400)
RBC: 2.97 MIL/uL — AB (ref 4.22–5.81)
RDW: 16 % — ABNORMAL HIGH (ref 11.5–15.5)
WBC: 9 10*3/uL (ref 4.0–10.5)

## 2013-05-31 MED ORDER — FUROSEMIDE 40 MG PO TABS: 40.0000 mg | ORAL_TABLET | Freq: Every day | ORAL | Status: DC

## 2013-05-31 MED ORDER — SODIUM CHLORIDE 0.9 % IV SOLN: 1.0000 g | INTRAVENOUS | Status: AC

## 2013-05-31 MED ORDER — POTASSIUM CHLORIDE CRYS ER 20 MEQ PO TBCR
40.0000 meq | EXTENDED_RELEASE_TABLET | Freq: Once | ORAL | Status: AC
  Administered 2013-05-31: 40 meq via ORAL

## 2013-05-31 MED ORDER — POTASSIUM CHLORIDE CRYS ER 20 MEQ PO TBCR: 40.0000 meq | EXTENDED_RELEASE_TABLET | Freq: Every day | ORAL | Status: DC

## 2013-05-31 MED ORDER — BOOST / RESOURCE BREEZE PO LIQD: 1.0000 | Freq: Three times a day (TID) | ORAL | Status: DC

## 2013-05-31 MED ORDER — OXYCODONE HCL 10 MG PO TABS: 10.0000 mg | ORAL_TABLET | ORAL | Status: DC | PRN

## 2013-05-31 MED ORDER — ASCORBIC ACID 1000 MG PO TABS: 1000.0000 mg | ORAL_TABLET | Freq: Every day | ORAL | Status: DC

## 2013-05-31 MED ORDER — OXYCODONE HCL 5 MG PO TABS
5.0000 mg | ORAL_TABLET | ORAL | Status: DC | PRN
  Administered 2013-05-31: 5 mg via ORAL
  Filled 2013-05-31: qty 1

## 2013-05-31 MED ORDER — HEPARIN SOD (PORK) LOCK FLUSH 100 UNIT/ML IV SOLN
250.0000 [IU] | Freq: Every day | INTRAVENOUS | Status: DC
  Filled 2013-05-31: qty 3

## 2013-05-31 MED ORDER — OXYCODONE HCL 5 MG PO TABS: 5.0000 mg | ORAL_TABLET | ORAL | Status: DC | PRN

## 2013-05-31 MED ORDER — HEPARIN SOD (PORK) LOCK FLUSH 100 UNIT/ML IV SOLN
250.0000 [IU] | INTRAVENOUS | Status: DC | PRN
  Administered 2013-05-31: 250 [IU]
  Filled 2013-05-31: qty 3

## 2013-05-31 NOTE — Progress Notes (Signed)
Physical Therapy Treatment and Discharge Patient Details Name: Troy [name] MRN: [id number] DOB: 1940-12-02 Today's Date: 05/31/2013 Time: 2458-0998 PT Time Calculation (min): 29 min  PT Assessment / Plan / Recommendation  History of Present Illness 73 yo male who underwent CABG x 1 and right upper lobectomy via a median sternotomy on 04/20/13. He has a history of CAD, COPD, lung cancer(T1b, N0- stage IA), HTN, hyperlipidemia, and malnutrition. He had a prolonged air leak after surgery and was finally discharged after that resolved on POD # 15. He also was treated empirically with levaquin for a possible UTI. He has been having fevers for the last 5 days or so. The highest was 101.2, most of the time it only went to about 100. Redness and swelling and what appeared to be an abscess on the lower part of his sternal wound that drained in ED. Pt with sternal wound s/p Left rectus abdominus flap to sternum 3/3   PT Comments   All acute PT goals met. Continue to recommend HHPT for safety eval and to assist pt with progressing independence for eventual return home alone.    Follow Up Recommendations  Home health PT;Supervision for mobility/OOB     Does the patient have the potential to tolerate intense rehabilitation     Barriers to Discharge        Equipment Recommendations  None recommended by PT    Recommendations for Other Services    Frequency Min 3X/week   Progress towards PT Goals Progress towards PT goals: Goals met/education completed, patient discharged from PT  Plan Current plan remains appropriate    Precautions / Restrictions Precautions Precautions: Sternal;Fall Precaution Comments: able to state and adhere to sternal precautions   Pertinent Vitals/Pain Chronic Rt knee pain "OK today" although continues with antalgic gait     Mobility  Bed Mobility General bed mobility comments: able to verbalize correct technique to protect sternum for supine to sit and sit to supine; pt  in recliner on arrival and denied return to bed Transfers Overall transfer level: Modified independent Equipment used: Rolling walker (2 wheeled) Transfers: Sit to/from Stand Sit to Stand: Modified independent (Device/Increase time) Ambulation/Gait Ambulation/Gait assistance: Modified independent (Device/Increase time) Ambulation Distance (Feet): 700 Feet Assistive device: Rolling walker (2 wheeled) Gait Pattern/deviations: Step-to pattern Gait velocity: decr due to knee pain General Gait Details: pt continues with chronic Rt knee pain and independently recalled proper gait pattern and use of RW for support    Exercises     PT Diagnosis:    PT Problem List:   PT Treatment Interventions:     PT Goals (current goals can now be found in the care plan section) Acute Rehab PT Goals Patient Stated Goal: get back to caring for myself  Visit Information  Last PT Received On: 05/31/13 Assistance Needed: +1 History of Present Illness: 73 yo male who underwent CABG x 1 and right upper lobectomy via a median sternotomy on 04/20/13. He has a history of CAD, COPD, lung cancer(T1b, N0- stage IA), HTN, hyperlipidemia, and malnutrition. He had a prolonged air leak after surgery and was finally discharged after that resolved on POD # 15. He also was treated empirically with levaquin for a possible UTI. He has been having fevers for the last 5 days or so. The highest was 101.2, most of the time it only went to about 100. Redness and swelling and what appeared to be an abscess on the lower part of his sternal wound that drained in ED.  Pt with sternal wound s/p Left rectus abdominus flap to sternum 3/3    Subjective Data  Patient Stated Goal: get back to caring for myself   Cognition  Cognition Arousal/Alertness: Awake/alert Behavior During Therapy: WFL for tasks assessed/performed Overall Cognitive Status: Within Functional Limits for tasks assessed    Balance  General Comments General comments  (skin integrity, edema, etc.): excellent safety awareness; able to recall LE exercises for circulation/edema   End of Session PT - End of Session Activity Tolerance: Patient tolerated treatment well Patient left: in chair;with call bell/phone within reach Nurse Communication: Mobility status   Patient is being discharged from PT services secondary to:  Goals met and no further acute therapy needs identified.    Please see above Therapy Progress Note for current level of functioning and progress toward goals.  Progress and discharge plan and discussed with patient/caregiver and they  Agree        Troy [name] 05/31/2013, 9:34 AM Pager [phone]

## 2013-05-31 NOTE — Progress Notes (Signed)
Pt currently on invanz but insurance will not cover as outpt.  Plan to change to Primaxin.  CrCl ~59 ml/min.  Rec dose of 500 mg IV q8 hours.  Thanks, Excell Seltzer, PharmD

## 2013-05-31 NOTE — Progress Notes (Signed)
POD # 8 left rectus flap to chest  Transferred to 2W no other events  Temp:  [97.6 F (36.4 C)-98.9 F (37.2 C)] 97.6 F (36.4 C) (03/11 0515) Pulse Rate:  [84] 84 (03/11 0515) Resp:  [20] 20 (03/11 0515) BP: (96-120)/(64-72) 120/71 mmHg (03/11 0515) SpO2:  [95 %-100 %] 95 % (03/11 0515) Weight:  [70.5 kg (155 lb 6.8 oz)] 70.5 kg (155 lb 6.8 oz) (03/11 0515)   PE  incisions intact,  Abdomen soft  No drainage  A/P: Contiunes with PT. Has [place] arranged.  Can f/u with me 1 week post d/c in office.   Irene Limbo, MD [facility] [phone]

## 2013-05-31 NOTE — Progress Notes (Addendum)
      [address]       ,[place] [address]             [phone]        8 Days Post-Op Procedure(s) (LRB): LEFT RECTUS ABDOMINIS MUSCLE FLAP TO CHEST (Left)  Subjective: Patient sitting in chair without complaints.  Objective: Vital signs in last 24 hours: Temp:  [97.6 F (36.4 C)-98.9 F (37.2 C)] 97.6 F (36.4 C) (03/11 0515) Pulse Rate:  [84] 84 (03/11 0515) Cardiac Rhythm:  [-] Normal sinus rhythm (03/10 2000) Resp:  [20] 20 (03/11 0515) BP: (96-120)/(64-72) 120/71 mmHg (03/11 0515) SpO2:  [95 %-100 %] 95 % (03/11 0515) Weight:  [70.5 kg (155 lb 6.8 oz)] 70.5 kg (155 lb 6.8 oz) (03/11 0515)   Current Weight  05/31/13 70.5 kg (155 lb 6.8 oz)      Intake/Output from previous day: 03/10 0701 - 03/11 0700 In: 240 [P.O.:240] Out: 925 [Urine:925]   Physical Exam:  Cardiovascular: RRR Pulmonary: Clear to auscultation bilaterally; no rales, wheezes, or rhonchi. Abdomen: Soft, non tender, bowel sounds present. Extremities: Bilateral lower extremity edema. Wounds: Clean and dry.  No erythema or signs of infection.  Lab Results: CBC: Recent Labs  05/29/13 0500 05/31/13 0500  WBC 9.4 9.0  HGB 8.2* 8.2*  HCT 25.3* 25.4*  PLT 413* 456*   BMET:  Recent Labs  05/29/13 0500 05/31/13 0500  NA 143 144  K 3.3* 3.1*  CL 104 104  CO2 26 28  GLUCOSE 85 82  BUN 12 12  CREATININE 1.34 1.15  CALCIUM 8.2* 8.0*    PT/INR:  Lab Results  Component Value Date   INR 1.29 05/14/2013   INR 1.16 04/20/2013   INR 0.93 04/18/2013   ABG:  INR: Will add last result for INR, ABG once components are confirmed Will add last 4 CBG results once components are confirmed  Assessment/Plan:  1. CV - SR 2.  ID-on Invanz for Bacteroides sternal wound infection (to be done on 3/16) 3.Anemia-H and H stable at 8.2 and 25.4 4.Supplement potassium 5. Possible discharge with [place] and HHPT. Will arrange a 2 week follow up with Dr.  Burman Blacksmith MPA-C 05/31/2013,8:09 AM  Patient seen and examined, agree with above Home with [facility], PT later today once arrangements complete

## 2013-06-01 ENCOUNTER — Other Ambulatory Visit: Payer: Self-pay | Admitting: *Deleted

## 2013-06-01 DIAGNOSIS — I2510 Atherosclerotic heart disease of native coronary artery without angina pectoris: Secondary | ICD-10-CM

## 2013-06-07 ENCOUNTER — Inpatient Hospital Stay: Payer: Medicare Other | Admitting: Internal Medicine

## 2013-06-13 ENCOUNTER — Encounter: Payer: Self-pay | Admitting: Thoracic Surgery (Cardiothoracic Vascular Surgery)

## 2013-06-13 ENCOUNTER — Ambulatory Visit
Admission: RE | Admit: 2013-06-13 | Discharge: 2013-06-13 | Disposition: A | Discharge status: Home / Self Care | Payer: Medicare Other | Source: Ambulatory Visit | Attending: Thoracic Surgery (Cardiothoracic Vascular Surgery) | Admitting: Thoracic Surgery (Cardiothoracic Vascular Surgery)

## 2013-06-13 ENCOUNTER — Ambulatory Visit (INDEPENDENT_AMBULATORY_CARE_PROVIDER_SITE_OTHER): Payer: Self-pay | Admitting: Thoracic Surgery (Cardiothoracic Vascular Surgery)

## 2013-06-13 VITALS — BP 131/82 | HR 94 | Resp 16 | Ht 72.0 in | Wt 144.5 lb

## 2013-06-13 DIAGNOSIS — Z9889 Other specified postprocedural states: Secondary | ICD-10-CM

## 2013-06-13 DIAGNOSIS — C3410 Malignant neoplasm of upper lobe, unspecified bronchus or lung: Secondary | ICD-10-CM

## 2013-06-13 DIAGNOSIS — I2510 Atherosclerotic heart disease of native coronary artery without angina pectoris: Secondary | ICD-10-CM

## 2013-06-13 DIAGNOSIS — Z951 Presence of aortocoronary bypass graft: Secondary | ICD-10-CM

## 2013-06-13 DIAGNOSIS — T8149XA Infection following a procedure, other surgical site, initial encounter: Secondary | ICD-10-CM

## 2013-06-13 DIAGNOSIS — T8140XA Infection following a procedure, unspecified, initial encounter: Secondary | ICD-10-CM

## 2013-06-13 DIAGNOSIS — Z902 Acquired absence of lung [part of]: Secondary | ICD-10-CM

## 2013-06-13 NOTE — Progress Notes (Signed)
HPI:  [name] returns today for a scheduled followup visit.  [name] is a 73 year old gentleman who underwent a combined off-pump CABG x1 with a right mammary to the right coronary and a right upper lobectomy on January 29. He had a prolonged air leak postoperatively. This finally resolved and his chest tube was removed. He then presented back with a sternal wound infection in late February. This required debridement and eventually Dr. Iran [name] performed a rectus flap to close the wound. He finished one-month course of Invanz last week.  Since completing his antibiotics he has not had any fevers, chills, or sweats. He does have some incisional pain. He has not noticed any redness or drainage from his incision. He has not had any anginal-type chest pains. He says that his breathing is good with the exception of the sensation that he cannot get a really deep breath in. He says that his appetite is very good and he is eating about 4 times a day.  Despite that he really has not gained a significant amount of weight.  Past Medical History  Diagnosis Date  . CAD (coronary artery disease)   . HTN (hypertension)   . COPD (chronic obstructive pulmonary disease)   . Hyperlipidemia   . Depression   . Vitamin D deficiency   . Sleep apnea   . Anginal pain   . Shortness of breath   . Arthritis   . Polycystic kidney disease       Current Outpatient Prescriptions  Medication Sig Dispense Refill  . aspirin 325 MG EC tablet Take 325 mg by mouth daily as needed (angina).      Marland Kitchen atorvastatin (LIPITOR) 20 MG tablet Take 20 mg by mouth daily.      . diazepam (VALIUM) 10 MG tablet Take 10 mg by mouth 2 (two) times daily as needed for anxiety (for back spasms).      . furosemide (LASIX) 40 MG tablet Take 1 tablet (40 mg total) by mouth daily.  30 tablet  1  . guaiFENesin (MUCINEX) 600 MG 12 hr tablet Take 600 mg by mouth 2 (two) times daily as needed for cough or to loosen phlegm.      Marland Kitchen  HYDROcodone-acetaminophen (NORCO) 10-325 MG per tablet Take 1 tablet by mouth every 6 (six) hours as needed.      Marland Kitchen ibuprofen (ADVIL,MOTRIN) 200 MG tablet Take 200 mg by mouth daily as needed for mild pain.       . montelukast (SINGULAIR) 10 MG tablet Take 10 mg by mouth daily.       . potassium chloride SA (K-DUR,KLOR-CON) 20 MEQ tablet Take 2 tablets (40 mEq total) by mouth daily.  60 tablet  1  . vitamin B-12 (CYANOCOBALAMIN) 1000 MCG tablet Take 1,000 mcg by mouth daily.      . vitamin E 400 UNIT capsule Take 400 Units by mouth daily.      Marland Kitchen zolpidem (AMBIEN) 10 MG tablet Take 10 mg by mouth at bedtime as needed for sleep.       . carvedilol (COREG) 6.25 MG tablet       . traMADol (ULTRAM) 50 MG tablet        No current facility-administered medications for this visit.   Facility-Administered Medications Ordered in Other Visits  Medication Dose Route Frequency Provider Last Rate Last Dose  . chlorhexidine (HIBICLENS) 4 % liquid 2 application  30 mL Topical UD Troy Nakayama, MD      . metoprolol  tartrate (LOPRESSOR) tablet 12.5 mg  12.5 mg Oral Once Troy Nakayama, MD        Physical Exam BP 131/82  Pulse 94  Resp 16  Ht 6' (1.829 m)  Wt 144 lb 8 oz (65.545 kg)  BMI 19.59 kg/m2  SpO71 80%  73 year old man in no acute distress Thin with cachexia Alert and oriented x3, no focal deficits Lungs clear with equal breath sound bilaterally Cardiac regular rate and rhythm normal S1 and S2 Wound healing well No peripheral edema  Diagnostic Tests: CHEST 2 VIEW  COMPARISON: Portable chest x-ray of 05/29/2013, and CT angio chest  04/28/2013  FINDINGS:  There is minimal haziness in the left suprahilar region not seen  previously. A small patchy focus of pneumonia cannot be excluded and  followup is recommended. No definite effusion is seen. Cardiomegaly  is stable. Median sternotomy sutures are intact. Opacity in the  retrosternal region most likely is postoperative with  scarring and  volume loss present attention to this area on followup chest x-ray  is recommended.  IMPRESSION:  1. Vague opacity in the left suprahilar region. A small focus of  pneumonia cannot be excluded.  2. Opacity retrosternal on the lateral view most likely  postoperative. Recommend attention to this area on followup chest  x-ray.  Electronically Signed  By: Troy [name] M.D.  On: 06/13/2013 11:46   Impression:  73 year old gentleman who had a combined off-pump cornea bypass grafting and right upper lobectomy on January 29. This was accommodated by prolonged air leak and then a sternal wound infection. He required a rectus flap to close the bottom of the sternal wound. The upper portion of the sternal wound healed well and was not involved with the process. He has now completed a one-month course of antibiotics following the flap procedure. He has been off of antibiotics for a week without any fevers or chills. He's not having any problems with his wounds.  I encouraged him to continue to eat as much as he can. He should start to see some muscle mass return over the next couple of months.  He still is having a lot of pain in his right knee. He's been told he needed cortisol injection, but they wanted to wait until after he had healed from his surgeries before doing that. I think he would be fine to do that at this point, but only really conservatively and wait a couple of more weeks, I think that would be reasonable.  The opacity in his left suprahilar region actually is improved over his previous chest x-rays.   Plan:  I will plan to see him back in one month to check on his progress. We will plan to see him every 3 months for the first year for followup regarding his lung cancer. We will do a plain chest x-ray when he comes back next month and then a CT scan at six-month.

## 2013-07-17 ENCOUNTER — Other Ambulatory Visit: Payer: Self-pay | Admitting: Thoracic Surgery (Cardiothoracic Vascular Surgery)

## 2013-07-17 DIAGNOSIS — I10 Essential (primary) hypertension: Secondary | ICD-10-CM

## 2013-07-18 ENCOUNTER — Ambulatory Visit
Admission: RE | Admit: 2013-07-18 | Discharge: 2013-07-18 | Disposition: A | Discharge status: Home / Self Care | Payer: Medicare Other | Source: Ambulatory Visit | Attending: Thoracic Surgery (Cardiothoracic Vascular Surgery) | Admitting: Thoracic Surgery (Cardiothoracic Vascular Surgery)

## 2013-07-18 ENCOUNTER — Encounter: Payer: Self-pay | Admitting: Thoracic Surgery (Cardiothoracic Vascular Surgery)

## 2013-07-18 ENCOUNTER — Ambulatory Visit (INDEPENDENT_AMBULATORY_CARE_PROVIDER_SITE_OTHER): Payer: Self-pay | Admitting: Thoracic Surgery (Cardiothoracic Vascular Surgery)

## 2013-07-18 VITALS — BP 134/83 | HR 78 | Resp 16 | Ht 72.0 in | Wt 144.0 lb

## 2013-07-18 DIAGNOSIS — Z9889 Other specified postprocedural states: Secondary | ICD-10-CM

## 2013-07-18 DIAGNOSIS — Z951 Presence of aortocoronary bypass graft: Secondary | ICD-10-CM

## 2013-07-18 DIAGNOSIS — T8149XA Infection following a procedure, other surgical site, initial encounter: Secondary | ICD-10-CM

## 2013-07-18 DIAGNOSIS — I10 Essential (primary) hypertension: Secondary | ICD-10-CM

## 2013-07-18 DIAGNOSIS — Z902 Acquired absence of lung [part of]: Secondary | ICD-10-CM

## 2013-07-18 DIAGNOSIS — T8140XA Infection following a procedure, unspecified, initial encounter: Secondary | ICD-10-CM

## 2013-07-18 NOTE — Progress Notes (Signed)
HPI:  [name] returns for a scheduled postoperative followup visit.  He is a 73 year old gentleman who underwent a combined off-pump CABG x1 with a right mammary to the right coronary and a right upper lobectomy on January 29. He had a prolonged air leak postoperatively. This finally resolved and his chest tube was removed. He then presented back with a sternal wound infection in late February. This likely was a right-sided empyema due to the prolonged chest drainage but then drain through the sternal wound. It required debridement and eventually Dr. Iran Planas performed a rectus flap to close the wound on March 3. He finished one-month course of Invanz in late March.  He was last seen in the office on March 24. He was feeling better, but was still having difficulty with eating and gaining weight.  He says that since his last visit he has felt better. As he has gotten more active he has some soreness of his lower rib cage bilaterally. He has not had any angina. He is starting to gain weight and his appetite is improved.  Past Medical History  Diagnosis Date  . CAD (coronary artery disease)   . HTN (hypertension)   . COPD (chronic obstructive pulmonary disease)   . Hyperlipidemia   . Depression   . Vitamin D deficiency   . Sleep apnea   . Anginal pain   . Shortness of breath   . Arthritis   . Polycystic kidney disease        Current Outpatient Prescriptions  Medication Sig Dispense Refill  . aspirin 325 MG EC tablet Take 325 mg by mouth daily as needed (angina).      Marland Kitchen atorvastatin (LIPITOR) 20 MG tablet Take 20 mg by mouth daily.      . diazepam (VALIUM) 10 MG tablet Take 10 mg by mouth 2 (two) times daily as needed for anxiety (for back spasms).      . furosemide (LASIX) 40 MG tablet Take 1 tablet (40 mg total) by mouth daily.  30 tablet  1  . guaiFENesin (MUCINEX) 600 MG 12 hr tablet Take 600 mg by mouth 2 (two) times daily as needed for cough or to loosen phlegm.      Marland Kitchen  HYDROcodone-acetaminophen (NORCO) 10-325 MG per tablet Take 1 tablet by mouth every 6 (six) hours as needed.      Marland Kitchen ibuprofen (ADVIL,MOTRIN) 200 MG tablet Take 200 mg by mouth daily as needed for mild pain.       . montelukast (SINGULAIR) 10 MG tablet Take 10 mg by mouth daily.       . potassium chloride SA (K-DUR,KLOR-CON) 20 MEQ tablet Take 2 tablets (40 mEq total) by mouth daily.  60 tablet  1  . traMADol (ULTRAM) 50 MG tablet       . vitamin B-12 (CYANOCOBALAMIN) 1000 MCG tablet Take 1,000 mcg by mouth daily.      Marland Kitchen zolpidem (AMBIEN) 10 MG tablet Take 10 mg by mouth at bedtime as needed for sleep.        No current facility-administered medications for this visit.   Facility-Administered Medications Ordered in Other Visits  Medication Dose Route Frequency Provider Last Rate Last Dose  . chlorhexidine (HIBICLENS) 4 % liquid 2 application  30 mL Topical UD Melrose Nakayama, MD      . metoprolol tartrate (LOPRESSOR) tablet 12.5 mg  12.5 mg Oral Once Melrose Nakayama, MD        Physical Exam BP 134/83  Pulse  78  Resp 16  Ht 6' (1.829 m)  Wt 144 lb (65.318 kg)  BMI 19.53 kg/m2  SpO2 98% Thin 73 yo male in NAD Alert and oriented x3 with no focal deficits No cervical or supraclavicular adenopathy Sternal incision and an abdominal incisions well healed Lungs clear bilaterally  Diagnostic Tests: Chest x-ray 07/18/2013 CHEST 2 VIEW  COMPARISON: Chest x-ray of 06/13/2013  FINDINGS:  On the lateral view though or opacities in the retrosternal airspace  which may be related to the sternal wound and infection recently. No  focal infiltrate or effusion is seen. The heart is mildly enlarged  and stable. The right chest tube is been removed and no pneumothorax  is seen. Median sternotomy sutures are noted. A partially compressed  mid thoracic vertebral body is stable compared to the prior CT the  chest.  IMPRESSION:  Vague opacities in the retrosternal airspace may be related to  prior  sternal wound infection and debridement. No focal infiltrate or  effusion is seen.  Electronically Signed  By: Ivar Drape M.D.  On: 07/18/2013 11:10  Impression: 73 year old gentleman who is now about 3 months post off-pump coronary bypass grafting x1 and right upper lobectomy for stage I a (T1 B., N0) squamous cell carcinoma. His postoperative course was complicated by a sternal wound infection which required debridement and ultimately a rectus flap for closure. The infection and additional operations slowed down his progress dramatically, but he seems to be making very good progress at this point.  He will need to continue to be followed for his lung cancer  I will plan to see him back in 3 months for 6 month followup visit. We will do a CT of the chest at that time  Plan:  Returns 3 months with CT of chest

## 2013-09-07 ENCOUNTER — Other Ambulatory Visit: Payer: Self-pay

## 2013-09-07 DIAGNOSIS — C3490 Malignant neoplasm of unspecified part of unspecified bronchus or lung: Secondary | ICD-10-CM

## 2013-10-10 ENCOUNTER — Ambulatory Visit
Admission: RE | Admit: 2013-10-10 | Discharge: 2013-10-10 | Disposition: A | Discharge status: Home / Self Care | Payer: Medicare Other | Source: Ambulatory Visit | Attending: Thoracic Surgery (Cardiothoracic Vascular Surgery) | Admitting: Thoracic Surgery (Cardiothoracic Vascular Surgery)

## 2013-10-10 ENCOUNTER — Ambulatory Visit (INDEPENDENT_AMBULATORY_CARE_PROVIDER_SITE_OTHER): Payer: Medicare Other | Admitting: Thoracic Surgery (Cardiothoracic Vascular Surgery)

## 2013-10-10 ENCOUNTER — Encounter: Payer: Self-pay | Admitting: Thoracic Surgery (Cardiothoracic Vascular Surgery)

## 2013-10-10 VITALS — BP 140/90 | HR 84 | Resp 20 | Ht 72.0 in | Wt 178.0 lb

## 2013-10-10 DIAGNOSIS — C3490 Malignant neoplasm of unspecified part of unspecified bronchus or lung: Secondary | ICD-10-CM

## 2013-10-10 DIAGNOSIS — C3410 Malignant neoplasm of upper lobe, unspecified bronchus or lung: Secondary | ICD-10-CM

## 2013-10-10 NOTE — Progress Notes (Signed)
HPI:  [name] returns for a scheduled 6 month followup visit.  He is a 73 year old gentleman who had a combined coronary bypass grafting x1 and a right upper lobectomy via a median sternotomy in January of this year. 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 His postoperative course was palpated by prolonged air leak and a sternal wound infection that required sternal debridement and rectus flap coverage.  Troy [name] states that he's been having a lot of difficulty with pain in his knees. He is scheduled to have injections of the knees on Friday. He has been taking hydrocodone for that. He also has chronic back pain. He does have occasional pain in his abdomen and chest oftentimes related to positioning when he sleeps or reaching for something.  He has not had any anginal pain and his breathing has been stable. His weight is stable. He has an occasional cough, no hemoptysis.  Past Medical History  Diagnosis Date  . CAD (coronary artery disease)   . HTN (hypertension)   . COPD (chronic obstructive pulmonary disease)   . Hyperlipidemia   . Depression   . Vitamin D deficiency   . Sleep apnea   . Anginal pain   . Shortness of breath   . Arthritis   . Polycystic kidney disease       Current Outpatient Prescriptions  Medication Sig Dispense Refill  . aspirin 325 MG EC tablet Take 325 mg by mouth daily as needed (angina).      Marland Kitchen atorvastatin (LIPITOR) 20 MG tablet Take 20 mg by mouth daily.      . Cholecalciferol (VITAMIN D) 2000 UNITS tablet Take 2,000 Units by mouth once a week.      . diazepam (VALIUM) 10 MG tablet Take 10 mg by mouth 2 (two) times daily as needed for anxiety (for back spasms).      . furosemide (LASIX) 40 MG tablet Take 1 tablet (40 mg total) by mouth daily.  30 tablet  1  . guaiFENesin (MUCINEX) 600 MG 12 hr tablet Take 600 mg by mouth 2 (two) times daily as needed for cough or to loosen phlegm.      Marland Kitchen HYDROcodone-acetaminophen (NORCO)  10-325 MG per tablet Take 1 tablet by mouth every 6 (six) hours as needed.      Marland Kitchen ibuprofen (ADVIL,MOTRIN) 200 MG tablet Take 200 mg by mouth daily as needed for mild pain.       . montelukast (SINGULAIR) 10 MG tablet Take 10 mg by mouth daily.       . potassium chloride SA (K-DUR,KLOR-CON) 20 MEQ tablet Take 2 tablets (40 mEq total) by mouth daily.  60 tablet  1  . traMADol (ULTRAM) 50 MG tablet       . vitamin B-12 (CYANOCOBALAMIN) 1000 MCG tablet Take 1,000 mcg by mouth daily.      Marland Kitchen zolpidem (AMBIEN) 10 MG tablet Take 10 mg by mouth at bedtime as needed for sleep.        No current facility-administered medications for this visit.   Facility-Administered Medications Ordered in Other Visits  Medication Dose Route Frequency Provider Last Rate Last Dose  . chlorhexidine (HIBICLENS) 4 % liquid 2 application  30 mL Topical UD Melrose Nakayama, MD      . metoprolol tartrate (LOPRESSOR) tablet 12.5 mg  12.5 mg Oral Once Melrose Nakayama, MD        Physical Exam BP 140/90  Pulse 84  Resp 20  Ht 6' (1.829 m)  Wt 178 lb (80.74 kg)  BMI 24.14 kg/m2  SpO39 64% 73 year old man in no acute distress Alert and oriented x3 with no focal deficits Chest wall and abdominal wall incisions well healed Lungs clear with equal breath sounds bilaterally Cardiac regular rate and rhythm normal S1 and S2 No cervical or supraclavicular adenopathy  CT CHEST WITHOUT CONTRAST  TECHNIQUE:  Multidetector CT imaging of the chest was performed following the  standard protocol without IV contrast.  COMPARISON: Chest radiograph 07/18/2013. Most recent CT of  04/28/2013.  FINDINGS:  Lungs/Pleura: Surgical changes of right upper lobectomy.  Right middle lobe 2 mm subpleural nodule on image 35 is likely  similar to on the prior exam.  Mild right middle lobe posterior volume loss and scarring is  improved.  Moderate centrilobular and paraseptal emphysema. Scarring at the  left lung base laterally.  A  left lower lobe nodule along a vessel measures 4 mm on image 52 of  series 4. This is likely similar on image 74/series 6 of the prior  exam.  Removal of right chest tube and resolution of right pneumothorax. No  pleural fluid.  Heart/Mediastinum: Aortic and branch vessel atherosclerosis.  Tortuous thoracic aorta. Ascending aorta minimally dilated at 4.2  cm. Unchanged. Mild cardiomegaly. No pericardial effusion. Calcified  right paratracheal node, consistent with old granulomatous disease.  No mediastinal adenopathy. Hilar regions poorly evaluated without  intravenous contrast. Pulmonary artery enlargement, with the outflow  tract of 3.3 cm.  Surgical changes in the region of the gastroesophageal junction.  Stable small prevascular nodes which are likely reactive.  Upper Abdomen: Normal imaged portions of the liver, spleen, stomach,  adrenal glands. Mild pancreatic atrophy. Cholecystectomy. Renal  atrophy. Multiple bilateral renal masses. Primarily fluid density.  Small hyper attenuating lesions are identified in the upper pole  right kidney, grossly similar to on the prior exam. Dense aortic and  branch vessel atherosclerosis.  Bones/Musculoskeletal: Osteopenia. Similar mild mid thoracic  compression deformity. Incompletely imaged superior endplate  compression deformity at L1 is also not significantly changed.  IMPRESSION:  1. Status post right upper lobectomy, without evidence of recurrent  or metastatic disease.  2. Similar small pulmonary nodules. These warrant followup  attention.  3. Cardiomegaly with extensive atherosclerosis. Mild dilatation of  the ascending aorta, unchanged.  4. Pulmonary artery enlargement suggests pulmonary arterial  hypertension.  5. Upper pole renal lesions, including indeterminate right-sided  lesions. These are grossly similar to on the prior exam.  6. Thoracolumbar compression deformities, similar.   Impression: 73 year old gentleman who is now  6 months out from a combined coronary bypass grafting in right upper lobectomy. The right upper lobectomy was for a T1, N0 stage IA non-small cell carcinoma. He has no evidence of recurrent disease.  I will plan to see him back in 6 months with a repeat CT of the chest

## 2014-03-01 ENCOUNTER — Encounter (HOSPITAL_COMMUNITY): Payer: Self-pay | Admitting: Cardiovascular Disease

## 2014-03-06 ENCOUNTER — Encounter: Payer: Self-pay | Admitting: *Deleted

## 2014-03-07 ENCOUNTER — Other Ambulatory Visit: Payer: Self-pay | Admitting: *Deleted

## 2014-03-07 DIAGNOSIS — C3411 Malignant neoplasm of upper lobe, right bronchus or lung: Secondary | ICD-10-CM

## 2014-04-10 ENCOUNTER — Ambulatory Visit (INDEPENDENT_AMBULATORY_CARE_PROVIDER_SITE_OTHER): Payer: Medicare Other | Admitting: Thoracic Surgery (Cardiothoracic Vascular Surgery)

## 2014-04-10 ENCOUNTER — Ambulatory Visit
Admission: RE | Admit: 2014-04-10 | Discharge: 2014-04-10 | Disposition: A | Discharge status: Home / Self Care | Payer: Medicare Other | Source: Ambulatory Visit | Attending: Thoracic Surgery (Cardiothoracic Vascular Surgery) | Admitting: Thoracic Surgery (Cardiothoracic Vascular Surgery)

## 2014-04-10 ENCOUNTER — Encounter: Payer: Self-pay | Admitting: Thoracic Surgery (Cardiothoracic Vascular Surgery)

## 2014-04-10 VITALS — BP 172/103 | HR 72 | Resp 20 | Ht 72.0 in | Wt 173.0 lb

## 2014-04-10 DIAGNOSIS — Z951 Presence of aortocoronary bypass graft: Secondary | ICD-10-CM

## 2014-04-10 DIAGNOSIS — C3411 Malignant neoplasm of upper lobe, right bronchus or lung: Secondary | ICD-10-CM

## 2014-04-10 DIAGNOSIS — Z902 Acquired absence of lung [part of]: Secondary | ICD-10-CM

## 2014-04-10 DIAGNOSIS — Z9889 Other specified postprocedural states: Secondary | ICD-10-CM

## 2014-04-10 MED ORDER — METOPROLOL SUCCINATE ER 25 MG PO TB24: 25.0000 mg | ORAL_TABLET | Freq: Every day | ORAL | Status: AC

## 2014-04-10 NOTE — Progress Notes (Signed)
HPI:  [name] returns today for a scheduled follow-up visit.  He is an 74 year old gentleman who had a combined coronary bypass grafting 1 and right upper lobectomy via a median sternotomy in January 2015. Postoperative course was complicated by a prolonged air leak and then a sternal wound infection. He had to have a rectus flap to heal his sternal wound.  I last saw him in the office in July at which time he was doing well.  Since his last visit he says he has continued do well. He has distress that his blood pressure is elevated. He says that he went to the pain clinic 2 weeks ago and his blood pressure was elevated at 170/100. No new medications were started. He says his blood pressures had always run low in the past.  He does still have some pain over the sternal area and on the left anterior chest wall. He denies any anginal pain. He does have some shortness of breath with exertion but it stable. His weight has been stable. He is not having any unusual cough or hemoptysis. He denies wheezing.  Past Medical History  Diagnosis Date  . CAD (coronary artery disease)   . HTN (hypertension)   . COPD (chronic obstructive pulmonary disease)   . Hyperlipidemia   . Depression   . Vitamin D deficiency   . Sleep apnea   . Anginal pain   . Shortness of breath   . Arthritis   . Polycystic kidney disease    lung cancer non-small cell stage IA status post right upper lobectomy January 2015    Current Outpatient Prescriptions  Medication Sig Dispense Refill  . aspirin 325 MG EC tablet Take 325 mg by mouth daily as needed (angina).    . Cholecalciferol (VITAMIN D) 2000 UNITS tablet Take 2,000 Units by mouth once a week.    . diazepam (VALIUM) 10 MG tablet Take 10 mg by mouth 2 (two) times daily as needed for anxiety (for back spasms).    . furosemide (LASIX) 40 MG tablet Take 1 tablet (40 mg total) by mouth daily. (Patient taking differently: Take 40 mg by mouth daily. PRN) 30 tablet 1  .  guaiFENesin (MUCINEX) 600 MG 12 hr tablet Take 600 mg by mouth 2 (two) times daily as needed for cough or to loosen phlegm.    Marland Kitchen HYDROcodone-acetaminophen (NORCO) 10-325 MG per tablet Take 1 tablet by mouth every 6 (six) hours as needed.    Marland Kitchen ibuprofen (ADVIL,MOTRIN) 200 MG tablet Take 200 mg by mouth daily as needed for mild pain.     . montelukast (SINGULAIR) 10 MG tablet Take 10 mg by mouth daily.     . traMADol (ULTRAM) 50 MG tablet Take 50 mg by mouth 3 (three) times daily.     . vitamin B-12 (CYANOCOBALAMIN) 1000 MCG tablet Take 1,000 mcg by mouth daily.    Marland Kitchen zolpidem (AMBIEN) 10 MG tablet Take 10 mg by mouth at bedtime as needed for sleep.     . metoprolol succinate (TOPROL XL) 25 MG 24 hr tablet Take 1 tablet (25 mg total) by mouth daily. 30 tablet 5   No current facility-administered medications for this visit.   Facility-Administered Medications Ordered in Other Visits  Medication Dose Route Frequency Provider Last Rate Last Dose  . chlorhexidine (HIBICLENS) 4 % liquid 2 application  30 mL Topical UD Melrose Nakayama, MD        Physical Exam BP 172/103 mmHg  Pulse 72  Resp 20  Ht 6' (1.829 m)  Wt 173 lb (78.472 kg)  BMI 23.46 kg/m2  SpO66 35% 74 year old man in no acute distress Well-developed well-nourished Alert and oriented 3 with no focal deficits No cervical or subclavicular adenopathy Cardiac regular rate and rhythm normal S1 and S2 Lungs with diminished breath sounds bilaterally, no wheezing Incision well-healed  Diagnostic Tests: CT CHEST WITHOUT CONTRAST  TECHNIQUE: Multidetector CT imaging of the chest was performed following the standard protocol without IV contrast.  COMPARISON: Chest CT 10/10/2013 and 04/28/2013.  FINDINGS: Mediastinum: There are no enlarged mediastinal or hilar lymph nodes. Small mediastinal lymph nodes are unchanged. The hilar regions appear stable. The trachea, esophagus and thyroid gland demonstrate no significant  findings. The heart size is normal. There is diffuse atherosclerosis status post median sternotomy and CABG.  Lungs/Pleura: There is no pleural effusion.Stable postsurgical changes status post right upper lobectomy. Emphysema and left apical bullous changes are stable. There are stable tiny nodules in the right middle lobe (image 38) and in the left lower lobe (image 49). No suspicious nodules demonstrated.  Upper abdomen: Stable surgical clips at the gastroesophageal junction and gallbladder fossa. Multiple renal lesions are grossly stable, some hyperdense on the right. There is stable mild biliary dilatation.  Musculoskeletal/Chest wall: There is no axillary adenopathy or chest wall mass. Stable chronic compression deformities at T7 and L1. No worrisome osseous findings.  IMPRESSION: 1. Stable postoperative appearance of the chest status post right upper lobe resection. 2. No evidence of local recurrence or metastatic disease. 3. Stable emphysema, atherosclerosis and benign-appearing compression deformities.   Electronically Signed  By: Camie Patience M.D.  On: 04/10/2014 14:14   Impression: 74 year old gentleman who is now one year out from a right upper lobectomy for a stage IA non-small cell carcinoma and coronary bypass grafting 1 (saphenous vein graft to right coronary) for single-vessel coronary disease.  I have reviewed his CT scan personally. There is no evidence of recurrent disease. This concurs with the radiologist's findings. There is emphysema bilaterally.  He is not having any anginal-type chest pain.  He does have some residual discomfort and tenderness in the sternal area and the left anterior chest wall. This is minor and does not cause him any distress.  His blood pressure is elevated today at 172/103. He was similarly hypertensive 2 weeks ago at his pain clinic appointment. At this point I do think starting a blood pressure medication is warranted.  He has not been on any blood pressure medications previously. I am going to start him on Toprol-XL 25 mg daily.  He really has not established with a primary care in his home town. He says he is going to see a Dr. Joya Gaskins there. He also has not seen Dr. Johnsie Cancel since prior to his surgery. I will have my office contact his to see when he can get in for a cardiology follow-up.  Plan:  Toprol-XL 25 mg by mouth daily to treat hypertension  I will plan to see him back in 6 months to check on his progress. We will do another CT of the chest at that time. That will be his 18 month follow-up visit  Arrange follow-up with cardiology  Patient will establish with new primary care  Montez Morita.D.

## 2014-05-01 NOTE — Progress Notes (Signed)
Patient ID: Troy [name], male   DOB: 06/18/1940, 74 y.o.   MRN: [id number] 74 y.o.  F/U post combined RULobectomy and CABG by Dr Roxan Hockey.  Post op course complicated by prolonged air leak and sternal wound infection requiring rectus Flap surgery.  Multiple surgeries don 1/15-3/15  Had single off pump free RIMA to RCA native vessel 100% occluded with collaterals  No evidence on f/u CT of metastatic Disease Started on Toprol for HTN by Dr Roxan Hockey recently     ROS: Denies fever, malais, weight loss, blurry vision, decreased visual acuity, cough, sputum, SOB, hemoptysis, pleuritic pain, palpitaitons, heartburn, abdominal pain, melena, lower extremity edema, claudication, or rash.  All other systems reviewed and negative  General: Affect appropriate Healthy:  appears stated age 74: normal Neck supple with no adenopathy JVP normal no bruits no thyromegaly Lungs clear with no wheezing and good diaphragmatic motion Heart:  S1/S2 no murmur, no rub, gallop or click PMI normal Abdomen: benighn, BS positve, no tenderness, no AAA no bruit.  No HSM or HJR Distal pulses intact with no bruits No edema Neuro non-focal Skin warm and dry No muscular weakness   Current Outpatient Prescriptions  Medication Sig Dispense Refill  . aspirin 325 MG EC tablet Take 325 mg by mouth daily as needed (angina).    . Cholecalciferol (VITAMIN D) 2000 UNITS tablet Take 2,000 Units by mouth once a week.    . diazepam (VALIUM) 10 MG tablet Take 10 mg by mouth 2 (two) times daily as needed for anxiety (for back spasms).    . furosemide (LASIX) 40 MG tablet Take 1 tablet (40 mg total) by mouth daily. (Patient taking differently: Take 40 mg by mouth daily. PRN) 30 tablet 1  . guaiFENesin (MUCINEX) 600 MG 12 hr tablet Take 600 mg by mouth 2 (two) times daily as needed for cough or to loosen phlegm.    Marland Kitchen HYDROcodone-acetaminophen (NORCO) 10-325 MG per tablet Take 1 tablet by mouth every 6 (six) hours as needed.     Marland Kitchen ibuprofen (ADVIL,MOTRIN) 200 MG tablet Take 200 mg by mouth daily as needed for mild pain.     . metoprolol succinate (TOPROL XL) 25 MG 24 hr tablet Take 1 tablet (25 mg total) by mouth daily. 30 tablet 5  . montelukast (SINGULAIR) 10 MG tablet Take 10 mg by mouth daily.     . traMADol (ULTRAM) 50 MG tablet Take 50 mg by mouth 3 (three) times daily.     . vitamin B-12 (CYANOCOBALAMIN) 1000 MCG tablet Take 1,000 mcg by mouth daily.    Marland Kitchen zolpidem (AMBIEN) 10 MG tablet Take 10 mg by mouth at bedtime as needed for sleep.      No current facility-administered medications for this visit.   Facility-Administered Medications Ordered in Other Visits  Medication Dose Route Frequency Provider Last Rate Last Dose  . chlorhexidine (HIBICLENS) 4 % liquid 2 application  30 mL Topical UD Melrose Nakayama, MD        Allergies  Augmentin; Flagyl; and Tape  Electrocardiogram: 2/15  SR rate 71 post op J point elevation in inferior leads   Assessment and Plan

## 2014-05-02 ENCOUNTER — Encounter: Payer: Medicare Other | Admitting: Cardiovascular Disease

## 2014-05-16 NOTE — Progress Notes (Signed)
Patient ID: Troy [name], male   DOB: [birth year], 74 y.o.   MRN: [id number]   74 y.o.  F/u CAD and lung CA  Seen a lot by Dr Roxan Hockey  Initial surgery done 1/16  PROCEDURE: Median sternotomy, off pump coronary bypass grafting x1 (free right internal mammary artery to distal right coronary artery), right upper lobectomy.  Complicated by prolonged airleak and sternal wound infection requiring multiple surgeries and rectus muscle flap.  Recent increase in BP and started on Toprol By Dr Roxan Hockey  Having some SSCP  Lasts seconds but sometimes exertional has not taken nitro  BP better on meds     ROS: Denies fever, malais, weight loss, blurry vision, decreased visual acuity, cough, sputum, SOB, hemoptysis, pleuritic pain, palpitaitons, heartburn, abdominal pain, melena, lower extremity edema, claudication, or rash.  All other systems reviewed and negative  General: Affect appropriate Healthy:  appears stated age 74: normal Neck supple with no adenopathy JVP normal no bruits no thyromegaly Lungs clear with no wheezing and good diaphragmatic motion Heart:  S1/S2 no murmur, no rub, gallop or click  Sternotomy  PMI normal Abdomen: benighn, BS positve, no tenderness, no AAA no bruit.  No HSM or HJR Distal pulses intact with no bruits No edema Neuro non-focal Skin warm and dry No muscular weakness   Current Outpatient Prescriptions  Medication Sig Dispense Refill  . aspirin 325 MG EC tablet Take 325 mg by mouth daily as needed (angina).    . Cholecalciferol (VITAMIN D) 2000 UNITS tablet Take 2,000 Units by mouth once a week.    . diazepam (VALIUM) 10 MG tablet Take 10 mg by mouth 2 (two) times daily as needed for anxiety (for back spasms).    . furosemide (LASIX) 40 MG tablet Take 1 tablet (40 mg total) by mouth daily. (Patient taking differently: Take 40 mg by mouth daily. PRN) 30 tablet 1  . guaiFENesin (MUCINEX) 600 MG 12 hr tablet Take 600 mg by mouth 2 (two) times daily as  needed for cough or to loosen phlegm.    Marland Kitchen HYDROcodone-acetaminophen (NORCO) 10-325 MG per tablet Take 1 tablet by mouth every 6 (six) hours as needed.    Marland Kitchen ibuprofen (ADVIL,MOTRIN) 200 MG tablet Take 200 mg by mouth daily as needed for mild pain.     . metoprolol succinate (TOPROL XL) 25 MG 24 hr tablet Take 1 tablet (25 mg total) by mouth daily. 30 tablet 5  . montelukast (SINGULAIR) 10 MG tablet Take 10 mg by mouth daily.     . traMADol (ULTRAM) 50 MG tablet Take 50 mg by mouth 3 (three) times daily.     . vitamin B-12 (CYANOCOBALAMIN) 1000 MCG tablet Take 1,000 mcg by mouth daily.    Marland Kitchen zolpidem (AMBIEN) 10 MG tablet Take 10 mg by mouth at bedtime as needed for sleep.      No current facility-administered medications for this visit.   Facility-Administered Medications Ordered in Other Visits  Medication Dose Route Frequency Provider Last Rate Last Dose  . chlorhexidine (HIBICLENS) 4 % liquid 2 application  30 mL Topical UD Melrose Nakayama, MD        Allergies  Augmentin; Flagyl; and Tape  Electrocardiogram:  SR rate 80 normal  05/17/13  Assessment and Plan

## 2014-05-17 ENCOUNTER — Encounter: Payer: Self-pay | Admitting: Cardiovascular Disease

## 2014-05-17 ENCOUNTER — Ambulatory Visit (INDEPENDENT_AMBULATORY_CARE_PROVIDER_SITE_OTHER): Payer: Medicare Other | Admitting: Cardiovascular Disease

## 2014-05-17 DIAGNOSIS — I10 Essential (primary) hypertension: Secondary | ICD-10-CM

## 2014-05-17 DIAGNOSIS — J432 Centrilobular emphysema: Secondary | ICD-10-CM

## 2014-05-17 DIAGNOSIS — R079 Chest pain, unspecified: Secondary | ICD-10-CM

## 2014-05-17 NOTE — Patient Instructions (Signed)
Your physician wants you to follow-up in:   [place]' You will receive a reminder letter in the mail two months in advance. If you don't receive a letter, please call our office to schedule the follow-up appointment. Your physician recommends that you continue on your current medications as directed. Please refer to the Current Medication list given to you today.   Your physician has requested that you have a lexiscan myoview. For further information please visit [email or web address]. Please follow instruction sheet, as given.

## 2014-05-17 NOTE — Assessment & Plan Note (Signed)
Dyspnea related to lung disease with RULobectomy CA  F/U pulmonary Lungs clear on exam today

## 2014-05-17 NOTE — Assessment & Plan Note (Signed)
Only had free RIMA to RCA  With plastic surgery and infection increased risk of stenosis  F/U lexiscan myovue to r/o ischemia

## 2014-05-17 NOTE — Assessment & Plan Note (Signed)
Improved continue low sodium diet and beta blocker

## 2014-05-28 ENCOUNTER — Ambulatory Visit (HOSPITAL_COMMUNITY): Payer: Medicare Other | Attending: Internal Medicine | Admitting: Radiology

## 2014-05-28 DIAGNOSIS — R079 Chest pain, unspecified: Secondary | ICD-10-CM | POA: Diagnosis not present

## 2014-05-28 MED ORDER — TECHNETIUM TC 99M SESTAMIBI GENERIC - CARDIOLITE
30.0000 | Freq: Once | INTRAVENOUS | Status: AC | PRN
  Administered 2014-05-28: 30 via INTRAVENOUS

## 2014-05-28 MED ORDER — REGADENOSON 0.4 MG/5ML IV SOLN
0.4000 mg | Freq: Once | INTRAVENOUS | Status: AC
Start: 2014-05-28 — End: 2014-05-28
  Administered 2014-05-28: 0.4 mg via INTRAVENOUS

## 2014-05-28 MED ORDER — TECHNETIUM TC 99M SESTAMIBI GENERIC - CARDIOLITE
10.0000 | Freq: Once | INTRAVENOUS | Status: AC | PRN
  Administered 2014-05-28: 10 via INTRAVENOUS

## 2014-05-28 NOTE — Progress Notes (Signed)
  [name] [address] [place], [place] [address] [phone]    Cardiology Nuclear Med Study  Troy [name] is a 74 y.o. male     MRN : [id number]     DOB: Feb 21, 1941  Procedure Date: 05/28/2014  Nuclear Med Background Indication for Stress Test:  Evaluation for Ischemia and Follow up CAD History:  CAD, MPI 2015 (ischemia) EF 53%, Emphysema Cardiac Risk Factors: Hypertension and Lipids  Symptoms:  Chest Pain and SOB   Nuclear Pre-Procedure Caffeine/Decaff Intake:  8:00pm NPO After: 8:00pm   Lungs:  clear O2 Sat: 91 % on room air. IV 0.9% NS with Angio Cath:  22g  IV Site: R Hand  IV Started by:  Matilde Haymaker, RN  Chest Size (in):  42 Cup Size: n/a  Height: 6' (1.829 m)  Weight:  178 lb (80.74 kg)  BMI:  Body mass index is 24.14 kg/(m^2). Tech Comments:  Metoprolol taken at 0500 today    Nuclear Med Study 1 or 2 day study: 1 day  Stress Test Type:  Carlton Adam  Reading MD: n/a  Order Authorizing Provider:  Verlin Dike  Resting Radionuclide: Technetium 34m Sestamibi  Resting Radionuclide Dose: 11.0 mCi   Stress Radionuclide:  Technetium 76m Sestamibi  Stress Radionuclide Dose: 33.0 mCi           Stress Protocol Rest HR: 57 Stress HR: 83  Rest BP: 140/99 Stress BP: 148/92  Exercise Time (min): n/a METS: n/a   Predicted Max HR: 148 bpm % Max HR: 56.08 bpm Rate Pressure Product: 13197   Dose of Adenosine (mg):  n/a Dose of Lexiscan: 0.4 mg  Dose of Atropine (mg): n/a Dose of Dobutamine: n/a mcg/kg/min (at max HR)  Stress Test Technologist: Glade Lloyd, BS-ES  Nuclear Technologist:  Earl Many, CNMT     Rest Procedure:  Myocardial perfusion imaging was performed at rest 45 minutes following the intravenous administration of Technetium 109m Sestamibi. Rest ECG: NSR - Normal EKG  Stress Procedure:  The patient received IV Lexiscan 0.4 mg over 15-seconds.  Technetium 75m Sestamibi injected at 30-seconds.  Quantitative spect  images were obtained after a 45 minute delay.  During the infusion of Lexiscan the patient complained of feeling strange and stomach upset.  These symtoms began to resolve in recovery.  Stress ECG: No significant change from baseline ECG  QPS Raw Data Images:  There is interference from nuclear activity from structures below the diaphragm. This does not affect the ability to read the study. Stress Images:  Normal homogeneous uptake in all areas of the myocardium. Rest Images:  Normal homogeneous uptake in all areas of the myocardium. Subtraction (SDS):  No evidence of ischemia. Transient Ischemic Dilatation (Normal <1.22):  1.00 Lung/Heart Ratio (Normal <0.45):  0.26  Quantitative Gated Spect Images QGS EDV:  113 ml QGS ESV:  48 ml  Impression Exercise Capacity:  Lexiscan with no exercise. BP Response:  Normal blood pressure response. Clinical Symptoms:  No significant symptoms noted. ECG Impression:  No significant ST segment change suggestive of ischemia. Comparison with Prior Nuclear Study: No images to compare  Overall Impression:  Normal stress nuclear study.  LV Ejection Fraction: 58%.  LV Wall Motion:  NL LV Function; NL Wall Motion   [name] 05/28/2014

## 2014-06-14 ENCOUNTER — Other Ambulatory Visit: Payer: Self-pay | Admitting: Physician Assistant

## 2014-09-14 ENCOUNTER — Other Ambulatory Visit: Payer: Self-pay | Admitting: *Deleted

## 2014-09-14 DIAGNOSIS — C3411 Malignant neoplasm of upper lobe, right bronchus or lung: Secondary | ICD-10-CM

## 2014-10-15 IMAGING — DX DG CHEST 1V PORT
1 series · 1 of 1 positions shown · non-contrast
Comparison: May 01, 2013

CLINICAL DATA: Shortness of Breath; pneumothorax

EXAM:
PORTABLE CHEST - 1 VIEW

[portable]
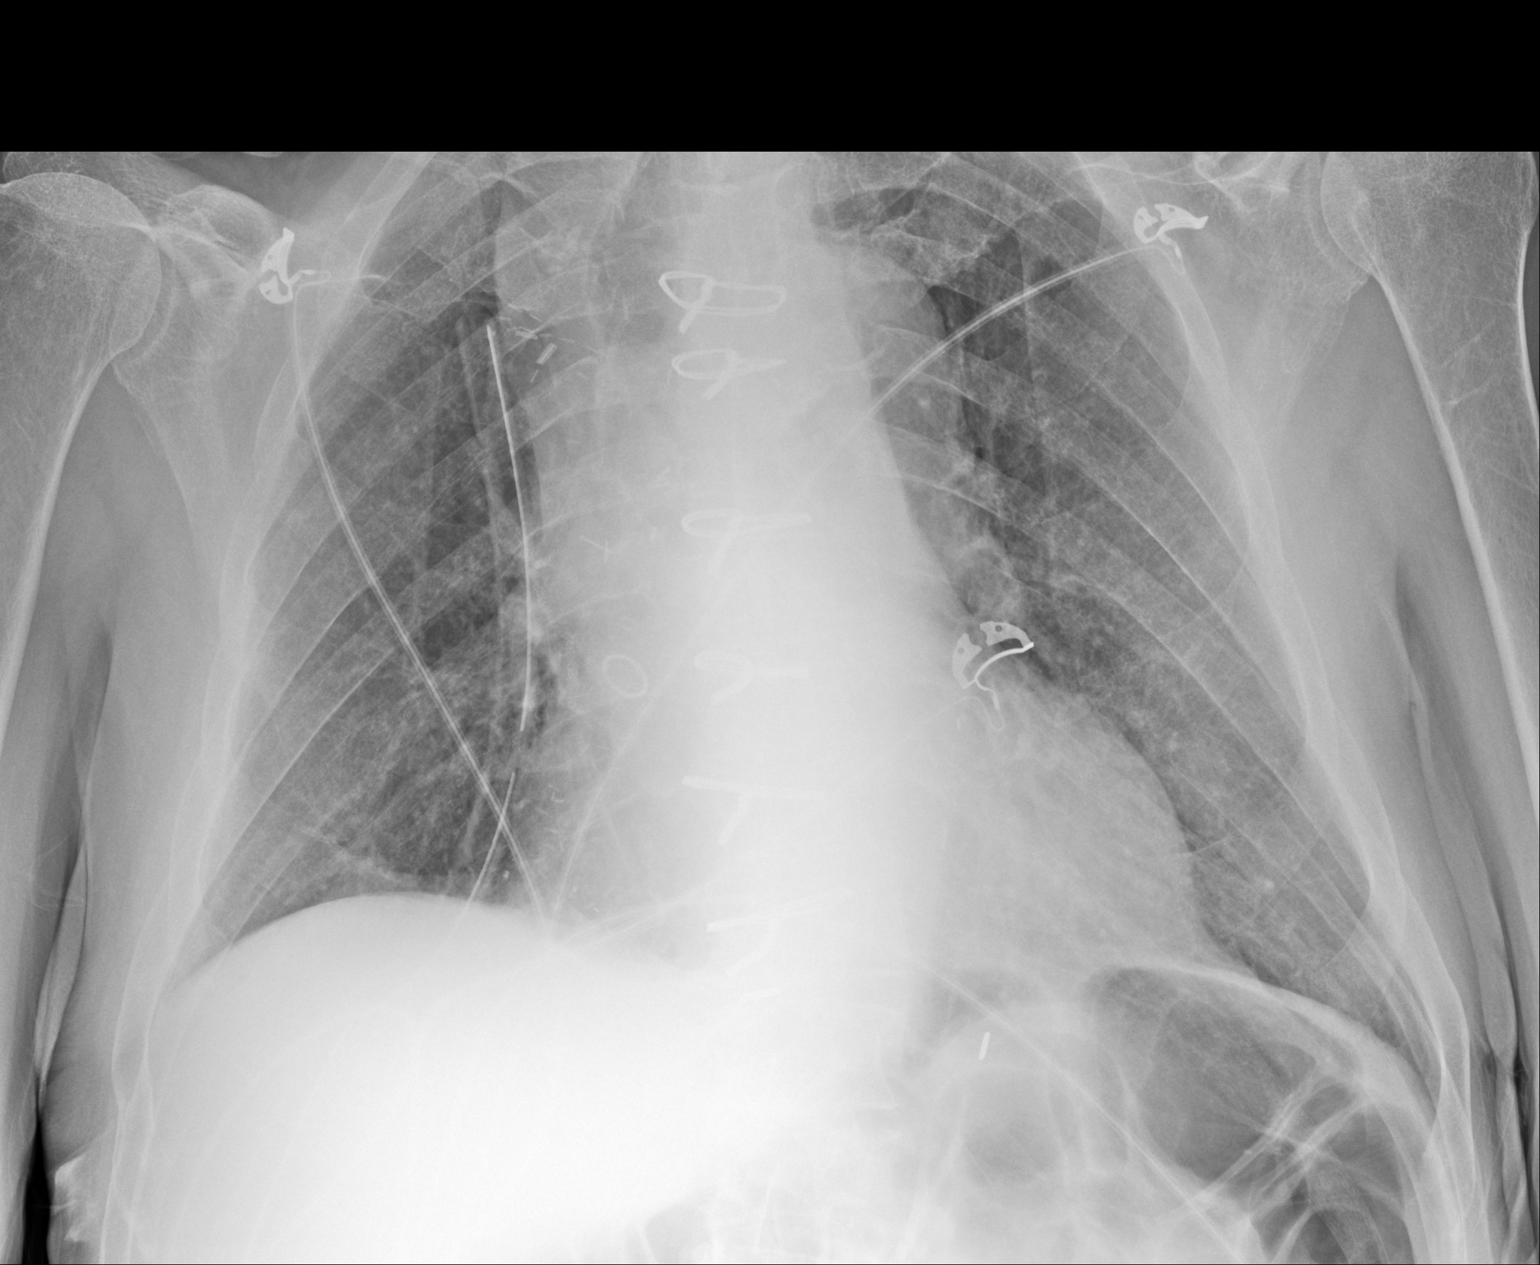

[1 of 1 positions shown; findings below may reference images not displayed]

FINDINGS: There is a chest tube on the right. The previously noted right-sided
pneumothorax is much smaller, now with only small right apical
pneumothorax component. There is no tension component.

There is no edema or consolidation. There is underlying emphysema.
Heart is mildly enlarged, stable. There is postoperative change on
the right as well as evidence of previous coronary artery bypass
grafting. No bone lesions.
IMPRESSION: Underlying emphysema. No edema or consolidation. There is now only a
rather minimal right apical pneumothorax, much smaller than 1 day
prior.

## 2014-10-16 ENCOUNTER — Encounter: Payer: Self-pay | Admitting: Thoracic Surgery (Cardiothoracic Vascular Surgery)

## 2014-10-16 ENCOUNTER — Ambulatory Visit (INDEPENDENT_AMBULATORY_CARE_PROVIDER_SITE_OTHER): Payer: Medicare Other | Admitting: Thoracic Surgery (Cardiothoracic Vascular Surgery)

## 2014-10-16 ENCOUNTER — Ambulatory Visit
Admission: RE | Admit: 2014-10-16 | Discharge: 2014-10-16 | Disposition: A | Discharge status: Home / Self Care | Payer: Medicare Other | Source: Ambulatory Visit | Attending: Thoracic Surgery (Cardiothoracic Vascular Surgery) | Admitting: Thoracic Surgery (Cardiothoracic Vascular Surgery)

## 2014-10-16 VITALS — BP 165/88 | HR 62 | Resp 20 | Ht 72.0 in | Wt 179.0 lb

## 2014-10-16 DIAGNOSIS — C3491 Malignant neoplasm of unspecified part of right bronchus or lung: Secondary | ICD-10-CM | POA: Diagnosis not present

## 2014-10-16 DIAGNOSIS — Z951 Presence of aortocoronary bypass graft: Secondary | ICD-10-CM | POA: Diagnosis not present

## 2014-10-16 DIAGNOSIS — Z9889 Other specified postprocedural states: Secondary | ICD-10-CM

## 2014-10-16 DIAGNOSIS — C3411 Malignant neoplasm of upper lobe, right bronchus or lung: Secondary | ICD-10-CM

## 2014-10-16 DIAGNOSIS — Z902 Acquired absence of lung [part of]: Secondary | ICD-10-CM

## 2014-10-16 IMAGING — CR DG CHEST 1V PORT
1 series · 1 of 1 positions shown · non-contrast
Comparison: DG CHEST 1V PORT dated 05/03/2013;

CLINICAL DATA: Right chest tube removal.

EXAM:
PORTABLE CHEST - 1 VIEW

[AP]
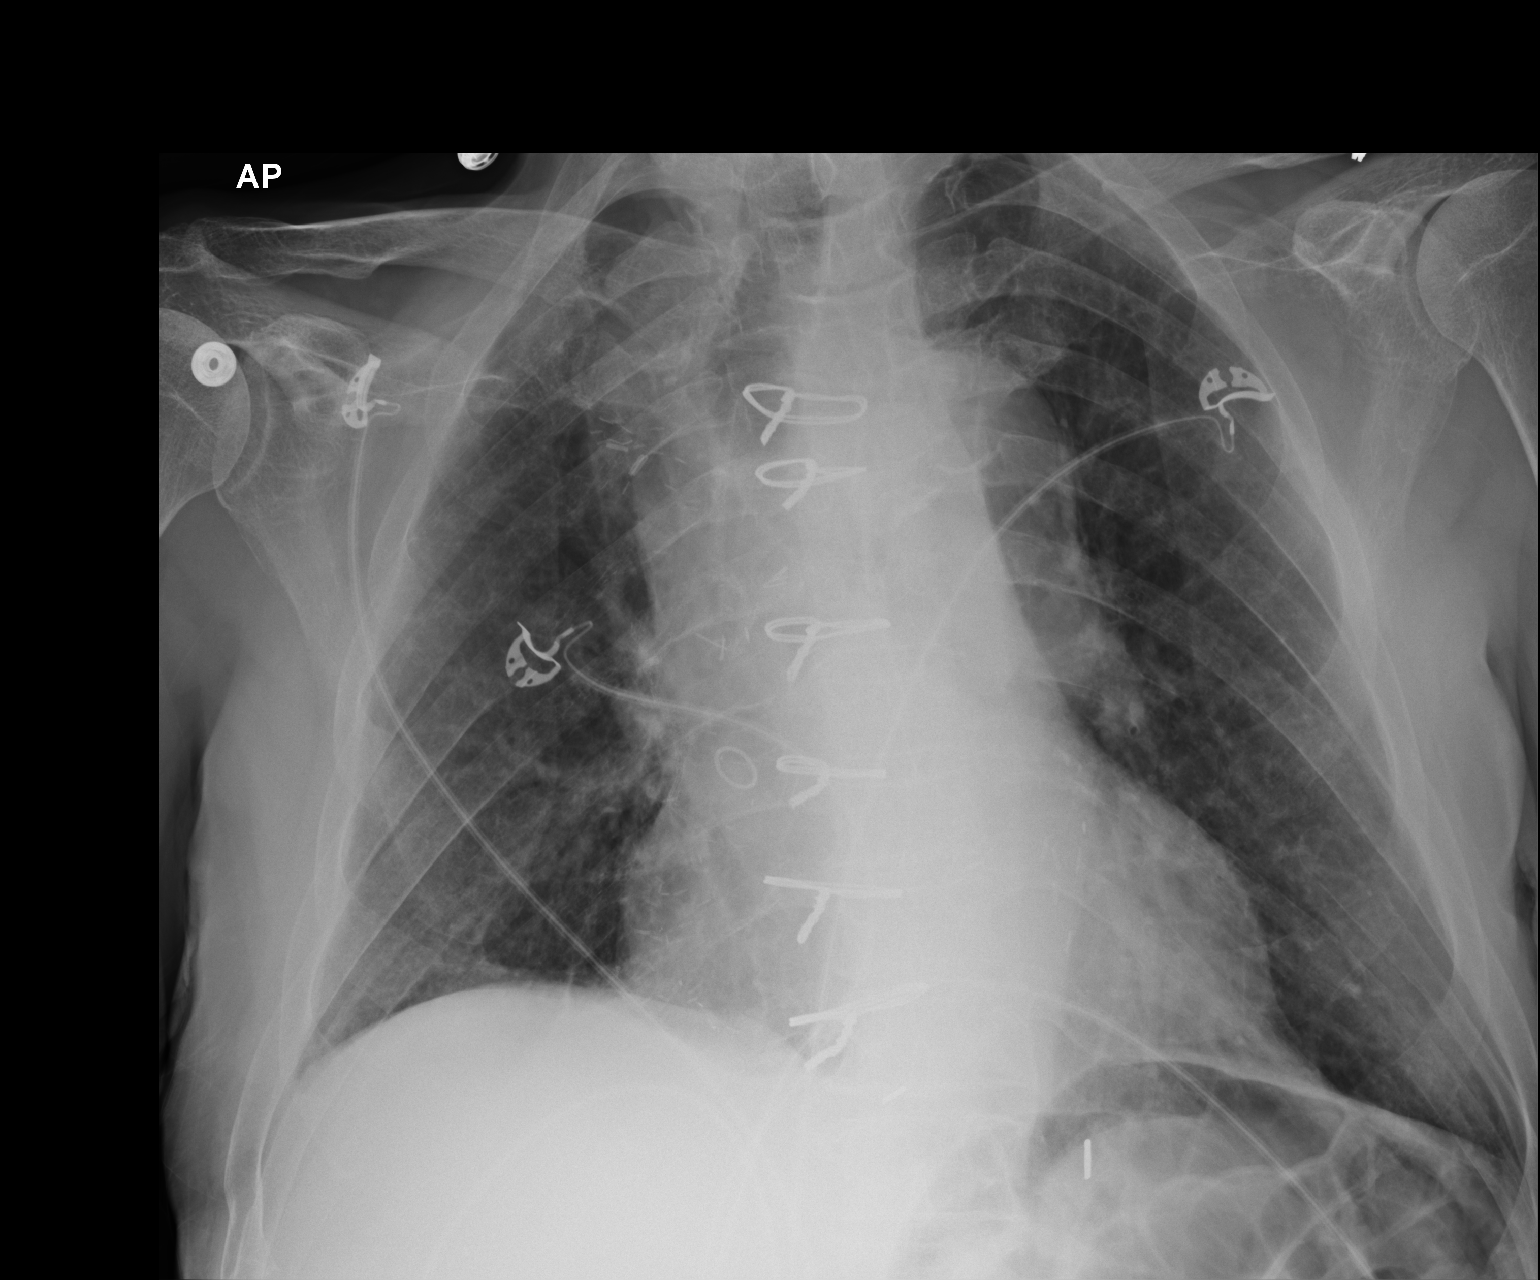

[1 of 1 positions shown; findings below may reference images not displayed]

CT ANGIO CHEST W/CM
&/OR WO/CM dated 04/28/2013; DG CHEST 2 VIEW dated 05/01/2013
FINDINGS: The right chest tube has been removed. No appreciable pneumothorax.
Continued volume loss on the right. Mild cardiomegaly and tortuosity
of the thoracic aorta.
IMPRESSION: 1. No pneumothorax, s/p chest tube removal.  Otherwise stable.

## 2014-10-16 NOTE — Progress Notes (Signed)
[address]       [place] [address]             [phone]       HPI: Troy [name] returns today for a scheduled 6 month follow-up visit.  He is a 74 year old man who underwent a combined coronary bypass grafting 1 and right upper lobectomy via a median sternotomy in January 2015. His postoperative course was complicated by prolonged air leak and a sternal wound infection requiring a flap.  He was last seen in the office in January 2016 for his one-year follow-up visit. He had no evidence recurrent disease. He was markedly hypertensive at that visit I started him on Toprol-XL 25 mg daily.  He saw Dr. Johnsie Cancel in March. He had a normal stress test in April.  He says that he still has some occasional twinges of pain in his chest incision. He has not had any anginal-type pain. His breathing has not changed since his last visit. He's having a lot of pain in his back and his knees. He is having a nerve injection on his knees later this week. His appetite is good. His weight is stable.  Past Medical History  Diagnosis Date  . CAD (coronary artery disease)   . HTN (hypertension)   . COPD (chronic obstructive pulmonary disease)   . Hyperlipidemia   . Depression   . Vitamin D deficiency   . Sleep apnea   . Anginal pain   . Shortness of breath   . Arthritis   . Polycystic kidney disease   . 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 Cholecystectomy    . Hernia repair      x 11  . Angioplasty    . Cardiac catheterization      multiple  . Carpal tunnel release Right   . Spinal cord stimulator implant    . Back surgery    . Vagotomy    . Appendectomy    . Gastrectomy    . Nissen fundoplication    . Coronary artery bypass graft N/A 04/20/2013    Procedure: OFF PUMP CORONARY ARTERY BYPASS GRAFTING (CABG);  Surgeon: Melrose Nakayama, MD;  Location: [facility];  Service:  Open Heart Surgery;  Laterality: N/A;  CABG X 1  POSSIBLE OFF PUMP  . Intraoperative transesophageal echocardiogram N/A 04/20/2013    Procedure: INTRAOPERATIVE TRANSESOPHAGEAL ECHOCARDIOGRAM;  Surgeon: Melrose Nakayama, MD;  Location: [facility];  Service: Open Heart Surgery;  Laterality: N/A;  . Lobectomy  04/20/2013    Procedure: LOBECTOMY;  Surgeon: Melrose Nakayama, MD;  Location: [facility];  Service: Open Heart Surgery;;  Right Upper Lobectomy  . Sternal wound debridement N/A 05/15/2013    Procedure: STERNAL WOUND DEBRIDEMENT;  Surgeon: Melrose Nakayama, MD;  Location: [facility];  Service: Thoracic;  Laterality: N/A;  . Application of wound vac N/A 05/15/2013    Procedure: APPLICATION OF WOUND VAC;  Surgeon: Melrose Nakayama, MD;  Location: [facility];  Service: Thoracic;  Laterality: N/A;  . Sternal wound debridement N/A 05/17/2013    Procedure: STERNAL WOUND DEBRIDEMENT;  Surgeon: Melrose Nakayama, MD;  Location: [facility];  Service: Thoracic;  Laterality: N/A;  . Application of wound vac N/A 05/17/2013    Procedure: WOUND VAC CHANGE;  Surgeon: Melrose Nakayama, MD;  Location: [facility];  Service: Vascular;  Laterality: N/A;  . Tram Left 05/23/2013    Procedure: LEFT RECTUS ABDOMINIS MUSCLE FLAP TO CHEST;  Surgeon: Irene Limbo, MD;  Location: [facility];  Service: Plastics;  Laterality: Left;  . Left heart catheterization with coronary angiogram N/A 03/01/2013    Procedure: LEFT HEART CATHETERIZATION WITH CORONARY ANGIOGRAM;  Surgeon: Josue Hector, MD;  Location: [facility] CATH LAB;  Service: Cardiovascular;  Laterality: N/A;       Current Outpatient Prescriptions  Medication Sig Dispense Refill  . aspirin 325 MG EC tablet Take 325 mg by mouth daily as needed (angina).    . Cholecalciferol (VITAMIN D3) 50000 UNITS TABS Take by mouth every 7 (seven) weeks. Takes every Saturday    . diazepam (VALIUM) 10 MG tablet Take 10 mg by mouth 2 (two) times daily as needed for anxiety (for back spasms).    .  furosemide (LASIX) 40 MG tablet Take 1 tablet (40 mg total) by mouth daily. (Patient taking differently: Take 40 mg by mouth daily. PRN) 30 tablet 1  . guaiFENesin (MUCINEX) 600 MG 12 hr tablet Take 600 mg by mouth 2 (two) times daily as needed for cough or to loosen phlegm.    Marland Kitchen HYDROcodone-acetaminophen (NORCO) 10-325 MG per tablet Take 1 tablet by mouth every 6 (six) hours as needed.    Marland Kitchen ibuprofen (ADVIL,MOTRIN) 200 MG tablet Take 200 mg by mouth daily as needed for mild pain.     . metoprolol succinate (TOPROL XL) 25 MG 24 hr tablet Take 1 tablet (25 mg total) by mouth daily. 30 tablet 5  . montelukast (SINGULAIR) 10 MG tablet Take 10 mg by mouth daily.     . traMADol (ULTRAM) 50 MG tablet Take 50 mg by mouth 3 (three) times daily.     . vitamin B-12 (CYANOCOBALAMIN) 1000 MCG tablet Take 1,000 mcg by mouth daily.     No current facility-administered medications for this visit.   Facility-Administered Medications Ordered in Other Visits  Medication Dose Route Frequency Provider Last Rate Last Dose  . chlorhexidine (HIBICLENS) 4 % liquid 2 application  30 mL Topical UD Melrose Nakayama, MD        Physical Exam BP 165/88 mmHg  Pulse 62  Resp 20  Ht 6' (1.829 m)  Wt 179 lb (81.194 kg)  BMI 24.27 kg/m2  SpO20 15% 74 year old man in no acute distress Well-developed and well-nourished Alert and oriented 3 with no focal neurologic deficit No cervical or subclavicular adenopathy Lungs with diminished breath sounds right base, otherwise clear Cardiac regular rate and rhythm normal S1 and S2 Sternal incision well-healed  Diagnostic Tests: CT CHEST WITHOUT CONTRAST  TECHNIQUE: Multidetector CT imaging of the chest was performed following the standard protocol without IV contrast.  COMPARISON: CT 03/28/1914  FINDINGS: Mediastinum/Nodes: No axillary or supraclavicular adenopathy. No mediastinal hilar adenopathy. Midline sternotomy. Post CABG anatomy. Esophagus  normal.  Lungs/Pleura: There is volume loss in the right hemi thorax consistent with partial resection. No nodularity at the resection margin in the right upper lobe. Left lung is clear. Paraseptal emphysema noted in both lungs.  Upper abdomen: Adrenal glands are normal. Stable cystic change of the kidneys.  Musculoskeletal: No aggressive osseous lesion. Stable compression deformities in the mid and lower thoracic spine.  IMPRESSION: 1. No evidence of lung cancer recurrence status post partial right upper lobectomy. 2. No evidence of metastatic disease. 3. Paraseptal emphysema.   Electronically Signed  By: Suzy Bouchard M.D.  On: 10/16/2014 16:04  I personally reviewed  the CT chest and concur with the findings as noted above  Impression: 74 year old man who is now 18 months out from a bypass grafting and right upper lobectomy for stage IA non-small cell carcinoma. He has no evidence of recurrent disease.  He is blood pressure is elevated again today. Is not as high as it was at his last visit, but is certainly higher than ideal. He thinks is because his knee pain is causing him a lot of problems. I recommended that he follow-up with his primary care physician to have that rechecked after he gets his knee procedure done on Friday.  Plan: Return in 6 months with CT of chest for 2 year follow-up visit  I spent 10 minutes face-to-face with Mr. Larock, greater than 50% was counseling.  Melrose Nakayama, MD [facility] [phone]

## 2014-10-17 IMAGING — CR DG CHEST 2V
2 series · 2 of 2 positions shown · non-contrast
Comparison: May 03, 2013.

CLINICAL DATA: S/p coronary artery bypass graft.

EXAM:
CHEST  2 VIEW

[w chest pa]
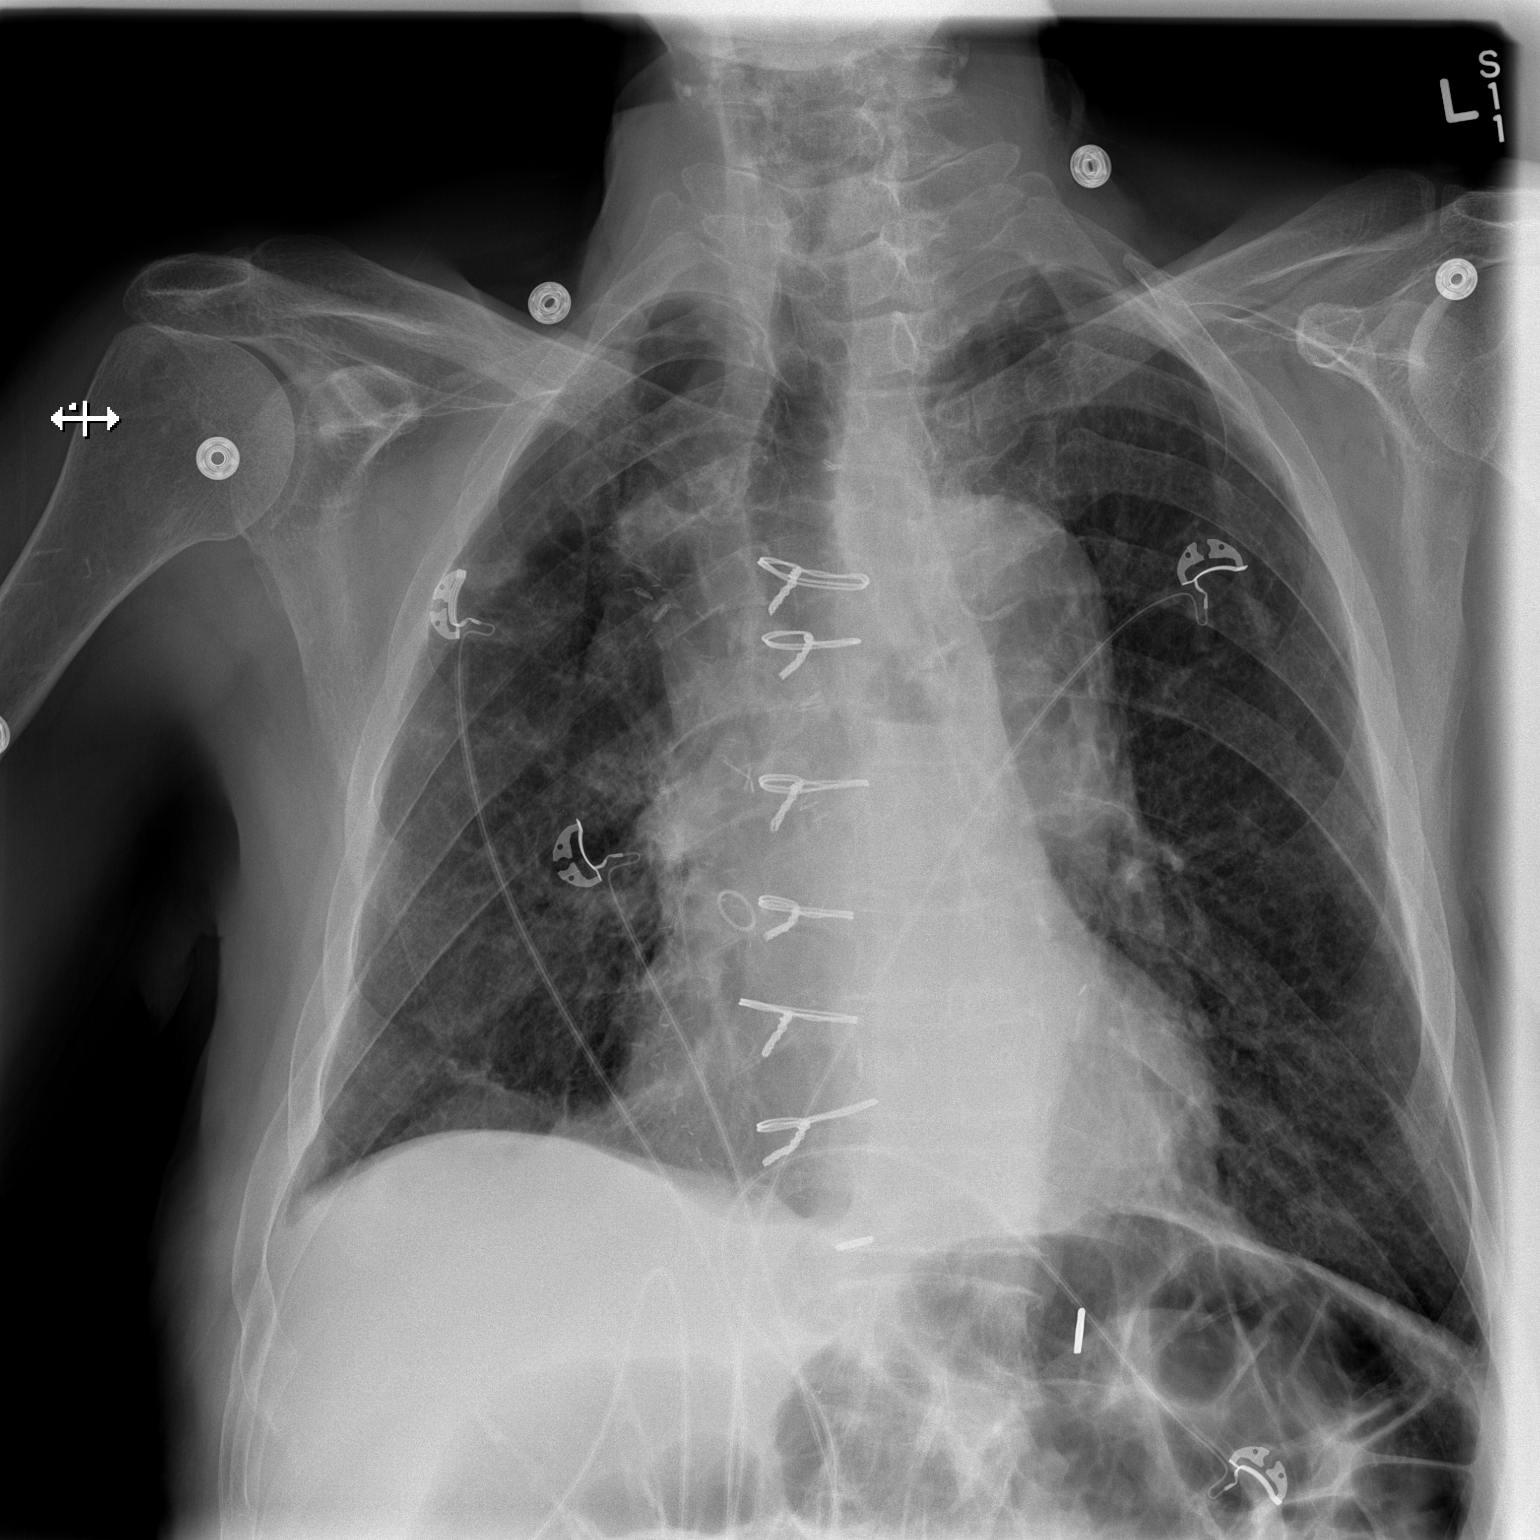

[w chest lat]
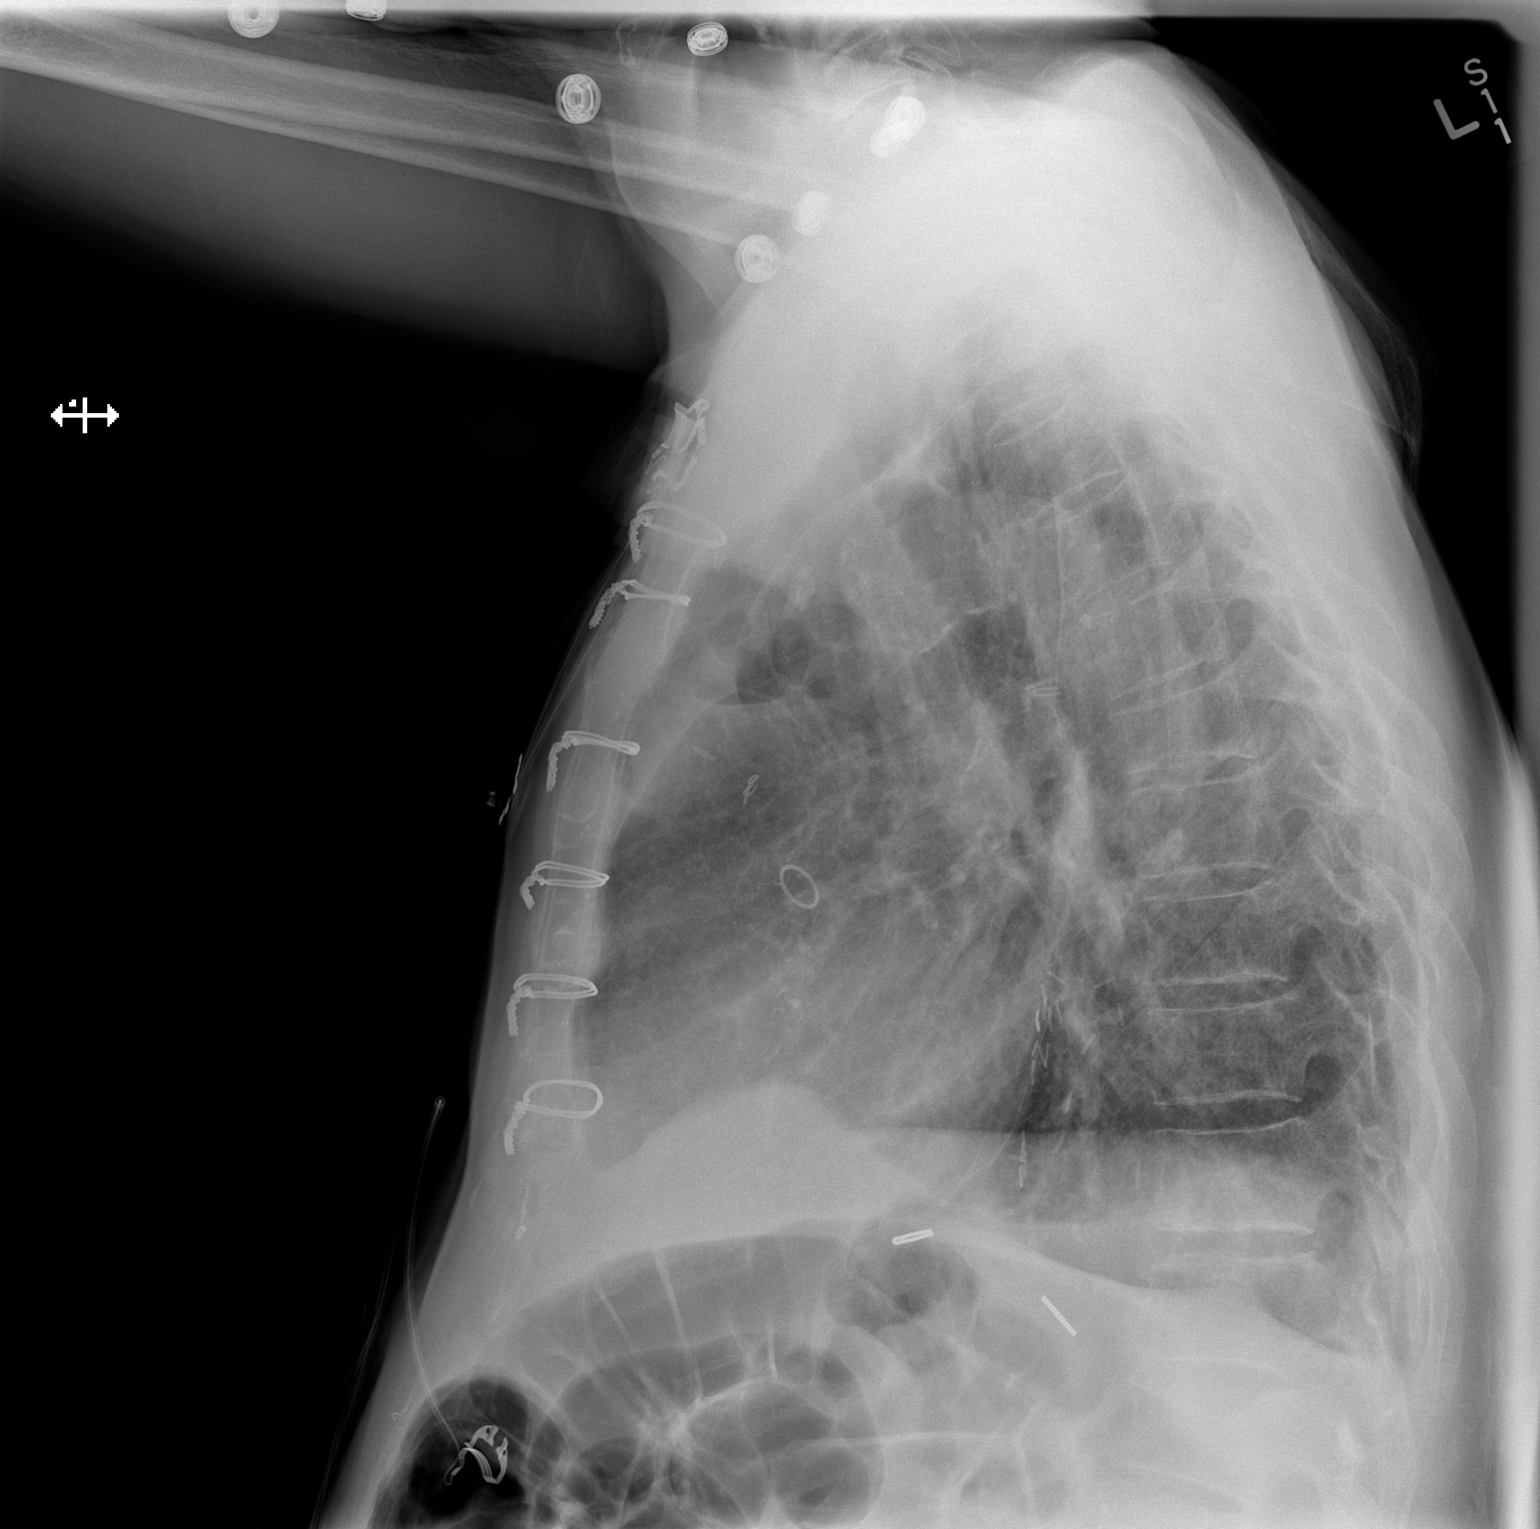

[2 of 2 positions shown; findings below may reference images not displayed]

FINDINGS: Status post coronary artery bypass graft. No pneumothorax is seen.
No significant pleural effusion is noted. Atherosclerotic
calcifications of thoracic aorta are noted. No acute pulmonary
disease is noted. Stable cardiomediastinal silhouette.
IMPRESSION: No pneumothorax seen status post coronary artery bypass graft.

## 2014-11-11 IMAGING — CR DG CHEST 1V PORT
1 series · 1 of 1 positions shown · non-contrast
Comparison: 05/27/2013

CLINICAL DATA: Evaluate left upper lung opacity.

EXAM:
PORTABLE CHEST - 1 VIEW

[AP]
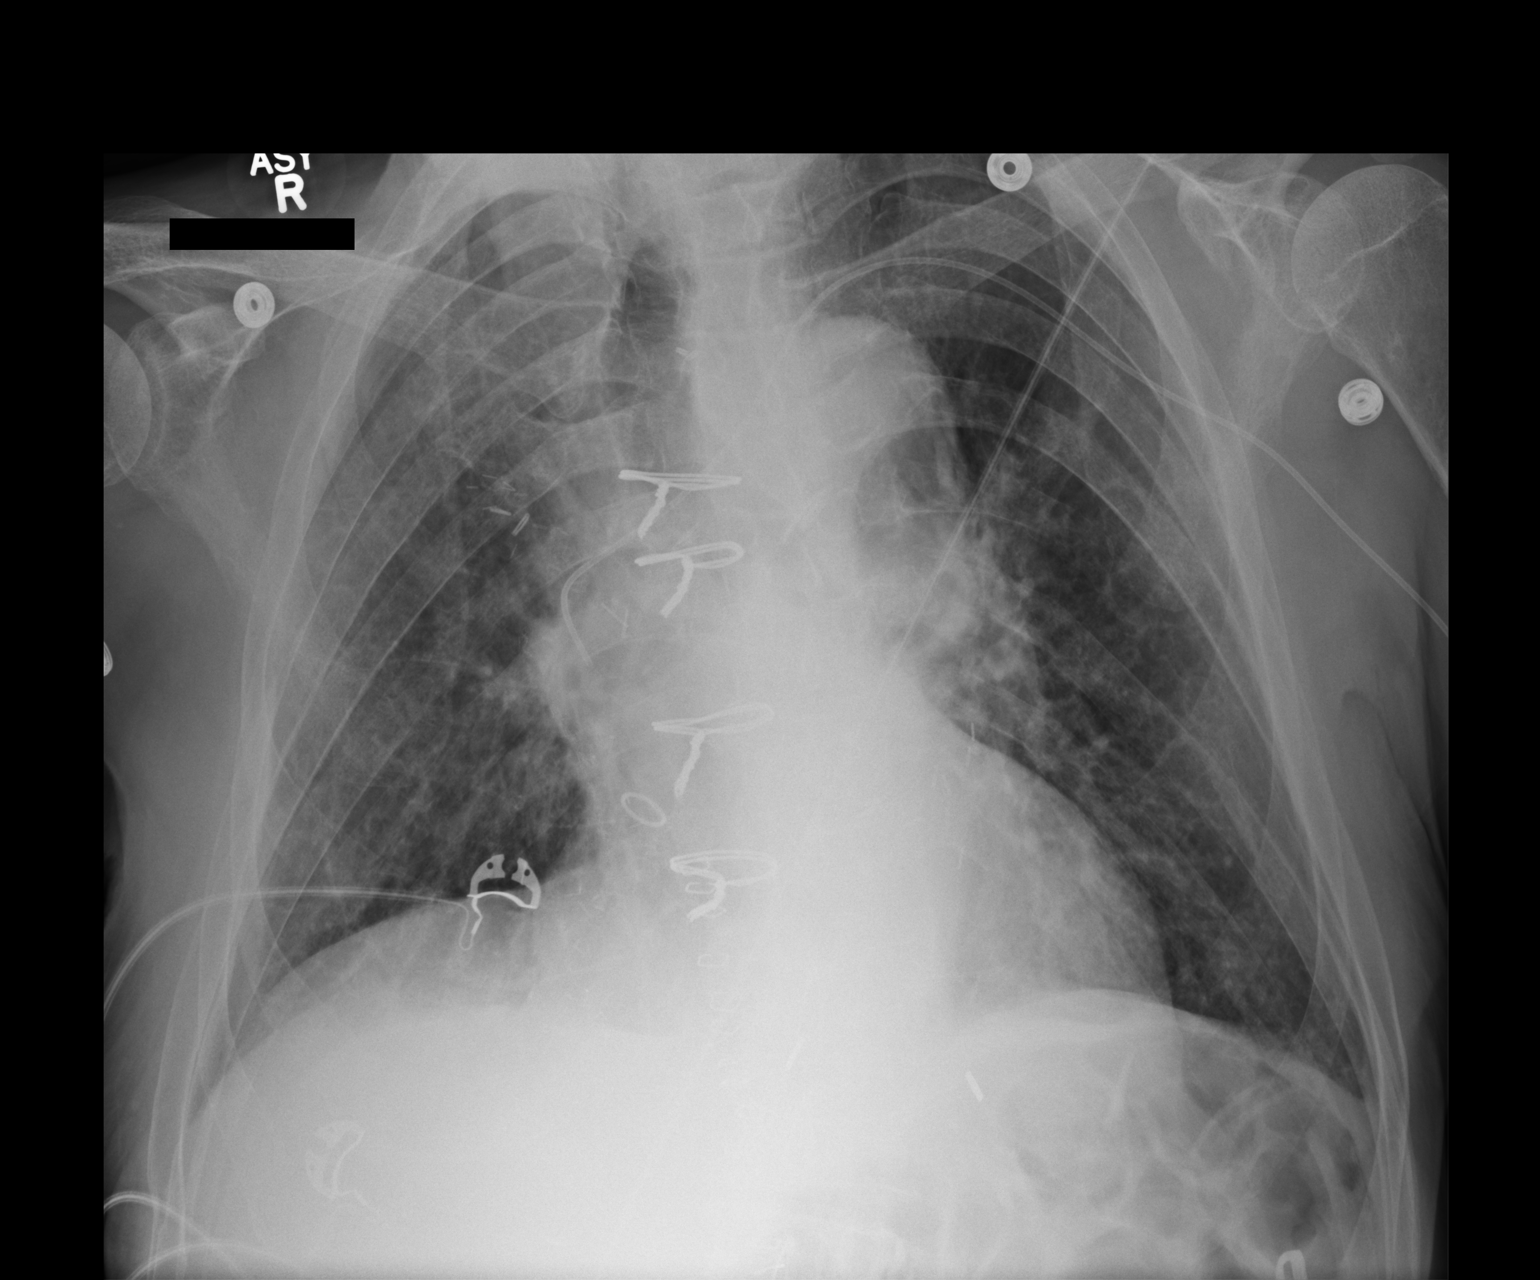

[1 of 1 positions shown; findings below may reference images not displayed]

FINDINGS: PICC line tip in the upper SVC region. The previously described left
upper lobe opacity is less conspicuous on today's examination. Again
noted are prominent interstitial markings bilaterally. Heart size is
stable. Median sternotomy wires are present. Patient is mildly
rotated on this examination.
IMPRESSION: Slightly improved aeration in the left upper lung.

## 2015-03-20 ENCOUNTER — Other Ambulatory Visit: Payer: Self-pay | Admitting: *Deleted

## 2015-03-20 DIAGNOSIS — C3491 Malignant neoplasm of unspecified part of right bronchus or lung: Secondary | ICD-10-CM

## 2015-04-09 ENCOUNTER — Encounter: Payer: Self-pay | Admitting: Thoracic Surgery (Cardiothoracic Vascular Surgery)

## 2015-04-09 ENCOUNTER — Ambulatory Visit (INDEPENDENT_AMBULATORY_CARE_PROVIDER_SITE_OTHER): Payer: Medicare Other | Admitting: Thoracic Surgery (Cardiothoracic Vascular Surgery)

## 2015-04-09 ENCOUNTER — Ambulatory Visit
Admission: RE | Admit: 2015-04-09 | Discharge: 2015-04-09 | Disposition: A | Discharge status: Home / Self Care | Payer: Medicare Other | Source: Ambulatory Visit | Attending: Thoracic Surgery (Cardiothoracic Vascular Surgery) | Admitting: Thoracic Surgery (Cardiothoracic Vascular Surgery)

## 2015-04-09 VITALS — BP 185/103 | HR 62 | Resp 16 | Ht 72.0 in | Wt 178.0 lb

## 2015-04-09 DIAGNOSIS — C3491 Malignant neoplasm of unspecified part of right bronchus or lung: Secondary | ICD-10-CM

## 2015-04-09 DIAGNOSIS — C3411 Malignant neoplasm of upper lobe, right bronchus or lung: Secondary | ICD-10-CM | POA: Diagnosis not present

## 2015-04-09 DIAGNOSIS — Z902 Acquired absence of lung [part of]: Secondary | ICD-10-CM | POA: Diagnosis not present

## 2015-04-09 NOTE — Progress Notes (Signed)
[address]       [place] [address]             [phone]       HPI: Mr. Stockman returns today for a scheduled 2 year follow-up visit.  He is a 75 year old man who underwent a combined coronary bypass grafting 1 and right upper lobectomy via a median sternotomy in January 2015. His postoperative course was complicated by prolonged air leak and a sternal wound infection requiring a flap.  He was last seen in the office in July 2016 for his 18 month follow-up visit. He had no evidence recurrent disease.  He saw Dr. Johnsie Cancel in March. He had a normal stress test in April.   He still has occasional twinges of pain in the lower portion of his chest incision. He denies anginal-type pain. His breathing has not changed since his last visit. He has been having a lot of pain in his back and his knees. He said a few weeks ago he was having particularly bad pain in his back. He is followed by a pain clinic for that and gets injections periodically. He has not had any unusual headaches or visual changes. His appetite is good. His weight is stable.  Over the holidays his brother was diagnosed with lung cancer and died about 10 days later. This is been very significant emotional stress on Mr. Teska.  Past Medical History  Diagnosis Date  . CAD (coronary artery disease)   . HTN (hypertension)   . COPD (chronic obstructive pulmonary disease) ([place])   . Hyperlipidemia   . Depression   . Vitamin D deficiency   . Sleep apnea   . Anginal pain ([place])   . Shortness of breath   . Arthritis   . Polycystic kidney disease   . Non-small cell cancer of right lung [facility]) January 2015    Right upper lobectomy, stage IA     Current Outpatient Prescriptions  Medication Sig Dispense Refill  . aspirin 325 MG EC tablet Take 325 mg by mouth daily as needed (angina).    . Cholecalciferol (VITAMIN D3) 50000 UNITS TABS Take by mouth every 7 (seven) weeks. Takes every Saturday    . diazepam  (VALIUM) 10 MG tablet Take 10 mg by mouth 2 (two) times daily as needed for anxiety (for back spasms).    . furosemide (LASIX) 40 MG tablet Take 1 tablet (40 mg total) by mouth daily. (Patient taking differently: Take 40 mg by mouth daily. PRN) 30 tablet 1  . guaiFENesin (MUCINEX) 600 MG 12 hr tablet Take 600 mg by mouth 2 (two) times daily as needed for cough or to loosen phlegm.    Marland Kitchen HYDROcodone-acetaminophen (NORCO) 10-325 MG per tablet Take 1 tablet by mouth every 6 (six) hours as needed.    Marland Kitchen ibuprofen (ADVIL,MOTRIN) 200 MG tablet Take 200 mg by mouth daily as needed for mild pain.     . metoprolol succinate (TOPROL XL) 25 MG 24 hr tablet Take 1 tablet (25 mg total) by mouth daily. 30 tablet 5  . montelukast (SINGULAIR) 10 MG tablet Take 10 mg by mouth daily.     . traMADol (ULTRAM) 50 MG tablet Take 50 mg by mouth 3 (three) times daily.     . vitamin B-12 (CYANOCOBALAMIN) 1000 MCG tablet Take 1,000 mcg by mouth daily.     No current facility-administered medications for this visit.   Facility-Administered Medications Ordered in Other Visits  Medication Dose Route Frequency Provider Last Rate Last Dose  . chlorhexidine (HIBICLENS) 4 % liquid 2 application  30 mL Topical UD Melrose Nakayama, MD        Physical Exam BP 185/103 mmHg  Pulse 62  Resp 16  Ht 6' (1.829 m)  Wt 178 lb (80.74 kg)  BMI 24.14 kg/m2  SpO41 77% 75 year old man in no acute distress Alert and oriented 3 with no focal deficits No cervical or supraclavicular adenopathy Cardiac regular rate and rhythm normal S1 and S2 Sternal incision intact Lungs with diminished breath sounds bilaterally right greater than left  Diagnostic Tests: CT CHEST WITHOUT CONTRAST  TECHNIQUE: Multidetector CT imaging of the chest was performed following the standard protocol without IV contrast.  COMPARISON: 10/16/2014  FINDINGS: Again noted status post median sternotomy. Atherosclerotic calcifications of thoracic aorta  and coronary arteries. Again noted status post CABG. Stable postsurgical changes GE junction region.  There is bilateral nonobstructive nephrolithiasis. Bilateral renal cysts are again noted. Postcholecystectomy surgical clips are noted.  Sagittal images of the spine shows diffuse osteopenia. There is stable mild compression deformity lower endplate of T7 vertebral body. There is new moderate compression deformity of T8 vertebral body of indeterminate age. Question subacute fracture as seen in coronal image 88. Clinical correlation is necessary. Further correlation with MRI could be performed as clinically warranted. Stable compression deformity upper endplate of L1 vertebral body. Stable degenerative changes lower thoracic spine.  Again noted paraseptal emphysematous changes bilaterally left greater than right. Again noted partial right upper lobectomy. There is no evidence of recurrent tumor. Stable pleural based scarring in left lower laterally.  No mediastinal or hilar adenopathy. Central airways are patent.  IMPRESSION: 1. Again noted status post partial right upper lobectomy. No evidence of recurrent tumor or metastatic disease. 2. No mediastinal hematoma or adenopathy. 3. Again noted paraseptal emphysematous changes bilaterally left greater than right. 4. Status post median sternotomy. Atherosclerotic calcifications of thoracic aorta and coronary arteries. Status post CABG. 5. Stable T7 lower endplate compression deformity. Stable compression deformity upper endplate of L1. There is interval new moderate compression fracture T8 vertebral body of indeterminate age. Subacute fracture cannot be excluded as there is subtle fracture line noted in coronal image 88. Further correlation with MRI could be performed as clinically warranted. No bony protrusion in spinal canal. The spinal canal is patent. 6. Bilateral renal cysts are again noted. Bilateral  nonobstructive nephrolithiasis. Status postcholecystectomy.   Electronically Signed  By: Lahoma Crocker M.D.  On: 04/09/2015 14:24 I personally reviewed the CT chest to concur with the findings as noted above. There is no evidence of recurrent disease.  Impression: Mr. Stamey is a 75 year old gentleman who had a combined coronary bypass grafting 1 and right upper lobectomy for single-vessel coronary disease and a stage IA non-small cell carcinoma 2 years ago.   Lung cancer - He is now 2 years out from surgery with no evidence of recurrent disease. He will need annual CT of the chest for the next 3 years.  CAD- 2 years out from right mammary to right coronary bypass grafting. He is followed by Dr. Jenkins Rouge. He is not having any anginal pain at this time  Hypertension- his blood pressure was markedly elevated day 185/103 on recheck it was 174/102. I recommended that he come by his primary care office tomorrow and his blood pressure rechecked. He lives in [place] so was not convenient for him to come by our office for that.  Back  pain- he is followed by a pain clinic for that. I did inform them the findings of the vertebral compression deformities on CT. He should talk to his primary about that as well.  Plan: Return in one year with CT chest for three-year follow-up visit  Melrose Nakayama, MD [facility] [phone]

## 2016-03-19 ENCOUNTER — Other Ambulatory Visit: Payer: Self-pay | Admitting: *Deleted

## 2016-03-19 DIAGNOSIS — C3491 Malignant neoplasm of unspecified part of right bronchus or lung: Secondary | ICD-10-CM

## 2016-04-14 ENCOUNTER — Ambulatory Visit: Payer: Medicare Other | Admitting: Thoracic Surgery (Cardiothoracic Vascular Surgery)

## 2016-04-14 ENCOUNTER — Other Ambulatory Visit: Payer: Medicare Other

## 2016-04-28 ENCOUNTER — Ambulatory Visit (INDEPENDENT_AMBULATORY_CARE_PROVIDER_SITE_OTHER): Payer: Medicare Other | Admitting: Thoracic Surgery (Cardiothoracic Vascular Surgery)

## 2016-04-28 ENCOUNTER — Ambulatory Visit
Admission: RE | Admit: 2016-04-28 | Discharge: 2016-04-28 | Disposition: A | Discharge status: Home / Self Care | Payer: Medicare Other | Source: Ambulatory Visit | Attending: Thoracic Surgery (Cardiothoracic Vascular Surgery) | Admitting: Thoracic Surgery (Cardiothoracic Vascular Surgery)

## 2016-04-28 ENCOUNTER — Encounter: Payer: Self-pay | Admitting: Thoracic Surgery (Cardiothoracic Vascular Surgery)

## 2016-04-28 VITALS — BP 152/73 | HR 55 | Resp 16 | Ht 72.0 in | Wt 157.2 lb

## 2016-04-28 DIAGNOSIS — C3411 Malignant neoplasm of upper lobe, right bronchus or lung: Secondary | ICD-10-CM

## 2016-04-28 DIAGNOSIS — C3491 Malignant neoplasm of unspecified part of right bronchus or lung: Secondary | ICD-10-CM

## 2016-04-28 DIAGNOSIS — Z902 Acquired absence of lung [part of]: Secondary | ICD-10-CM

## 2016-04-28 NOTE — Progress Notes (Signed)
[address]       [place] [address]             [phone]       HPI: Troy [name] returns for 3 year follow-up visit.  Troy [name] is a 76 year old man who had combined coronary bypass grafting 1 and right upper lobectomy in January 2015. His postoperative course was, complicated by prolonged air leak which led to a sternal wound infection and required a rectus flap. I last saw him in the office January 2017. He was doing well that time with no cardiac issues and no evidence of recurrent disease at 2 years.  Over the past year his continued to do well. He is not really having pain. He does have some hernias along the abdominal wall. He wonders if she should get those repaired. He continues to see a pain clinic for back pain. He has not had any anginal-type chest pain. He has not been using his Spiriva. He has not noted any wheezing. He does sometimes feel short of breath when he first lies down flat on his back. Usually if he sits up he can then likely down again without any significant issues as long as he goes slowly. His appetite is unchanged. He denies any significant weight loss.  Past Medical History:  Diagnosis Date  . Anginal pain ([place])   . Arthritis   . CAD (coronary artery disease)   . COPD (chronic obstructive pulmonary disease) ([place])   . Depression   . HTN (hypertension)   . Hyperlipidemia   . Non-small cell cancer of right lung [facility]) January 2015   Right upper lobectomy, stage IA  . Polycystic kidney disease   . Shortness of breath   . Sleep apnea   . Vitamin D deficiency       Current Outpatient Prescriptions  Medication Sig Dispense Refill  . aspirin 325 MG EC tablet Take 325 mg by mouth daily as needed (angina).    . Cholecalciferol (VITAMIN D3) 50000 UNITS TABS Take by mouth every 7 (seven) weeks. Takes every Saturday    . furosemide (LASIX) 40 MG tablet Take 1 tablet (40 mg total) by mouth daily. (Patient taking differently: Take 40 mg by  mouth daily. PRN) 30 tablet 1  . gabapentin (NEURONTIN) 300 MG capsule Take 300 mg by mouth 3 (three) times daily.    Marland Kitchen guaiFENesin (MUCINEX) 600 MG 12 hr tablet Take 600 mg by mouth 2 (two) times daily as needed for cough or to loosen phlegm.    Marland Kitchen HYDROcodone-acetaminophen (NORCO) 10-325 MG per tablet Take 1 tablet by mouth every 6 (six) hours as needed.    Marland Kitchen ibuprofen (ADVIL,MOTRIN) 200 MG tablet Take 200 mg by mouth daily as needed for mild pain.     Marland Kitchen lisinopril (PRINIVIL,ZESTRIL) 10 MG tablet Take 10 mg by mouth daily.    . metoprolol succinate (TOPROL XL) 25 MG 24 hr tablet Take 1 tablet (25 mg total) by mouth daily. (Patient taking differently: Take 50 mg by mouth daily. ) 30 tablet 5  . montelukast (SINGULAIR) 10 MG tablet Take 10 mg by mouth daily.     . traMADol (ULTRAM) 50 MG tablet Take 50 mg by mouth 3 (three) times daily.     . vitamin B-12 (CYANOCOBALAMIN) 1000 MCG tablet Take 1,000 mcg by mouth daily.     No current facility-administered medications for this visit.    Facility-Administered Medications Ordered in Other Visits  Medication Dose Route Frequency Provider Last Rate Last Dose  . chlorhexidine (HIBICLENS) 4 % liquid 2 application  30 mL Topical UD Melrose Nakayama, MD        Physical Exam BP (!) 152/73 (BP Location: Left Arm, Patient Position: Sitting, Cuff Size: Normal)   Pulse (!) 55   Resp 16   Ht 6' (1.829 m)   Wt 157 lb 3.2 oz (71.3 kg)   SpO2 97% Comment: ON RA  BMI 21.68 kg/m  76 year old man in no acute distress. Appears older than stated age. Alert and oriented 3 with no focal neurologic deficits Sternal incision well-healed, wires easily palpable Cardiac regular rate and rhythm with an S4, no murmur Lungs diminished right base, otherwise clear. Abdomen multiple well-healed scars multiple reducible hernias  Diagnostic Tests: CT CHEST WITHOUT CONTRAST  TECHNIQUE: Multidetector CT imaging of the chest was performed following the standard  protocol without IV contrast.  COMPARISON:  04/09/2015 chest CT.  FINDINGS: Cardiovascular: Stable mild cardiomegaly. No significant pericardial fluid/thickening. Left main, left anterior descending, left circumflex and right coronary atherosclerosis status post CABG. Atherosclerotic nonaneurysmal thoracic aorta. Stable top-normal caliber main pulmonary artery (3.1 cm diameter).  Mediastinum/Nodes: No discrete thyroid nodules. Unremarkable esophagus. No pathologically enlarged axillary, mediastinal or gross hilar lymph nodes, noting limited sensitivity for the detection of hilar adenopathy on this noncontrast study.  Lungs/Pleura: No pneumothorax. No pleural effusion. Status post right upper lobectomy. Mild paraseptal emphysema with mild diffuse bronchial wall thickening. Solid 4 mm left lower lobe pulmonary nodule (series 4/ image 99) is stable back to the 02/28/2013 and considered benign. No acute consolidative airspace disease, lung masses or new significant pulmonary nodules. Stable mild scarring at the lung bases.  Upper abdomen: Stable partially visualized postsurgical changes from apparent Nissen fundoplication and distal gastrectomy, with stable small recurrent hiatal hernia. Cholecystectomy. There are 3 hyperdense renal cortical lesions in the upper right kidney, largest 1.9 cm, unchanged since 04/09/2015, suggesting benign hemorrhagic/proteinaceous renal cysts. Mildly complex 5.1 cm upper left renal cyst with focal mural calcification, not appreciably changed. Additional simple appearing renal cysts in the upper kidneys bilaterally.  Musculoskeletal: No aggressive appearing focal osseous lesions. Moderate thoracic spondylosis. Stable chronic mild T7, moderate T8, mild T12 and moderate L1 vertebral compression fractures.  IMPRESSION: 1. No evidence of local tumor recurrence in the right lung status post right upper lobectomy . 2. No evidence of metastatic  disease in the chest. 3. Stable mild cardiomegaly. 4. Aortic atherosclerosis. Left main and 3 vessel coronary atherosclerosis status post CABG. 5. Mild paraseptal emphysema with mild diffuse bronchial wall thickening, suggesting COPD. 6. Stable chronic thoracolumbar vertebral compression fractures as detailed.   Electronically Signed   By: Ilona Sorrel M.D.   On: 04/28/2016 14:32 I personally reviewed the CT chest and concur with the findings noted above.  Impression: 76 year old man who is now 3 years out from a combined right upper lobectomy and coronary bypass grafting 1.   CAD - He currently is doing well from a cardiac standpoint with no angina. He sometimes feels short of breath when he first lays down which could be orthopnea, but he then can tolerate laying flat on his back as long as he goes slowly. He does not have any obvious volume overloaded on exam.  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 Status post right upper lobectomy in 2015. He is now 3 years out with no evidence recurrent disease.  Smoking - quit 14 years  ago   Incisional hernias - I advised him to check with a general surgeon in his local area about whether to fix those.  Plan: I will see Troy [name] back in one year for a 4 year follow-up visit. We will do a CT of the chest.  Melrose Nakayama, MD [facility] [phone]

## 2017-01-05 ENCOUNTER — Ambulatory Visit: Payer: Medicare Other | Admitting: Cardiovascular Disease

## 2017-04-09 ENCOUNTER — Other Ambulatory Visit: Payer: Self-pay | Admitting: Thoracic Surgery (Cardiothoracic Vascular Surgery)

## 2017-04-09 DIAGNOSIS — C3491 Malignant neoplasm of unspecified part of right bronchus or lung: Secondary | ICD-10-CM

## 2017-05-04 ENCOUNTER — Other Ambulatory Visit: Payer: Medicare Other

## 2017-05-04 ENCOUNTER — Ambulatory Visit: Payer: Medicare Other | Admitting: Thoracic Surgery (Cardiothoracic Vascular Surgery)

## 2017-06-15 ENCOUNTER — Other Ambulatory Visit: Payer: Self-pay

## 2017-06-15 ENCOUNTER — Ambulatory Visit (INDEPENDENT_AMBULATORY_CARE_PROVIDER_SITE_OTHER): Payer: Medicare Other | Admitting: Thoracic Surgery (Cardiothoracic Vascular Surgery)

## 2017-06-15 ENCOUNTER — Encounter: Payer: Self-pay | Admitting: Thoracic Surgery (Cardiothoracic Vascular Surgery)

## 2017-06-15 VITALS — BP 166/93 | HR 68 | Resp 16 | Ht 72.0 in | Wt 147.0 lb

## 2017-06-15 DIAGNOSIS — C3491 Malignant neoplasm of unspecified part of right bronchus or lung: Secondary | ICD-10-CM

## 2017-06-15 DIAGNOSIS — Z902 Acquired absence of lung [part of]: Secondary | ICD-10-CM

## 2017-06-15 NOTE — Progress Notes (Signed)
[address]       ,[place] [address]             [phone]    HPI: Mr. Rames returns for a scheduled follow-up visit  Nikolus Marczak is a 77 year old man who had a combined coronary bypass grafting x1 and right upper lobectomy in January 2015.  He had a stage IA tumor.  His postoperative course was complicated by a prolonged air leak.  He developed a sternal wound infection and ultimately required a rectus flap.  I have been following him since then.  Last saw him in the office in February 2018.  He was doing well at that time with no evidence of recurrent disease.  In the interim since his last visit he was hospitalized up in [place] with a gastric perforation.  He required emergency surgery.  He says he is just now getting over that.  He quit smoking in 2004.  Past Medical History:  Diagnosis Date  . Anginal pain ([place])   . Arthritis   . CAD (coronary artery disease)   . COPD (chronic obstructive pulmonary disease) ([place])   . Depression   . HTN (hypertension)   . Hyperlipidemia   . Non-small cell cancer of right lung [facility]) January 2015   Right upper lobectomy, stage IA  . Polycystic kidney disease   . Shortness of breath   . Sleep apnea   . Vitamin D deficiency     Current Outpatient Medications  Medication Sig Dispense Refill  . aspirin 325 MG EC tablet Take 325 mg by mouth daily as needed (angina).    . Cholecalciferol (VITAMIN D3) 50000 UNITS TABS Take by mouth every 7 (seven) weeks. Takes every Saturday    . guaiFENesin (MUCINEX) 600 MG 12 hr tablet Take 600 mg by mouth 2 (two) times daily as needed for cough or to loosen phlegm.    Marland Kitchen HYDROcodone-acetaminophen (NORCO) 10-325 MG per tablet Take 1 tablet by mouth every 6 (six) hours as needed.    Marland Kitchen ibuprofen (ADVIL,MOTRIN) 200 MG tablet Take 200 mg by mouth daily as needed for mild pain.     . metoprolol succinate (TOPROL XL) 25 MG 24 hr tablet Take 1 tablet (25 mg total) by mouth daily. (Patient taking  differently: Take 50 mg by mouth daily. ) 30 tablet 5  . montelukast (SINGULAIR) 10 MG tablet Take 10 mg by mouth daily.     . Prenatal Vit-Fe Fumarate-FA (M-VIT) tablet Take 1 tablet by mouth daily.    . traMADol (ULTRAM) 50 MG tablet Take 50 mg by mouth 3 (three) times daily.     . vitamin B-12 (CYANOCOBALAMIN) 1000 MCG tablet Take 1,000 mcg by mouth daily.     No current facility-administered medications for this visit.    Facility-Administered Medications Ordered in Other Visits  Medication Dose Route Frequency Provider Last Rate Last Dose  . chlorhexidine (HIBICLENS) 4 % liquid 2 application  30 mL Topical UD Melrose Nakayama, MD        Physical Exam BP (!) 166/93 (BP Location: Left Arm, Patient Position: Sitting, Cuff Size: Large)   Pulse 68   Resp 16   Ht 6' (1.829 m)   Wt 147 lb (66.7 kg)   SpO2 96% Comment: RA  BMI 19.55 kg/m  77 year old man in no acute distress Alert and oriented x3 with no focal deficits No cervical or supra clavicular adenopathy Lungs with diminished breath sounds bilaterally, no rales  or wheezing Cardiac regular rate and rhythm with a 2/6 systolic murmur  Diagnostic Tests:  I reviewed the CT chest done at [facility] hospital on 05/17/2017.  By my reading and by the official report there was no evidence of recurrent disease.  There was some pleural-parenchymal scarring and emphysema.  Impression: Mr. Iodice is a 77 year old gentleman who underwent a combined coronary bypass grafting x1 and right upper lobectomy in January 2015.  He had a complicated postoperative course with a prolonged air leak.  That led to a sternal infection which necessitated closure with a rectus flap.  Since then he is done well from both a cardiac and oncology perspective.  CAD-status post coronary bypass grafting x1 back in 2015.  No anginal symptoms at present.  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  Status post right upper  lobectomy in 2015.  Did not require adjuvant therapy.  Now 4 years out with no evidence of recurrent disease.  Tobacco abuse-quit smoking in 2004.  Plan: Return in 1 year with CT chest for 5-year follow-up  Melrose Nakayama, MD [facility] [phone]

## 2018-04-21 ENCOUNTER — Other Ambulatory Visit: Payer: Self-pay | Admitting: Thoracic Surgery (Cardiothoracic Vascular Surgery)

## 2018-04-21 DIAGNOSIS — C3490 Malignant neoplasm of unspecified part of unspecified bronchus or lung: Secondary | ICD-10-CM

## 2018-06-10 ENCOUNTER — Other Ambulatory Visit: Payer: Medicare Other

## 2018-06-14 ENCOUNTER — Other Ambulatory Visit: Payer: Medicare Other

## 2018-06-14 ENCOUNTER — Ambulatory Visit: Payer: Medicare Other | Admitting: Thoracic Surgery (Cardiothoracic Vascular Surgery)
